# Patient Record
Sex: Female | Born: 1960 | Race: White | Hispanic: No | Smoking: Current every day smoker
Health system: Southern US, Community
[De-identification: ages and names within clinical notes are randomized; demographics above are authoritative.]

## PROBLEM LIST (undated history)

## (undated) DIAGNOSIS — D497 Neoplasm of unspecified behavior of endocrine glands and other parts of nervous system: Secondary | ICD-10-CM

## (undated) DIAGNOSIS — J449 Chronic obstructive pulmonary disease, unspecified: Secondary | ICD-10-CM

## (undated) DIAGNOSIS — E782 Mixed hyperlipidemia: Secondary | ICD-10-CM

## (undated) DIAGNOSIS — G4730 Sleep apnea, unspecified: Secondary | ICD-10-CM

## (undated) DIAGNOSIS — I10 Essential (primary) hypertension: Secondary | ICD-10-CM

## (undated) DIAGNOSIS — F319 Bipolar disorder, unspecified: Secondary | ICD-10-CM

## (undated) DIAGNOSIS — K589 Irritable bowel syndrome without diarrhea: Secondary | ICD-10-CM

## (undated) DIAGNOSIS — F4310 Post-traumatic stress disorder, unspecified: Secondary | ICD-10-CM

## (undated) DIAGNOSIS — C649 Malignant neoplasm of unspecified kidney, except renal pelvis: Secondary | ICD-10-CM

## (undated) DIAGNOSIS — C539 Malignant neoplasm of cervix uteri, unspecified: Secondary | ICD-10-CM

## (undated) DIAGNOSIS — E119 Type 2 diabetes mellitus without complications: Secondary | ICD-10-CM

## (undated) DIAGNOSIS — K219 Gastro-esophageal reflux disease without esophagitis: Secondary | ICD-10-CM

## (undated) HISTORY — PX: CHOLECYSTECTOMY: SHX55

## (undated) HISTORY — DX: Essential (primary) hypertension: I10

## (undated) HISTORY — DX: Sleep apnea, unspecified: G47.30

## (undated) HISTORY — PX: BACK SURGERY: SHX140

## (undated) HISTORY — PX: ADRENAL GLAND SURGERY: SHX544

## (undated) HISTORY — DX: Chronic obstructive pulmonary disease, unspecified: J44.9

## (undated) HISTORY — PX: SHOULDER SURGERY: SHX246

## (undated) HISTORY — DX: Malignant neoplasm of unspecified kidney, except renal pelvis: C64.9

## (undated) HISTORY — DX: Irritable bowel syndrome, unspecified: K58.9

## (undated) HISTORY — DX: Mixed hyperlipidemia: E78.2

## (undated) HISTORY — DX: Type 2 diabetes mellitus without complications: E11.9

## (undated) HISTORY — PX: NECK SURGERY: SHX720

## (undated) HISTORY — DX: Neoplasm of unspecified behavior of endocrine glands and other parts of nervous system: D49.7

## (undated) HISTORY — PX: TUBAL LIGATION: SHX77

## (undated) HISTORY — PX: OTHER SURGICAL HISTORY: SHX169

## (undated) HISTORY — DX: Post-traumatic stress disorder, unspecified: F43.10

## (undated) HISTORY — DX: Bipolar disorder, unspecified: F31.9

## (undated) HISTORY — DX: Gastro-esophageal reflux disease without esophagitis: K21.9

## (undated) HISTORY — PX: CERVICAL CONE BIOPSY: SUR198

---
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
## 1985-10-10 DIAGNOSIS — C539 Malignant neoplasm of cervix uteri, unspecified: Secondary | ICD-10-CM

## 1985-10-10 HISTORY — DX: Malignant neoplasm of cervix uteri, unspecified: C53.9

## 1998-04-05 ENCOUNTER — Ambulatory Visit (HOSPITAL_COMMUNITY): Admission: RE | Admit: 1998-04-05 | Discharge: 1998-04-05 | Payer: Self-pay | Admitting: Neurosurgery

## 1998-04-21 ENCOUNTER — Ambulatory Visit (HOSPITAL_COMMUNITY): Admission: RE | Admit: 1998-04-21 | Discharge: 1998-04-21 | Payer: Self-pay | Admitting: Neurosurgery

## 1998-07-21 ENCOUNTER — Inpatient Hospital Stay (HOSPITAL_COMMUNITY): Admission: RE | Admit: 1998-07-21 | Discharge: 1998-07-24 | Payer: Self-pay | Admitting: Neurosurgery

## 1998-07-21 ENCOUNTER — Encounter: Payer: Self-pay | Admitting: Neurosurgery

## 2003-05-23 ENCOUNTER — Ambulatory Visit (HOSPITAL_COMMUNITY): Admission: RE | Admit: 2003-05-23 | Discharge: 2003-05-23 | Payer: Self-pay | Admitting: Family Medicine

## 2007-04-05 ENCOUNTER — Ambulatory Visit (HOSPITAL_COMMUNITY): Admission: RE | Admit: 2007-04-05 | Discharge: 2007-04-05 | Payer: Self-pay | Admitting: Family Medicine

## 2009-01-13 ENCOUNTER — Ambulatory Visit (HOSPITAL_COMMUNITY): Admission: RE | Admit: 2009-01-13 | Discharge: 2009-01-13 | Payer: Self-pay | Admitting: Family Medicine

## 2009-05-26 ENCOUNTER — Emergency Department (HOSPITAL_COMMUNITY): Admission: EM | Admit: 2009-05-26 | Discharge: 2009-05-26 | Payer: Self-pay | Admitting: Emergency Medicine

## 2010-01-07 ENCOUNTER — Ambulatory Visit: Payer: Self-pay | Admitting: Cardiology

## 2010-05-31 ENCOUNTER — Ambulatory Visit: Admission: RE | Admit: 2010-05-31 | Discharge: 2010-05-31 | Payer: Self-pay | Admitting: Neurology

## 2011-01-15 LAB — RAPID URINE DRUG SCREEN, HOSP PERFORMED
Amphetamines: NOT DETECTED
Barbiturates: NOT DETECTED
Opiates: NOT DETECTED
Tetrahydrocannabinol: NOT DETECTED

## 2011-01-15 LAB — BASIC METABOLIC PANEL
BUN: 5 mg/dL — ABNORMAL LOW (ref 6–23)
CO2: 29 mEq/L (ref 19–32)
Calcium: 9.6 mg/dL (ref 8.4–10.5)
Chloride: 102 mEq/L (ref 96–112)
Creatinine, Ser: 0.82 mg/dL (ref 0.4–1.2)
GFR calc Af Amer: >60 mL/min (ref >60)
GFR calc non Af Amer: >60 mL/min (ref >60)
Glucose, Bld: 95 mg/dL (ref 70–99)
Potassium: 3.3 mEq/L — ABNORMAL LOW (ref 3.5–5.1)
Sodium: 137 mEq/L (ref 135–145)

## 2011-01-15 LAB — CBC
HCT: 40.9 % (ref 36.0–46.0)
MCHC: 34.5 g/dL (ref 30.0–36.0)
MCV: 87.5 fL (ref 78.0–100.0)
Platelets: 295 10*3/uL (ref 150–400)

## 2011-01-15 LAB — DIFFERENTIAL
Basophils Relative: 0 % (ref 0–1)
Eosinophils Absolute: 0.2 10*3/uL (ref 0.0–0.7)
Eosinophils Relative: 2 % (ref 0–5)
Monocytes Relative: 5 % (ref 3–12)
Neutrophils Relative %: 67 % (ref 43–77)

## 2011-01-15 LAB — ETHANOL: Alcohol, Ethyl (B): <5 mg/dL (ref 0–10)

## 2011-06-27 ENCOUNTER — Ambulatory Visit: Payer: Medicaid Other | Attending: Neurology | Admitting: Sleep Medicine

## 2011-06-27 DIAGNOSIS — G4710 Hypersomnia, unspecified: Secondary | ICD-10-CM | POA: Insufficient documentation

## 2011-06-27 DIAGNOSIS — G4730 Sleep apnea, unspecified: Secondary | ICD-10-CM | POA: Insufficient documentation

## 2011-06-27 DIAGNOSIS — Z6841 Body Mass Index (BMI) 40.0 and over, adult: Secondary | ICD-10-CM | POA: Insufficient documentation

## 2011-07-03 NOTE — Procedures (Signed)
NAME:  Kimberly Green, Kimberly Green            ACCOUNT NO.:  [id number]  MEDICAL RECORD NO.:  [id number]          PATIENT TYPE:  OUT  LOCATION:  SLEEP LAB                     FACILITY:  [facility]  PHYSICIAN:  Mellony Danziger A. Gerilyn Pilgrim, M.D. DATE OF BIRTH:  1961-09-27  DATE OF STUDY:                           NOCTURNAL POLYSOMNOGRAM  REFERRING PHYSICIAN:  Kmya Placide A. Gerilyn Pilgrim, M.D.  INDICATIONS:  A 50 year old lady who presents with hypersomnia, fatigue, snoring and difficulty sleeping.   EPWORTH SLEEPINESS SCORE: 1. BMI 43.  ARCHITECTURAL SUMMARY:  The total recording time is 419.5 minutes. Sleep efficiency 79.6%, sleep latency 41 minutes, REM latency 351.5 minutes.  Stage N1 19.9%, N2 73.4%, N3 0% and REM sleep 6.7%.  RESPIRATORY SUMMARY:  The patient had significantly elevated average heart rate 6 bpm throughout the study with a average heart rate of 34. Baseline oxygen saturation 95, lowest saturation 85 during REM sleep. Diagnostic AHI 2.3 and RDI 2.7.  LIMB MOVEMENT SUMMARY:  PLM index 0.  ELECTROCARDIOGRAM SUMMARY:  Average heart rate is 90 with no significant dysrhythmias observed.  MEDICATIONS:  Clonazepam, Advil, Prozac, albuterol, potassium, hydrochlorothiazide, pravastatin, lisinopril, Protonix, Flovent, Seroquel, spiriva and Invega.  IMPRESSIONS-RECOMMENDATIONS:  Tachypnea seen throughout the study along with increased heart rate.  Both are seen throughout the recording and are of unclear etiology.  Potential causes could be medication effect or underlying dysautonomia.  Otherwise, no sleep disorders uncovered during this recording.     Hadiyah Maricle A. Gerilyn Pilgrim, M.D.    KAD/MEDQ  D:  07/02/2011 22:35:54  T:  07/03/2011 07:01:39  Job:  [id number]

## 2011-07-05 ENCOUNTER — Encounter: Payer: Self-pay | Admitting: *Deleted

## 2011-07-06 ENCOUNTER — Encounter: Payer: Self-pay | Admitting: *Deleted

## 2011-07-06 ENCOUNTER — Ambulatory Visit (INDEPENDENT_AMBULATORY_CARE_PROVIDER_SITE_OTHER): Payer: Medicaid Other | Admitting: Physician Assistant

## 2011-07-06 ENCOUNTER — Other Ambulatory Visit: Payer: Self-pay | Admitting: Cardiology

## 2011-07-06 ENCOUNTER — Encounter: Payer: Self-pay | Admitting: Cardiology

## 2011-07-06 ENCOUNTER — Telehealth: Payer: Self-pay | Admitting: *Deleted

## 2011-07-06 VITALS — BP 108/67 | HR 91 | Ht 64.0 in | Wt 246.0 lb

## 2011-07-06 DIAGNOSIS — R002 Palpitations: Secondary | ICD-10-CM

## 2011-07-06 DIAGNOSIS — R079 Chest pain, unspecified: Secondary | ICD-10-CM | POA: Insufficient documentation

## 2011-07-06 DIAGNOSIS — R0609 Other forms of dyspnea: Secondary | ICD-10-CM | POA: Insufficient documentation

## 2011-07-06 NOTE — Progress Notes (Signed)
Patient seen and examined with [name]. She reports a history of dyspnea on exertion as well as occasional exertional chest pain, also palpitations, as described below. Cardiac risk factors include obesity, long-standing tobacco abuse, hypertension, hyperlipidemia, and family history of CAD. She is being considered for possible elective cholecystectomy with Dr. Cristy Folks. Plan at this time is to proceed with a 2-D echocardiogram as well as Cardiolite to assess cardiac structural function as well as for underlying ischemia. Cardiac monitor will also be provided to further investigate palpitations. Based on these tests, can determine whether additional cardiovascular evaluation is necessary, versus more aggressive efforts at risk factor modification to reduce her risk of adverse cardiac events the long-term. We discussed the importance of complete smoking cessation.  We will comment further on her perioperative risk when results of testing are available.

## 2011-07-06 NOTE — Telephone Encounter (Signed)
stress echocardiogram Dobutamine  Scheduled for 07-12-2011 @ [facility]

## 2011-07-06 NOTE — Progress Notes (Signed)
HPI: Patient is a 49 year old female, morbidly obese, with no known h/o CAD, now referred to Dr Diona Browner for cardiac evaluation.  She presents with CRFs notable for HTN, HLD, longstanding tobacco smoking, and FH. She has never undergone an ischemic evaluation, but had a 2D echocardiogram in March 2011, here at [facility], indicating an EF of 65%, with no focal WMAs, and mild MR.  Patient also reports having undergone bilateral kidney surgery last year, at [facility], without any formal cardiac evaluation. She underwent surgery with general anesthesia in each case, was kept for overnight observation, and did not experience any complications.  With respect to symptoms, she suggests a long-standing history of occasional palpitations, with a negative 24 hour Holter monitor, some 10 years ago. These are brief in duration, lasting only a minute, or so. There is some associated dizziness, but no frank syncope. They occur perhaps one to 2 times a week. She reports a sensation of feeling like her "head is full of hornets". She aslo suggests a long-standing history of DOE, with associated chest pressure when walking uphill. However, there does not appear to be any significant change from her baseline.  Patient also points out that she recently underwent evaluation for possible gallbladder surgery, but that this was placed on hold, pending further evaluation of her multiple medical issues.  Allergies  Allergen Reactions  . Codeine Rash    Current Outpatient Prescriptions on File Prior to Visit  Medication Sig Dispense Refill  . albuterol (PROVENTIL) (2.5 MG/3ML) 0.083% nebulizer solution Take 2.5 mg by nebulization every 6 (six) hours as needed.        . clonazePAM (KLONOPIN) 2 MG tablet Take 2 mg by mouth 3 (three) times daily as needed.        Marland Kitchen FLUoxetine HCl 60 MG TABS Take 60 mg by mouth daily.        . fluticasone (FLOVENT HFA) 110 MCG/ACT inhaler Inhale 2 puffs into the lungs 2 (two)  times daily.        Marland Kitchen lisinopril-hydrochlorothiazide (PRINZIDE,ZESTORETIC) 20-12.5 MG per tablet Take 1 tablet by mouth daily.        . paliperidone (INVEGA) 3 MG 24 hr tablet Take 3 mg by mouth every morning.        . potassium chloride SA (K-DUR,KLOR-CON) 20 MEQ tablet Take 20 mEq by mouth daily.       . pravastatin (PRAVACHOL) 40 MG tablet Take 40 mg by mouth daily.        . QUEtiapine (SEROQUEL) 300 MG tablet Take 300 mg by mouth at bedtime.          Past Medical History  Diagnosis Date  . Essential hypertension, benign   . GERD (gastroesophageal reflux disease)   . Asthma   . Bipolar disorder   . PTSD (post-traumatic stress disorder)   . COPD (chronic obstructive pulmonary disease)   . Cholelithiasis   . Mixed hyperlipidemia   . Renal carcinoma     S/P partial R nephrectomy, 8/11 [facility])  . Adrenal tumor, benign     S/P L adrenal resection, 10/11 [facility])    Past Surgical History  Procedure Date  . Tubal ligation   . Back surgery   . Right wrist surgery   . Neck surgery   . Kidney mass removal     History   Social History  . Marital Status: Divorced    Spouse Name: N/A    Number of Children: N/A  . Years  of Education: N/A   Occupational History  . Not on file.   Social History Main Topics  . Smoking status: Current Everyday Smoker -- 0.5 packs/day for 38 years    Types: Cigarettes  . Smokeless tobacco: Never Used  . Alcohol Use: No  . Drug Use: No  . Sexually Active: Not on file   Other Topics Concern  . Not on file   Social History Narrative  . No narrative on file    Family History  Problem Relation Age of Onset  . Coronary artery disease Mother 81  . Heart failure Father 63  . Colon cancer Other     Aunt  . Lung cancer Other     Aunt    ROS: Negative for orthopnea, PND, lower extremity edema, presyncope/syncope, claudication, reflux, hematuria, hematochezia, or melena. Denies h/o diabetes. Remaining systems reviewed, and are  negative.   PHYSICAL EXAM:  BP 108/67  Pulse 91  Ht 5\' 4"  (1.626 m)  Wt 246 lb (111.585 kg)  BMI 42.23 kg/m2  GENERAL: 50 yof, morbidly obese; NAD HEENT: NCAT, PERRLA, EOMI; sclera clear; no xanthelasma NECK: palpable bilateral carotid pulses, no bruits; unable to assess JVD, secondary to neck girth LUNGS: CTA bilaterally CARDIAC: RRR (S1, S2); no significant murmurs; no rubs or gallops ABDOMEN: protuberant; intact BS EXTREMETIES: intact left dorsalis pulses, non palpable on the right; 1+ peripheral edema SKIN: warm/dry; no obvious rash/lesions MUSCULOSKELETAL: no joint deformity NEURO: no focal deficit; NL affect   EKG:  Recent study from [facility] hospital, 6/12:  NSR at 76 bpm; normal axis; no ischemic changes.    ASSESSMENT & PLAN:

## 2011-07-06 NOTE — Assessment & Plan Note (Signed)
Will proceed with an ischemic evaluation with a dobutamine stress echocardiogram, for risk ratification. If this is normal, then no further cardiac workup is recommended, and patient can resume routine followup with her primary care team. If, however, there is any suggestion of ischemia, then we will have the patient return to the clinic to discuss possible cardiac catheterization, for definitive exclusion of significant CAD.

## 2011-07-06 NOTE — Assessment & Plan Note (Signed)
We'll order a 21 day monitor to rule out dysrhythmia or significant bradycardia. Patient reports having had a negative 24-hour Holter monitor, over 10 years ago. We'll also check a metabolic profile and TSH level, to rule out metabolic abnormalities.

## 2011-07-06 NOTE — Assessment & Plan Note (Signed)
Will repeat a 2-D echocardiogram for reassessment of LVF, and rule out of any underlying structural abnormality. Patient had normal LVF, with mild MR, by previous study, 3/11.

## 2011-07-06 NOTE — Patient Instructions (Signed)
Your physician has requested that you have an echocardiogram. Echocardiography is a painless test that uses sound waves to create images of your heart. It provides your doctor with information about the size and shape of your heart and how well your heart's chambers and valves are working. This procedure takes approximately one hour. There are no restrictions for this procedure.  Your physician has requested that you have a stress echocardiogram. For further information please visit [email or web address] Please follow instruction sheet as given.  Your physician has recommended that you wear an event monitor. Event monitors are medical devices that record the heart's electrical activity. Doctors most often [place] these monitors to diagnose arrhythmias. Arrhythmias are problems with the speed or rhythm of the heartbeat. The monitor is a small, portable device. You can wear one while you do your normal daily activities. This is usually used to diagnose what is causing palpitations/syncope (passing out).  Your physician recommends that you return for lab work in: TODAY AT THE [facility]

## 2011-07-06 NOTE — Telephone Encounter (Signed)
Pt has Medicaid only, no precert required.

## 2011-07-13 DIAGNOSIS — R002 Palpitations: Secondary | ICD-10-CM

## 2011-07-20 ENCOUNTER — Other Ambulatory Visit: Payer: Medicaid Other | Admitting: *Deleted

## 2011-08-01 ENCOUNTER — Telehealth: Payer: Self-pay | Admitting: *Deleted

## 2011-08-01 NOTE — Telephone Encounter (Signed)
Attempting to reach pt. We received notified notification from Cardionet monitor. However, there is a great deal of artifact on this reading. We need to ensure that pt sends readings through land-line phone and not cell phone.  Pt's home number is temporarily out of service and cell phone was no answer and no voicemail.

## 2011-08-08 ENCOUNTER — Encounter: Payer: Self-pay | Admitting: *Deleted

## 2011-08-09 ENCOUNTER — Telehealth: Payer: Self-pay | Admitting: *Deleted

## 2011-08-09 NOTE — Telephone Encounter (Signed)
Kimberly Green called today requesting that I re-schedule her 2 D Echo and Dobutamine Echo at [facility]. States that she now has a new telephone # [phone]. This is also a cell phone #. She states that Her neighbor has a land line but he is in hospital with cancer and that it is hard to catch the family at home. I have re-scheduled her test for November 12,2012. Kimberly Green has been notified of date for testing.

## 2011-08-09 NOTE — Telephone Encounter (Signed)
Checking percert for 2 D Echo and Dobutamine Echo Scheduled for 08-22-2011 @ [facility]

## 2011-08-09 NOTE — Telephone Encounter (Signed)
No precert required 

## 2011-08-10 NOTE — Telephone Encounter (Signed)
Kimberly Green spoke with pt. She is aware but states her neighbor is the only person she knows with a land line phone. She will try to use his but he has cancer so this may be difficult.

## 2011-08-22 DIAGNOSIS — R079 Chest pain, unspecified: Secondary | ICD-10-CM

## 2011-08-23 ENCOUNTER — Telehealth: Payer: Self-pay | Admitting: *Deleted

## 2011-08-23 NOTE — Telephone Encounter (Signed)
Message copied by Arlyss Gandy on Tue Aug 23, 2011  4:49 PM ------      Message from: Jonelle Sidle      Created: Tue Aug 23, 2011  4:27 PM       Reviewed report. Dobutamine echocardiogram is negative for ischemia by ECG and echocardiographic criteria. This would suggest that she could proceed with possible elective cholecystectomy at an acceptable perioperative cardiac risk. Would generally recommend risk factor modification, and followup with her primary care physician.

## 2011-08-23 NOTE — Telephone Encounter (Signed)
No answer, no voicemail.

## 2011-08-24 NOTE — Telephone Encounter (Signed)
No answer, no voicemail. Pt's phone goes straight to message stating wireless customer is unavailable but no voicemail. Letter mailed to pt regarding results.

## 2011-08-24 NOTE — Telephone Encounter (Signed)
Results and notes faxed to Drs. Beacham and Howard.

## 2011-10-06 ENCOUNTER — Telehealth: Payer: Self-pay | Admitting: Cardiology

## 2011-10-06 NOTE — Telephone Encounter (Signed)
Patient is scheduled to have gallbladder surgery and seeing Dr. Teena Dunk for a colonoscopy.  Due to anesthesia, they need results from Cardiac testing that was done.  Patient also requested copies for herself, advised we could not send to her, she would have to provide written request and there would be a charge for them.  Dr. Teena Dunk [phone].  [facility] [address] [place], [place] fax#[phone]  If you need to speak to patient she is at a friends house [phone].  Her mobile number is not working.

## 2011-10-10 ENCOUNTER — Encounter: Payer: Self-pay | Admitting: *Deleted

## 2013-02-14 ENCOUNTER — Other Ambulatory Visit (HOSPITAL_COMMUNITY): Payer: Self-pay | Admitting: *Deleted

## 2013-02-14 DIAGNOSIS — Z139 Encounter for screening, unspecified: Secondary | ICD-10-CM

## 2013-02-26 ENCOUNTER — Ambulatory Visit (HOSPITAL_COMMUNITY): Payer: Medicaid Other

## 2013-03-15 ENCOUNTER — Ambulatory Visit (HOSPITAL_COMMUNITY)
Admission: RE | Admit: 2013-03-15 | Discharge: 2013-03-15 | Disposition: A | Discharge status: Home / Self Care | Payer: Medicaid Other | Source: Ambulatory Visit | Attending: *Deleted | Admitting: *Deleted

## 2013-03-15 DIAGNOSIS — Z139 Encounter for screening, unspecified: Secondary | ICD-10-CM

## 2013-03-15 DIAGNOSIS — Z1231 Encounter for screening mammogram for malignant neoplasm of breast: Secondary | ICD-10-CM | POA: Insufficient documentation

## 2015-12-22 ENCOUNTER — Ambulatory Visit: Payer: Self-pay | Admitting: Family

## 2015-12-24 ENCOUNTER — Encounter: Payer: Self-pay | Admitting: Pediatrics

## 2015-12-24 ENCOUNTER — Ambulatory Visit (INDEPENDENT_AMBULATORY_CARE_PROVIDER_SITE_OTHER): Payer: Medicaid Other | Admitting: Pediatrics

## 2015-12-24 VITALS — BP 111/69 | HR 87 | Temp 97.9°F | Ht 64.0 in | Wt 267.4 lb

## 2015-12-24 DIAGNOSIS — R635 Abnormal weight gain: Secondary | ICD-10-CM

## 2015-12-24 DIAGNOSIS — R5383 Other fatigue: Secondary | ICD-10-CM | POA: Diagnosis not present

## 2015-12-24 DIAGNOSIS — F3160 Bipolar disorder, current episode mixed, unspecified: Secondary | ICD-10-CM

## 2015-12-24 DIAGNOSIS — J449 Chronic obstructive pulmonary disease, unspecified: Secondary | ICD-10-CM | POA: Diagnosis not present

## 2015-12-24 DIAGNOSIS — Z1211 Encounter for screening for malignant neoplasm of colon: Secondary | ICD-10-CM

## 2015-12-24 DIAGNOSIS — E896 Postprocedural adrenocortical (-medullary) hypofunction: Secondary | ICD-10-CM | POA: Diagnosis not present

## 2015-12-24 DIAGNOSIS — N2889 Other specified disorders of kidney and ureter: Secondary | ICD-10-CM | POA: Diagnosis not present

## 2015-12-24 LAB — BAYER DCA HB A1C WAIVED: HB A1C: 6.7 % (ref <7.0)

## 2015-12-24 MED ORDER — TIOTROPIUM BROMIDE MONOHYDRATE 18 MCG IN CAPS: 18.0000 ug | ORAL_CAPSULE | Freq: Every day | RESPIRATORY_TRACT | Status: DC

## 2015-12-24 NOTE — Progress Notes (Signed)
Subjective:    Patient ID: Kimberly Green, female    DOB: 09-20-1961, 54 y.o.   MRN: [id number]  CC: New Patient (Initial Visit) follow up multiple med problems  HPI: Kimberly Green is a 55 y.o. female presenting for New Patient (Initial Visit)  Gall stones and cholecystectomy at 55yo  Kidney cancer: found at 55yo during gall bladder eval. 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 Not able to access records through care everywhere, scan was done at [facility] in [place].  COPD: has had pneumonia before. Was on advair and albuterol, then switched to spiriva.  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 Two daughters with drug problems  Went to rehab in [place] [facility], says she lied to get into the rehab place because she was homeless  Weight gain over 2 yrs, per pt 150 lbs She thinks related to adrenal gland problems  Now depression is the most bothersome Was on seroquel, adderall and klonopin and she says it helped in the past, wants referral back to [facility] where she was previously getting treatment Feels safe at home Has had 4 years of depression, improved somewhat currently from its worst, no thoughts of hurting herself or anyone else Takes care of 53yo and 74 yo granddaughters, kinship care, through social services, mother with drug problem  Had two sleep studies done 4 years ago. Pt says  she was told they were borderline   Depression screen [facility] 2/9 12/24/2015  Decreased Interest 3  Down, Depressed, Hopeless 3  PHQ - 2 Score 6  Altered sleeping 3  Tired, decreased energy 3  Change in appetite 3  Feeling bad or failure about yourself  3  Trouble concentrating 3  Moving slowly or fidgety/restless 3  Suicidal thoughts 3  PHQ-9 Score 27  Difficult doing work/chores Very difficult     ROS: All systems negative other than what is in HPI    Past Medical History  Diagnosis Date  . Essential hypertension, benign   . GERD (gastroesophageal reflux disease)   . Asthma   . Bipolar disorder ([place])   . PTSD (post-traumatic stress disorder)   . COPD (chronic obstructive pulmonary disease) ([place])   . Cholelithiasis   . Mixed hyperlipidemia   . Renal carcinoma ([place])     S/P partial R nephrectomy, 8/11 ([place])  . Adrenal tumor, benign     S/P L adrenal resection, 10/11 [facility])     Social History   Social History  . Marital Status: Divorced    Spouse Name: N/A  . Number of Children: N/A  . Years of Education: N/A   Occupational History  . Not on file.   Social History Main Topics  . Smoking status: Current Every Day Smoker -- 0.50 packs/day for 38 years    Types: Cigarettes  . Smokeless tobacco: Never Used  . Alcohol Use: No  . Drug  Use: No  . Sexual Activity: Not on file   Other Topics Concern  . Not on file   Social History Narrative   Family History  Problem Relation Age of Onset  . Coronary artery disease Mother 81  . Heart failure Father 42  . Colon cancer Other     Aunt  . Lung cancer Other     Aunt     Current Outpatient Prescriptions  Medication Sig Dispense Refill  . albuterol (PROVENTIL) (2.5 MG/3ML) 0.083% nebulizer solution Take 2.5 mg by nebulization every 6 (six) hours as needed.      Marland Kitchen QUEtiapine (SEROQUEL) 300 MG tablet Take 300 mg by mouth at bedtime.      Marland Kitchen tiotropium (SPIRIVA HANDIHALER) 18 MCG inhalation capsule Place 1 capsule  (18 mcg total) into inhaler and inhale daily. 30 capsule 12   No current facility-administered medications for this visit.       Objective:    BP 111/69 mmHg  Pulse 87  Temp(Src) 97.9 F (36.6 C) (Oral)  Ht '5\' 4"'$  (1.626 m)  Wt 267 lb 6.4 oz (121.292 kg)  BMI 45.88 kg/m2  LMP 10/15/2015  Wt Readings from Last 3 Encounters:  12/24/15 267 lb 6.4 oz (121.292 kg)  07/06/11 246 lb (111.585 kg)    Gen: NAD, alert, cooperative with exam, NCAT EYES: EOMI, no scleral injection or icterus ENT:  TMs pearly gray b/l, OP without erythema LYMPH: no cervical LAD CV: NRRR, normal S1/S2, no murmur, distal pulses 2+ b/l Resp: moving air well, few scattered wheezes not with every breath, normal WOB Abd: +BS, soft, mildly tender throughout with palpation, ND. no guarding or organomegaly Ext: 1+ pitting edema present over shins b/l, warm Neuro: Alert and oriented, strength equal b/l UE and LE, coordination grossly normal MSK: normal muscle bulk     Assessment & Plan:    [name] was seen today for new patient (initial visit), follow up multiple med problems.  Diagnoses and all orders for this visit:  Bipolar affective disorder, current episode mixed, current episode severity unspecified ([place]) Per pt symptoms under ok control now, she is able to do what she needs to to care for herself and granddaughters, feels safe at home, but symptoms have been better in the past. Wants to get back on previous medications. -     Ambulatory referral to Psychiatry  Renal mass, right New mass in scar tissue of previous surgery for renal cancer per pt. Pt says she was told was not a cancer based on CT scan imaging but needs follow up.  -     Ambulatory referral to Urology  Chronic obstructive pulmonary disease, unspecified COPD type ([place]) Continue albuterol, restart spiriva. PFTs in future. -     tiotropium (SPIRIVA HANDIHALER) 18 MCG inhalation capsule; Place 1 capsule (18 mcg total) into inhaler and inhale  daily.  Other fatigue -     TSH  H/O partial adrenalectomy -     CMP14+EGFR -     CBC with Differential -     Cortisol  Weight gain -     Bayer DCA Hb A1c Waived  Screen for colon cancer -     Fecal occult blood, imunochemical; Future  Snoring Will readdress sleep apnea symptoms in future. Pt does not want a repeat sleep study now.  Follow up plan: Return in about 3 months (around 03/25/2016).  Assunta Found, MD [facility] 12/24/2015, 11:48 AM

## 2015-12-25 LAB — CBC WITH DIFFERENTIAL/PLATELET
BASOS: 0 %
Basophils Absolute: 0 10*3/uL (ref 0.0–0.2)
EOS (ABSOLUTE): 0.2 10*3/uL (ref 0.0–0.4)
EOS: 2 %
HEMATOCRIT: 42.6 % (ref 34.0–46.6)
HEMOGLOBIN: 14.3 g/dL (ref 11.1–15.9)
IMMATURE GRANS (ABS): 0.1 10*3/uL (ref 0.0–0.1)
IMMATURE GRANULOCYTES: 1 %
LYMPHS: 30 %
Lymphocytes Absolute: 2.8 10*3/uL (ref 0.7–3.1)
MCH: 28.9 pg (ref 26.6–33.0)
MCHC: 33.6 g/dL (ref 31.5–35.7)
MCV: 86 fL (ref 79–97)
Monocytes Absolute: 0.5 10*3/uL (ref 0.1–0.9)
Monocytes: 6 %
NEUTROS ABS: 5.8 10*3/uL (ref 1.4–7.0)
NEUTROS PCT: 61 %
PLATELETS: 257 10*3/uL (ref 150–379)
RBC: 4.95 x10E6/uL (ref 3.77–5.28)
RDW: 14.2 % (ref 12.3–15.4)
WBC: 9.3 10*3/uL (ref 3.4–10.8)

## 2015-12-25 LAB — CMP14+EGFR
A/G RATIO: 1.4 (ref 1.2–2.2)
ALT: 22 IU/L (ref 0–32)
AST: 15 IU/L (ref 0–40)
Albumin: 4 g/dL (ref 3.5–5.5)
Alkaline Phosphatase: 90 IU/L (ref 39–117)
BUN/Creatinine Ratio: 11 (ref 9–23)
BUN: 9 mg/dL (ref 6–24)
Bilirubin Total: 0.3 mg/dL (ref 0.0–1.2)
CALCIUM: 9.5 mg/dL (ref 8.7–10.2)
CO2: 25 mmol/L (ref 18–29)
CREATININE: 0.83 mg/dL (ref 0.57–1.00)
Chloride: 101 mmol/L (ref 96–106)
GFR, EST AFRICAN AMERICAN: 93 mL/min/{1.73_m2} (ref >59)
GFR, EST NON AFRICAN AMERICAN: 81 mL/min/{1.73_m2} (ref >59)
GLOBULIN, TOTAL: 2.8 g/dL (ref 1.5–4.5)
Glucose: 109 mg/dL — ABNORMAL HIGH (ref 65–99)
POTASSIUM: 4.4 mmol/L (ref 3.5–5.2)
Sodium: 140 mmol/L (ref 134–144)
TOTAL PROTEIN: 6.8 g/dL (ref 6.0–8.5)

## 2015-12-25 LAB — TSH: TSH: 3.03 u[IU]/mL (ref 0.450–4.500)

## 2015-12-25 LAB — CORTISOL: Cortisol: 4.7 ug/dL

## 2015-12-30 ENCOUNTER — Telehealth: Payer: Self-pay | Admitting: Pediatrics

## 2015-12-30 DIAGNOSIS — E896 Postprocedural adrenocortical (-medullary) hypofunction: Secondary | ICD-10-CM

## 2015-12-30 DIAGNOSIS — E119 Type 2 diabetes mellitus without complications: Secondary | ICD-10-CM

## 2015-12-30 MED ORDER — METFORMIN HCL 500 MG PO TABS: 250.0000 mg | ORAL_TABLET | Freq: Two times a day (BID) | ORAL | Status: DC

## 2015-12-30 NOTE — Telephone Encounter (Signed)
Pt with new diagnosis of diabetes, hgA1c 6.7, called pt to discuss. Starting metformin, start with half a pill BID. Pt had multiple lifestyle changes she could think of immediately to change including cutting out the daily 2L of regular Pepsi. F/u in 3 months. Will repeat cortisol in AM, also have lipid panel drawn when convenient for pt.

## 2016-03-28 ENCOUNTER — Ambulatory Visit: Payer: Medicaid Other | Admitting: Family

## 2016-03-29 ENCOUNTER — Encounter: Payer: Self-pay | Admitting: Pediatrics

## 2016-05-05 ENCOUNTER — Telehealth: Payer: Self-pay | Admitting: Pediatrics

## 2016-09-28 ENCOUNTER — Encounter (INDEPENDENT_AMBULATORY_CARE_PROVIDER_SITE_OTHER): Payer: Self-pay

## 2016-09-28 ENCOUNTER — Encounter: Payer: Self-pay | Admitting: Pediatrics

## 2016-09-28 ENCOUNTER — Ambulatory Visit (INDEPENDENT_AMBULATORY_CARE_PROVIDER_SITE_OTHER): Payer: Medicaid Other | Admitting: Pediatrics

## 2016-09-28 VITALS — BP 107/68 | HR 102 | Temp 97.1°F | Resp 22 | Ht 64.0 in | Wt 261.2 lb

## 2016-09-28 DIAGNOSIS — M25532 Pain in left wrist: Secondary | ICD-10-CM

## 2016-09-28 DIAGNOSIS — J42 Unspecified chronic bronchitis: Secondary | ICD-10-CM | POA: Diagnosis not present

## 2016-09-28 DIAGNOSIS — R0602 Shortness of breath: Secondary | ICD-10-CM

## 2016-09-28 DIAGNOSIS — J449 Chronic obstructive pulmonary disease, unspecified: Secondary | ICD-10-CM

## 2016-09-28 DIAGNOSIS — N2889 Other specified disorders of kidney and ureter: Secondary | ICD-10-CM | POA: Diagnosis not present

## 2016-09-28 DIAGNOSIS — R739 Hyperglycemia, unspecified: Secondary | ICD-10-CM

## 2016-09-28 MED ORDER — CETIRIZINE HCL 10 MG PO TABS: 10.0000 mg | ORAL_TABLET | Freq: Every day | ORAL | 11 refills | Status: DC

## 2016-09-28 MED ORDER — TIOTROPIUM BROMIDE MONOHYDRATE 18 MCG IN CAPS: 18.0000 ug | ORAL_CAPSULE | Freq: Every day | RESPIRATORY_TRACT | 12 refills | Status: DC

## 2016-09-28 NOTE — Progress Notes (Signed)
Subjective:   Patient ID: Kimberly Green, female    DOB: 03-04-1961, 55 y.o.   MRN: [id number] CC: Referral (Nephrology-Dr. Clerance Lav, Pulmonologist, [facility])  HPI: Kimberly Green is a 55 y.o. female presenting for Referral (Nephrology-Dr. Hamal, Pulmonologist, [facility])  Returns to clinic, last seen 9 months ago for new pt visit with multiple med problems needing follow up  Says since then has been seen multiple times at [facility] ED for COPD exacerbations last few months, has been put on prednisone and abx multiple times. 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  Referral placed back to Dr. Luane School pt's urologist last visit, never followed up. Says she will lose medicaid if she doesn't get referral today  Says appetite has been fine, multiple stressors at home from daughters, son-in-laws  Elevated BMI: Cutting back on sugary foods since diabetes diagnosis last visit Never started metformin  Tobacco: two cig a day White sputum regulalry Interested in quitting, continues to cut back  L wrist has been swelling off and on for a couple of months Notices it most prominently in the morning By the afternoon swelling often improves L hand feels very tight in the morning, cant make fist with hand in the morning, Fingers feel stiff Thinks sometimes L hand look sbigger and swollen compared with R Denies redness No other joint swelling Has lower leg swelling sometimes, keeps feet propped up  Relevant past medical, surgical, family and social history reviewed. Allergies and medications reviewed and updated. History  Smoking Status  . Current Every Day  Smoker  . Packs/day: 0.50  . Years: 38.00  . Types: Cigarettes  Smokeless Tobacco  . Never Used   ROS: Per HPI   Objective:    BP 107/68   Pulse (!) 102   Temp 97.1 F (36.2 C) (Oral)   Resp (!) 22   Ht 5\' 4"  (1.626 m)   Wt 261 lb 3.2 oz (118.5 kg)   SpO2 96%   BMI 44.83 kg/m   Wt Readings from Last 3 Encounters:  09/28/16 261 lb 3.2 oz (118.5 kg)  12/24/15 267 lb 6.4 oz (121.3 kg)  07/06/11 246 lb (111.6 kg)    Gen: NAD, alert, cooperative with exam, NCAT EYES: EOMI, no conjunctival injection, or no icterus ENT:  TMs pearly gray b/l, OP without erythema LYMPH: no cervical LAD CV: NRRR, normal S1/S2, no murmur Resp: CTABL, no wheezes, moving air well, normal  WOB Abd: +BS, soft, NTND. no guarding or organomegaly Ext: 1+pitting edema b/l lower shin, warm Neuro: Alert and oriented MSK: L wrist normal to inspection, no swelling, no redness, decreased ROM compared with R  Assessment & Plan:  07/08/11 was seen today for referral, mulitple med problem f/u.  Diagnoses and all orders for this visit:  Renal mass Referral placed last visit, pt says she didn't follow up with Dr. Margaretha Glassing. Placing referral again. -     Ambulatory referral to Urology  Hyperglycemia -     Bayer DCA Hb A1c Waived -     CMP14+EGFR  SOB (shortness of breath) Mulitple recent COPD exacerbations Will get PFTs Restart spiriva Cont albuterol  Chronic bronchitis,  unspecified chronic bronchitis type ([place]) -     PR BREATHING CAPACITY TEST  Chronic obstructive pulmonary disease, unspecified COPD type ([place]) -     tiotropium (SPIRIVA HANDIHALER) 18 MCG inhalation capsule; Place 1 capsule (18 mcg total) into inhaler and inhale daily.  Left wrist pain Multiple family members with RA Swelling lasts for much of the day Present for past 2 months Will get xray, send below -     Rheumatoid factor -     CYCLIC CITRUL PEPTIDE ANTIBODY, IGG/IGA -     ANA  Other orders -     cetirizine (ZYRTEC) 10 MG  tablet; Take 1 tablet (10 mg total) by mouth daily.   Follow up plan: Return in about 3 weeks (around 10/19/2016) for med prob follow up. Assunta Found, MD [place]

## 2016-10-19 ENCOUNTER — Ambulatory Visit (INDEPENDENT_AMBULATORY_CARE_PROVIDER_SITE_OTHER): Payer: Medicaid Other | Admitting: Pediatrics

## 2016-10-19 ENCOUNTER — Encounter: Payer: Self-pay | Admitting: Pediatrics

## 2016-10-19 VITALS — BP 126/70 | HR 92 | Temp 98.0°F | Resp 22 | Ht 64.0 in | Wt 261.4 lb

## 2016-10-19 DIAGNOSIS — M25432 Effusion, left wrist: Secondary | ICD-10-CM

## 2016-10-19 DIAGNOSIS — J441 Chronic obstructive pulmonary disease with (acute) exacerbation: Secondary | ICD-10-CM

## 2016-10-19 DIAGNOSIS — J329 Chronic sinusitis, unspecified: Secondary | ICD-10-CM | POA: Diagnosis not present

## 2016-10-19 DIAGNOSIS — E119 Type 2 diabetes mellitus without complications: Secondary | ICD-10-CM

## 2016-10-19 DIAGNOSIS — Z23 Encounter for immunization: Secondary | ICD-10-CM

## 2016-10-19 LAB — BAYER DCA HB A1C WAIVED: HB A1C (BAYER DCA - WAIVED): 12.3 % — ABNORMAL HIGH (ref <7.0)

## 2016-10-19 MED ORDER — FLUTICASONE PROPIONATE 50 MCG/ACT NA SUSP: 2.0000 | Freq: Every day | NASAL | 6 refills | Status: DC

## 2016-10-19 MED ORDER — CETIRIZINE HCL 10 MG PO TABS: 10.0000 mg | ORAL_TABLET | Freq: Every day | ORAL | 11 refills | Status: DC

## 2016-10-19 MED ORDER — PREDNISONE 20 MG PO TABS: 40.0000 mg | ORAL_TABLET | Freq: Every day | ORAL | 0 refills | Status: AC

## 2016-10-19 MED ORDER — ALBUTEROL SULFATE (2.5 MG/3ML) 0.083% IN NEBU: 2.5000 mg | INHALATION_SOLUTION | Freq: Four times a day (QID) | RESPIRATORY_TRACT | 3 refills | Status: DC | PRN

## 2016-10-19 MED ORDER — METFORMIN HCL 500 MG PO TABS: 500.0000 mg | ORAL_TABLET | Freq: Two times a day (BID) | ORAL | 3 refills | Status: DC

## 2016-10-19 NOTE — Progress Notes (Signed)
Subjective:   Patient ID: Kimberly Green, female    DOB: 1961/04/03, 56 y.o.   MRN: [id number] CC: 3 week follow up multiple med problems HPI: Kimberly Green is a 56 y.o. female presenting for 3 week follow up  Seen in ED one week ago got prednisone, did not fill it  3-4 weeks last dose of prednisone Continues to have breathing problems Being treated for COPD, has had PFTs   COPD: doesn't have inhalers at home SOB has been present for months Cough and phlegm get worse when she gets sick  Renal mass: has appt next month  Tobacco use: 5-10 a day  Continues to have L wrist pain and swelling off and on isnt sure if prednisone bursts she has bene on in the past have helped the wrist pain or not  Relevant past medical, surgical, family and social history reviewed. Allergies and medications reviewed and updated. History  Smoking Status  . Current Every Day Smoker  . Packs/day: 0.50  . Years: 38.00  . Types: Cigarettes  Smokeless Tobacco  . Never Used   ROS: Per HPI   Objective:    BP 126/70   Pulse 92   Temp 98 F (36.7 C) (Oral)   Resp (!) 22   Ht '5\' 4"'$  (1.626 m)   Wt 261 lb 6.4 oz (118.6 kg)   SpO2 97%   BMI 44.87 kg/m   Wt Readings from Last 3 Encounters:  10/19/16 261 lb 6.4 oz (118.6 kg)  09/28/16 261 lb 3.2 oz (118.5 kg)  12/24/15 267 lb 6.4 oz (121.3 kg)    Gen: NAD, alert, cooperative with exam, NCAT EYES: EOMI, no conjunctival injection, or no icterus ENT:  TMs pearly gray b/l, OP without erythema LYMPH: no cervical LAD CV: NRRR, normal S1/S2, no murmur, distal pulses 2+ b/l Resp: slight exp wheeze b/l, moving air well, no crackles. normal WOB Ext: No edema, warm Neuro: Alert and oriented MSK: normal muscle bulk  Assessment & Plan:  Kimberly Green was seen today for 3 week follow up multiple med problems.  Diagnoses and all orders for this visit:  COPD exacerbation ([place]) Wheezing today on exam Has been treated with multiple courses of abx and  prednisone Coughing a lot now Symptoms ongoing past 3-4 weeks Start controller med Albuterol TID while coughing 3 day prednisone burst -     albuterol (PROVENTIL) (2.5 MG/3ML) 0.083% nebulizer solution; Take 3 mLs (2.5 mg total) by nebulization every 6 (six) hours as needed. -     predniSONE (DELTASONE) 20 MG tablet; Take 2 tablets (40 mg total) by mouth daily with breakfast. -     mometasone-formoterol (DULERA) 100-5 MCG/ACT AERO; Inhale 2 puffs into the lungs 2 (two) times daily.  Type 2 diabetes mellitus without complication, without long-term current use of insulin (HCC) A1c much elevated today Has been homeless in the past year On prednisone for breathing problems recently Not taking any meds A1c 6.7 10 months ago Minimize sugar intake, stop sodas, start farxiga, metformin, take AM BGLs follow up 4 weeks -     metFORMIN (GLUCOPHAGE) 500 MG tablet; Take 1 tablet (500 mg total) by mouth 2 (two) times daily with a meal. -     Bayer DCA Hb A1c Waived -     CMP14+EGFR -     Lipid panel -     dapagliflozin propanediol (FARXIGA) 5 MG TABS tablet; Take 5 mg by mouth daily.  Sinusitis, unspecified chronicity, unspecified location -  cetirizine (ZYRTEC) 10 MG tablet; Take 1 tablet (10 mg total) by mouth daily. -     fluticasone (FLONASE) 50 MCG/ACT nasal spray; Place 2 sprays into both nostrils daily.  Swelling of joint of left wrist -     Cyclic Citrul Peptide Antibody, IGG -     Rheumatoid factor -     ANA  Need for prophylactic vaccination with Streptococcus pneumoniae (Pneumococcus) and Influenza vaccines -     Pneumococcal conjugate vaccine 13-valent  Follow up plan: Return in about 4 weeks (around 11/16/2016). Assunta Found, MD [place]

## 2016-10-20 LAB — CMP14+EGFR
A/G RATIO: 1.5 (ref 1.2–2.2)
ALK PHOS: 103 IU/L (ref 39–117)
ALT: 41 IU/L — ABNORMAL HIGH (ref 0–32)
AST: 29 IU/L (ref 0–40)
Albumin: 4 g/dL (ref 3.5–5.5)
BILIRUBIN TOTAL: 0.3 mg/dL (ref 0.0–1.2)
BUN / CREAT RATIO: 11 (ref 9–23)
BUN: 8 mg/dL (ref 6–24)
CHLORIDE: 97 mmol/L (ref 96–106)
CO2: 27 mmol/L (ref 18–29)
CREATININE: 0.72 mg/dL (ref 0.57–1.00)
Calcium: 9.3 mg/dL (ref 8.7–10.2)
GFR calc Af Amer: 111 mL/min/{1.73_m2} (ref >59)
GFR calc non Af Amer: 96 mL/min/{1.73_m2} (ref >59)
GLOBULIN, TOTAL: 2.6 g/dL (ref 1.5–4.5)
Glucose: 422 mg/dL (ref 65–99)
POTASSIUM: 4.1 mmol/L (ref 3.5–5.2)
SODIUM: 137 mmol/L (ref 134–144)
Total Protein: 6.6 g/dL (ref 6.0–8.5)

## 2016-10-20 LAB — LIPID PANEL
CHOLESTEROL TOTAL: 230 mg/dL — AB (ref 100–199)
Chol/HDL Ratio: 8.5 ratio units — ABNORMAL HIGH (ref 0.0–4.4)
HDL: 27 mg/dL — ABNORMAL LOW (ref >39)
Triglycerides: 500 mg/dL — ABNORMAL HIGH (ref 0–149)

## 2016-10-20 LAB — RHEUMATOID FACTOR: Rhuematoid fact SerPl-aCnc: <10 IU/mL (ref 0.0–13.9)

## 2016-10-20 LAB — ANA: ANA: NEGATIVE

## 2016-10-20 MED ORDER — DAPAGLIFLOZIN PROPANEDIOL 5 MG PO TABS: 5.0000 mg | ORAL_TABLET | Freq: Every day | ORAL | 2 refills | Status: DC

## 2016-10-20 MED ORDER — MOMETASONE FURO-FORMOTEROL FUM 100-5 MCG/ACT IN AERO: 2.0000 | INHALATION_SPRAY | Freq: Two times a day (BID) | RESPIRATORY_TRACT | 2 refills | Status: DC

## 2016-11-09 ENCOUNTER — Ambulatory Visit: Payer: Medicaid Other | Admitting: Pediatrics

## 2016-11-17 DIAGNOSIS — K219 Gastro-esophageal reflux disease without esophagitis: Secondary | ICD-10-CM | POA: Insufficient documentation

## 2016-11-17 DIAGNOSIS — F339 Major depressive disorder, recurrent, unspecified: Secondary | ICD-10-CM | POA: Insufficient documentation

## 2016-11-17 DIAGNOSIS — Z85528 Personal history of other malignant neoplasm of kidney: Secondary | ICD-10-CM | POA: Insufficient documentation

## 2016-11-17 DIAGNOSIS — E785 Hyperlipidemia, unspecified: Secondary | ICD-10-CM

## 2016-11-17 DIAGNOSIS — F32A Depression, unspecified: Secondary | ICD-10-CM | POA: Insufficient documentation

## 2016-11-17 DIAGNOSIS — E1159 Type 2 diabetes mellitus with other circulatory complications: Secondary | ICD-10-CM | POA: Insufficient documentation

## 2016-11-17 DIAGNOSIS — I10 Essential (primary) hypertension: Secondary | ICD-10-CM | POA: Insufficient documentation

## 2016-11-17 DIAGNOSIS — E1169 Type 2 diabetes mellitus with other specified complication: Secondary | ICD-10-CM | POA: Insufficient documentation

## 2016-11-17 DIAGNOSIS — J449 Chronic obstructive pulmonary disease, unspecified: Secondary | ICD-10-CM | POA: Insufficient documentation

## 2016-11-18 ENCOUNTER — Ambulatory Visit: Payer: Medicaid Other | Admitting: Pediatrics

## 2016-11-22 ENCOUNTER — Ambulatory Visit (INDEPENDENT_AMBULATORY_CARE_PROVIDER_SITE_OTHER): Payer: Medicaid Other | Admitting: Pediatrics

## 2016-11-22 ENCOUNTER — Encounter: Payer: Self-pay | Admitting: Pediatrics

## 2016-11-22 VITALS — BP 120/74 | HR 92 | Temp 97.6°F | Ht 64.0 in | Wt 257.0 lb

## 2016-11-22 DIAGNOSIS — F32A Depression, unspecified: Secondary | ICD-10-CM

## 2016-11-22 DIAGNOSIS — E119 Type 2 diabetes mellitus without complications: Secondary | ICD-10-CM

## 2016-11-22 DIAGNOSIS — R0683 Snoring: Secondary | ICD-10-CM | POA: Diagnosis not present

## 2016-11-22 DIAGNOSIS — F329 Major depressive disorder, single episode, unspecified: Secondary | ICD-10-CM

## 2016-11-22 DIAGNOSIS — R0609 Other forms of dyspnea: Secondary | ICD-10-CM | POA: Diagnosis not present

## 2016-11-22 DIAGNOSIS — E785 Hyperlipidemia, unspecified: Secondary | ICD-10-CM

## 2016-11-22 MED ORDER — METFORMIN HCL 500 MG PO TABS: 500.0000 mg | ORAL_TABLET | Freq: Two times a day (BID) | ORAL | 3 refills | Status: DC

## 2016-11-22 MED ORDER — QUETIAPINE FUMARATE 100 MG PO TABS: 100.0000 mg | ORAL_TABLET | Freq: Every day | ORAL | 2 refills | Status: DC

## 2016-11-22 NOTE — Progress Notes (Signed)
  Subjective:   Patient ID: Kimberly Green, female    DOB: [birth year], 56 y.o.   MRN: [id number] CC: Follow-up (Diabetes)  HPI: Kimberly Green is a 56 y.o. female presenting for Follow-up (Diabetes)  DM2: past week BGLs 120s-150s, high in 180s Taking metformin  COPD: Needs albuterol sometimes, feels SOB Decreasing smoking, a few days alst week none Not over 10 a day in a month  Elevated BMI: Cut out pepsis  H/o bipolar and anxiety: previously followed by [facility] Previously has been on multiple medications Mood fairly stable Has a hard time sleeping  Gets SOB with any exertion Has been getting worse Ongoing for several months No chest pain  Snores regularly, wakes herself up snoring, has headaches in the mornings sometimes  Relevant past medical, surgical, family and social history reviewed. Allergies and medications reviewed and updated. History  Smoking Status  . Current Every Day Smoker  . Packs/day: 0.50  . Years: 38.00  . Types: Cigarettes  Smokeless Tobacco  . Never Used   ROS: Per HPI   Objective:    BP 120/74   Pulse 92   Temp 97.6 F (36.4 C) (Oral)   Ht 5\' 4"  (1.626 m)   Wt 257 lb (116.6 kg)   BMI 44.11 kg/m   Wt Readings from Last 3 Encounters:  11/22/16 257 lb (116.6 kg)  10/19/16 261 lb 6.4 oz (118.6 kg)  09/28/16 261 lb 3.2 oz (118.5 kg)    Gen: NAD, alert, cooperative with exam, NCAT EYES: EOMI, no conjunctival injection, or no icterus CV: NRRR, normal S1/S2, no murmur, distal pulses 2+ b/l Resp: CTABL, no wheezes, normal WOB Abd: +BS, soft, NTND. Ext: No edema, warm Neuro: Alert and oriented  Assessment & Plan:  Kimberly Green was seen today for follow-up.  Diagnoses and all orders for this visit:  Snoring Sleep apnea eval -     Ambulatory referral to Pulmonology  Depression, unspecified depression type Ongoing symptoms, feels safe at home Hard time sleeping Mood still down often seroquel has worked for her in the  past -     Ambulatory referral to Psychiatry -     QUEtiapine (SEROQUEL) 100 MG tablet; Take 1 tablet (100 mg total) by mouth at bedtime.  DOE (dyspnea on exertion) Referral for stress test/CAD eval Risk factors include DM2 -     Ambulatory referral to Cardiology  Diabetes mellitus without complication ([place]) AM BGLs improving Will recheck A1c next visit -     metFORMIN (GLUCOPHAGE) 500 MG tablet; Take 1 tablet (500 mg total) by mouth 2 (two) times daily with a meal.  Hyperlipidemia, unspecified hyperlipidemia type -     Lipid panel   Follow up plan: Return in about 4 weeks (around 12/20/2016). Assunta Found, MD [place]

## 2016-11-23 LAB — LIPID PANEL
CHOL/HDL RATIO: 7.4 ratio — AB (ref 0.0–4.4)
CHOLESTEROL TOTAL: 236 mg/dL — AB (ref 100–199)
HDL: 32 mg/dL — AB (ref >39)
LDL Calculated: 139 mg/dL — ABNORMAL HIGH (ref 0–99)
TRIGLYCERIDES: 325 mg/dL — AB (ref 0–149)
VLDL Cholesterol Cal: 65 mg/dL — ABNORMAL HIGH (ref 5–40)

## 2016-11-25 ENCOUNTER — Telehealth: Payer: Self-pay | Admitting: Pharmacist

## 2016-11-25 DIAGNOSIS — J449 Chronic obstructive pulmonary disease, unspecified: Secondary | ICD-10-CM

## 2016-11-25 DIAGNOSIS — J329 Chronic sinusitis, unspecified: Secondary | ICD-10-CM

## 2016-11-25 DIAGNOSIS — J302 Other seasonal allergic rhinitis: Secondary | ICD-10-CM

## 2016-11-25 DIAGNOSIS — E119 Type 2 diabetes mellitus without complications: Secondary | ICD-10-CM

## 2016-11-25 DIAGNOSIS — F32A Depression, unspecified: Secondary | ICD-10-CM

## 2016-11-25 DIAGNOSIS — F329 Major depressive disorder, single episode, unspecified: Secondary | ICD-10-CM

## 2016-11-25 DIAGNOSIS — J441 Chronic obstructive pulmonary disease with (acute) exacerbation: Secondary | ICD-10-CM

## 2016-11-25 MED ORDER — ALBUTEROL SULFATE (2.5 MG/3ML) 0.083% IN NEBU: 2.5000 mg | INHALATION_SOLUTION | Freq: Four times a day (QID) | RESPIRATORY_TRACT | 2 refills | Status: DC | PRN

## 2016-11-25 MED ORDER — ACCU-CHEK SOFT TOUCH LANCETS MISC: 2 refills | Status: AC

## 2016-11-25 MED ORDER — QUETIAPINE FUMARATE 100 MG PO TABS: 100.0000 mg | ORAL_TABLET | Freq: Every day | ORAL | 2 refills | Status: DC

## 2016-11-25 MED ORDER — CETIRIZINE HCL 10 MG PO TABS: 10.0000 mg | ORAL_TABLET | Freq: Every day | ORAL | 2 refills | Status: DC

## 2016-11-25 MED ORDER — ALBUTEROL SULFATE HFA 108 (90 BASE) MCG/ACT IN AERS: 1.0000 | INHALATION_SPRAY | Freq: Four times a day (QID) | RESPIRATORY_TRACT | 0 refills | Status: DC | PRN

## 2016-11-25 MED ORDER — GLUCOSE BLOOD VI STRP: ORAL_STRIP | 2 refills | Status: AC

## 2016-11-25 MED ORDER — ACCU-CHEK AVIVA PLUS W/DEVICE KIT: PACK | 0 refills | Status: DC

## 2016-11-25 MED ORDER — METFORMIN HCL 1000 MG PO TABS: 500.0000 mg | ORAL_TABLET | Freq: Two times a day (BID) | ORAL | 2 refills | Status: DC

## 2016-11-25 NOTE — Telephone Encounter (Signed)
Called patient today to check on BG.  She states that HBG has been less than 160 since she started taking metformin 1000mg  bid.  She states that she has only 2 days left and will also run out of testing supplies soon.  She does not have money to pay $3 copay for each prescription.  I called [facility] and they will waive Medicaid copay.  Ok's by PCP to send in Rx for glucometer, strips, lancets, metformin 1000mg  bid, proventil nebs / inhaler, cetirizine, flonase, seroquel.

## 2016-12-04 DIAGNOSIS — F419 Anxiety disorder, unspecified: Secondary | ICD-10-CM | POA: Diagnosis not present

## 2016-12-04 DIAGNOSIS — R0602 Shortness of breath: Secondary | ICD-10-CM | POA: Diagnosis not present

## 2016-12-04 DIAGNOSIS — R0789 Other chest pain: Secondary | ICD-10-CM | POA: Diagnosis not present

## 2016-12-05 ENCOUNTER — Telehealth: Payer: Self-pay | Admitting: Pediatrics

## 2016-12-05 NOTE — Telephone Encounter (Signed)
Pt aware of recommendations. Appt has been changed from 3/8 to 3/1

## 2016-12-05 NOTE — Telephone Encounter (Signed)
Neither one of those medicines should cause panic attacks either. She should set up appt to be seen, especially if happening frequently.

## 2016-12-05 NOTE — Telephone Encounter (Signed)
Looked over notes from last TC. Instructed patient metformin is to be 1000 mg twice daily not TID.  She went to [facility] ED last night with what she is thinking was a panic attack, she had these back in 2010. This has been going on for the last 4 days. Does not think it is her Seroquel since she has been on this for years. Wonders if it could be the [place] or the [employer]

## 2016-12-08 ENCOUNTER — Ambulatory Visit: Payer: Medicaid Other | Admitting: Pediatrics

## 2016-12-14 ENCOUNTER — Ambulatory Visit (INDEPENDENT_AMBULATORY_CARE_PROVIDER_SITE_OTHER): Payer: Medicaid Other | Admitting: Neurology

## 2016-12-14 ENCOUNTER — Encounter: Payer: Self-pay | Admitting: Neurology

## 2016-12-14 VITALS — BP 116/62 | HR 94 | Resp 18 | Ht 64.0 in | Wt 253.0 lb

## 2016-12-14 DIAGNOSIS — M5442 Lumbago with sciatica, left side: Secondary | ICD-10-CM | POA: Diagnosis not present

## 2016-12-14 DIAGNOSIS — R519 Headache, unspecified: Secondary | ICD-10-CM

## 2016-12-14 DIAGNOSIS — R51 Headache: Secondary | ICD-10-CM

## 2016-12-14 DIAGNOSIS — Z6841 Body Mass Index (BMI) 40.0 and over, adult: Secondary | ICD-10-CM | POA: Diagnosis not present

## 2016-12-14 DIAGNOSIS — R400 Somnolence: Secondary | ICD-10-CM | POA: Diagnosis not present

## 2016-12-14 DIAGNOSIS — G8929 Other chronic pain: Secondary | ICD-10-CM | POA: Diagnosis not present

## 2016-12-14 DIAGNOSIS — F39 Unspecified mood [affective] disorder: Secondary | ICD-10-CM | POA: Diagnosis not present

## 2016-12-14 DIAGNOSIS — R0683 Snoring: Secondary | ICD-10-CM

## 2016-12-14 NOTE — Patient Instructions (Signed)
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
## 2016-12-16 ENCOUNTER — Ambulatory Visit: Payer: Medicaid Other | Admitting: Cardiovascular Disease

## 2016-12-20 ENCOUNTER — Ambulatory Visit: Payer: Medicaid Other | Admitting: Pediatrics

## 2017-01-05 ENCOUNTER — Ambulatory Visit: Payer: Medicaid Other | Admitting: Cardiovascular Disease

## 2017-01-25 ENCOUNTER — Ambulatory Visit: Payer: Medicaid Other | Admitting: Cardiovascular Disease

## 2017-01-25 ENCOUNTER — Encounter: Payer: Self-pay | Admitting: Cardiovascular Disease

## 2017-02-06 ENCOUNTER — Ambulatory Visit (INDEPENDENT_AMBULATORY_CARE_PROVIDER_SITE_OTHER): Payer: Medicaid Other | Admitting: Neurology

## 2017-02-06 DIAGNOSIS — G4720 Circadian rhythm sleep disorder, unspecified type: Secondary | ICD-10-CM

## 2017-02-06 DIAGNOSIS — G4733 Obstructive sleep apnea (adult) (pediatric): Secondary | ICD-10-CM

## 2017-02-06 DIAGNOSIS — G479 Sleep disorder, unspecified: Secondary | ICD-10-CM

## 2017-02-10 ENCOUNTER — Telehealth: Payer: Self-pay | Admitting: Pediatrics

## 2017-02-10 NOTE — Progress Notes (Signed)
Patient referred by Dr. Evette Doffing, seen by me on 12/14/16, diagnostic PSG on 02/06/17.    Please call and notify the patient that the recent sleep study did confirm the diagnosis of obstructive sleep apnea. OSA is overall mild but moderate in REM sleep and supine sleep, with a total AHI of 12.9/hour, REM AHI of 26.8/hour, supine AHI of 25.3/hour, and O2 nadir of 72%. Given the patient's medical history and sleep related complaints as well as severe desaturations particularly in REM sleep, I recommend treatment for this in the form of CPAP. This will require a repeat sleep study for proper titration and mask fitting. Please explain to patient and arrange for a CPAP titration study. I have placed an order in the chart. Thanks, and please route to [facility] for scheduling next sleep study.  Star Age, MD, PhD [facility])

## 2017-02-10 NOTE — Addendum Note (Signed)
Addended by: Star Age on: 02/10/2017 12:24 PM   Modules accepted: Orders

## 2017-02-10 NOTE — Procedures (Signed)
PATIENT'S NAME:  Kimberly, Green DOB:      [birth year]      MR#:    [id number]     DATE OF RECORDING: 02/06/2017 REFERRING M.D.:  Arbie Cookey L. Evette Doffing, MD Study Performed:   Baseline Polysomnogram HISTORY: 56 year old woman with a history of type 2 diabetes, hyperlipidemia, history of renal carcinoma, reflux disease, hypertension, COPD, bipolar disorder, adrenal tumor, and morbid obesity, who reports snoring, morning HAs and excessive daytime somnolence. Her Epworth sleepiness score is 10 out of 24, her fatigue score is 63 out of 63. The patient's weight 253 pounds with a height of 64 (inches), resulting in a BMI of 43.3 kg/m2. The patient's neck circumference measured 16 inches.  CURRENT MEDICATIONS: Albuterol, Aspirin, Cetirizine, Dulera, Farxiga, Metformin and Quetiapine   PROCEDURE:  This is a multichannel digital polysomnogram utilizing the Somnostar 11.2 system.  Electrodes and sensors were applied and monitored per AASM Specifications.   EEG, EOG, Chin and Limb EMG, were sampled at 200 Hz.  ECG, Snore and Nasal Pressure, Thermal Airflow, Respiratory Effort, CPAP Flow and Pressure, Oximetry was sampled at 50 Hz. Digital video and audio were recorded.      BASELINE STUDY  Lights Out was at 21:31 and Lights On at 05:02.  Total recording time (TRT) was 452 minutes, with a total sleep time (TST) of  361.5 minutes.   The patient's sleep latency was 71 minutes, which is delayed.  REM latency was 298.5 minutes, which is markedly delayed.  The sleep efficiency was 80. %.     SLEEP ARCHITECTURE: WASO (Wake after sleep onset) was 43.5 minutes with moderate sleep fragmentation noted.  There were 31 minutes in Stage N1, 274.5 minutes Stage N2, 0 minutes Stage N3 and 56 minutes in Stage REM.  The percentage of Stage N1 was 8.6%, which is mildly increase, Stage N2 was 75.9%, which is markedly increased, Stage N3 was absent, and Stage R (REM sleep) was 15.5%, which is reduced. The arousals were noted as: 32  were spontaneous, 4 were associated with PLMs, 78 were associated with respiratory events.    Audio and video analysis did not show any abnormal or unusual movements, behaviors, phonations or vocalizations.  The patient took no bathroom breaks. Mild to moderate, and at times loud snoring was noted.  EKG was in keeping with normal sinus rhythm (NSR).  RESPIRATORY ANALYSIS: There were a total of 78 respiratory events:  0 obstructive apneas, 0 central apneas and 0 mixed apneas with a total of 0 apneas and an apnea index (AI) of 0 /hour. There were 78 hypopneas with a hypopnea index of 12.9 /hour. The patient also had 0 respiratory event related arousals (RERAs).      The total APNEA/HYPOPNEA INDEX (AHI) was 12.9/hour and the total RESPIRATORY DISTURBANCE INDEX was 12.9 /hour.  25 events occurred in REM sleep and 106 events in NREM. The REM AHI was 26.8 /hour, versus a non-REM AHI of 10.4. The patient spent 109 minutes of total sleep time in the supine position and 253 minutes in non-supine.. The supine AHI was 25.3 versus a non-supine AHI of 7.6.  OXYGEN SATURATION & C02:  The Wake baseline 02 saturation was 94%, with the lowest being 72%. Time spent below 89% saturation equaled 153 minutes.  PERIODIC LIMB MOVEMENTS: The patient had a total of 11 Periodic Limb Movements.  The Periodic Limb Movement (PLM) index was 1.8 and the PLM Arousal index was .7/hour.  Post-study, the patient indicated that sleep was worse than  usual (usually sleeps in a recliner).   IMPRESSION:  1. Obstructive Sleep Apnea(OSA) 2. Dysfunctions associated with sleep stages or arousal from sleep 3. Repetitive Intrusions of Sleep  RECOMMENDATIONS:  1. This study demonstrates overall mild obstructive sleep apnea, moderate in REM sleep and supine sleep, with a total AHI of 12.9/hour, REM AHI of 26.8/hour, supine AHI of 25.3/hour, and O2 nadir of 72%. Given the patient's medical history and sleep related complaints as well as  severe desaturations particularly in REM sleep, a full-night CPAP titration study is recommended to treat OSA and optimize therapy. Other treatment options may include (generally speaking): avoidance of supine sleep position along with weight loss, upper airway or jaw surgery in selected patients or the use of an oral appliance in certain patients. ENT evaluation and/or consultation with a maxillofacial surgeon or dentist may be feasible in some instances.    2. Please note that untreated obstructive sleep apnea carries additional perioperative morbidity. Patients with significant obstructive sleep apnea should receive perioperative PAP therapy and the surgeons and particularly the anesthesiologist should be informed of the diagnosis and the severity of the sleep disordered breathing. 3. This study shows sleep fragmentation and abnormal sleep stage percentages; these are nonspecific findings and per se do not signify an intrinsic sleep disorder or a cause for the patient's sleep-related symptoms. Causes include (but are not limited to) the first night effect of the sleep study, circadian rhythm disturbances, medication effect or an underlying mood disorder or medical problem.  4. The patient should be cautioned not to drive, work at heights, or operate dangerous or heavy equipment when tired or sleepy. Review and reiteration of good sleep hygiene measures should be pursued with any patient. 5. The patient will be seen in follow-up by Dr. Rexene Alberts at [facility] for discussion of the test results and further management strategies. The referring provider will be notified of the test results.  I certify that I have reviewed the entire raw data recording prior to the issuance of this report in accordance with the Standards of Accreditation of the American Academy of Sleep Medicine (AASM)   Star Age, MD, PhD Diplomat, American Board of Psychiatry and Neurology (Neurology and Sleep Medicine)

## 2017-02-10 NOTE — Telephone Encounter (Signed)
Notified pt that she will need to contact [facility] Pt verbalizes understanding

## 2017-02-13 ENCOUNTER — Telehealth: Payer: Self-pay | Admitting: Pediatrics

## 2017-02-13 DIAGNOSIS — E119 Type 2 diabetes mellitus without complications: Secondary | ICD-10-CM

## 2017-02-13 MED ORDER — FARXIGA 5 MG PO TABS: 10.0000 mg | ORAL_TABLET | Freq: Every day | ORAL | 0 refills | Status: DC

## 2017-02-13 MED ORDER — METFORMIN HCL 1000 MG PO TABS: 1000.0000 mg | ORAL_TABLET | Freq: Two times a day (BID) | ORAL | 0 refills | Status: DC

## 2017-02-13 NOTE — Telephone Encounter (Signed)
Returned patient's phone call.  Patient states that she has been taking metformin 1000mg  3 times daily and farxiga 5mg  nightly BS have been in the 150s-200s.  Patient is to take metformin 1000mg  twice daily and farxiga 10mg  once daily.  Per Dr. Evette Doffing.  Patient verbalized understanding.

## 2017-02-15 ENCOUNTER — Encounter: Payer: Self-pay | Admitting: *Deleted

## 2017-02-15 ENCOUNTER — Ambulatory Visit (INDEPENDENT_AMBULATORY_CARE_PROVIDER_SITE_OTHER): Payer: Medicaid Other | Admitting: Cardiovascular Disease

## 2017-02-15 ENCOUNTER — Encounter: Payer: Self-pay | Admitting: Cardiovascular Disease

## 2017-02-15 ENCOUNTER — Telehealth: Payer: Self-pay

## 2017-02-15 VITALS — BP 144/90 | HR 68 | Ht 64.0 in | Wt 261.0 lb

## 2017-02-15 DIAGNOSIS — R030 Elevated blood-pressure reading, without diagnosis of hypertension: Secondary | ICD-10-CM

## 2017-02-15 DIAGNOSIS — R0609 Other forms of dyspnea: Secondary | ICD-10-CM | POA: Diagnosis not present

## 2017-02-15 DIAGNOSIS — I209 Angina pectoris, unspecified: Secondary | ICD-10-CM

## 2017-02-15 DIAGNOSIS — E785 Hyperlipidemia, unspecified: Secondary | ICD-10-CM

## 2017-02-15 MED ORDER — ATORVASTATIN CALCIUM 20 MG PO TABS: 20.0000 mg | ORAL_TABLET | Freq: Every day | ORAL | 6 refills | Status: DC

## 2017-02-15 MED ORDER — ASPIRIN EC 81 MG PO TBEC: 81.0000 mg | DELAYED_RELEASE_TABLET | Freq: Every day | ORAL | Status: DC

## 2017-02-15 NOTE — Patient Instructions (Addendum)
Medication Instructions:   Decrease Aspirin to 81mg  daily.  Begin Lipitor 20mg  daily.   Continue all other medications.    Labwork: none  Testing/Procedures:  Your physician has requested that you have an echocardiogram with contrast . Echocardiography is a painless test that uses sound waves to create images of your heart. It provides your doctor with information about the size and shape of your heart and how well your heart's chambers and valves are working. This procedure takes approximately one hour. There are no restrictions for this procedure.  Your physician has requested that you have a lexiscan myoview. For further information please visit [email or web address]. Please follow instruction sheet, as given. (2 day protocol)  Office will contact with results via phone or letter.    Follow-Up: 3 weeks   Any Other Special Instructions Will Be Listed Below (If Applicable).  If you need a refill on your cardiac medications before your next appointment, please call your pharmacy.

## 2017-02-15 NOTE — Telephone Encounter (Signed)
Spoke with patient and gave results of sleep study. I explained the results and scheduled her Cpap titration. She understood the results.

## 2017-02-15 NOTE — Progress Notes (Signed)
CARDIOLOGY CONSULT NOTE  Patient ID: Kimberly Green MRN: [id number] DOB/AGE: 54-Dec-1962 54 y.o.  Admit date: (Not on file) Primary Physician: Eustaquio Maize, MD Referring Physician: Evette Doffing  Reason for Consultation: Exertional dyspnea  HPI: Kimberly Green is a 56 y.o. female who is being seen today for the evaluation of exertional dyspnea at the request of Eustaquio Maize, MD.   She has a history of diabetes, COPD, and tobacco abuse. She denies exertional chest pain but has had progressive exertional dyspnea. She was recently diagnosed with mild to moderate obstructive sleep apnea.  She underwent a normal dobutamine stress echocardiogram in November 2012.  For the past 2 years she has had problems with exertional dyspnea which is getting progressively worse. She also has COPD but she feels that these symptoms are different. She climbs 13 steps to her bathroom and is markedly short of breath by the time she gets to the top. She also has left precordial chest tightness which radiates into the left side of her neck, left arm, and left shoulder blade. She also complains of facial tingling and numbness. She feels like her "heart is pounding very hard" by the time she gets to the top of the steps. She has also felt generalized weakness. She walks uphill when taking her granddaughter to the bus stop and feels short of breath and weak afterwards.  She also complains of episodes of her "heart stopping". She said she "melts to the floor ". She denies loss of consciousness.  Blood pressure elevated at 144/90 today. It was 120/74 at PCPs office on 11/22/16.  I reviewed lipids performed on 11/22/16: Total cholesterol 236, triglycerides 325, HDL 32, LDL 139.  She said she underwent a stress test in 2015 at [facility].  ECG performed in the office today which I ordered and personally interpreted demonstrates normal sinus rhythm with no ischemic ST segment or  T-wave abnormalities, nor any arrhythmias.      No Known Allergies  Current Outpatient Prescriptions  Medication Sig Dispense Refill  . albuterol (PROVENTIL HFA;VENTOLIN HFA) 108 (90 Base) MCG/ACT inhaler Inhale 1-2 puffs into the lungs every 6 (six) hours as needed for wheezing or shortness of breath. 1 Inhaler 0  . albuterol (PROVENTIL) (2.5 MG/3ML) 0.083% nebulizer solution Take 3 mLs (2.5 mg total) by nebulization every 6 (six) hours as needed. 75 mL 2  . aspirin 500 MG EC tablet Take 500 mg by mouth daily.    . Blood Glucose Monitoring Suppl (ACCU-CHEK AVIVA PLUS) w/Device KIT Use to check BG bid 1 kit 0  . cetirizine (ZYRTEC) 10 MG tablet Take 1 tablet (10 mg total) by mouth daily. 34 tablet 2  . DULERA 100-5 MCG/ACT AERO Inhale 2 puffs into the lungs 2 (two) times daily.  2  . FARXIGA 5 MG TABS tablet Take 10 mg by mouth daily. 60 tablet 0  . fluticasone (FLONASE) 50 MCG/ACT nasal spray Place 2 sprays into both nostrils daily.  6  . glucose blood (ACCU-CHEK AVIVA PLUS) test strip Use to check BG twice daily 100 each 2  . Lancets (ACCU-CHEK SOFT TOUCH) lancets Use to check BG twice a day 100 each 2  . metFORMIN (GLUCOPHAGE) 1000 MG tablet Take 1 tablet (1,000 mg total) by mouth 2 (two) times daily with a meal. 60 tablet 0  . QUEtiapine (SEROQUEL) 100 MG tablet Take 1 tablet (100 mg total) by mouth at bedtime. 34 tablet 2   No  current facility-administered medications for this visit.     Past Medical History:  Diagnosis Date  . Adrenal tumor, benign    S/P L adrenal resection, 10/11 ([place])  . Asthma   . Bipolar disorder ([place])   . Cholelithiasis   . COPD (chronic obstructive pulmonary disease) ([place])   . Diabetes mellitus without complication ([place])   . Essential hypertension, benign   . GERD (gastroesophageal reflux disease)   . IBS (irritable bowel syndrome)   . Mixed hyperlipidemia   . PTSD (post-traumatic stress disorder)   . Renal carcinoma ([place])    S/P partial R  nephrectomy, 8/11 [facility])    Past Surgical History:  Procedure Laterality Date  . BACK SURGERY    . CHOLECYSTECTOMY    . KIDNEY MASS REMOVAL    . NECK SURGERY    . RIGHT WRIST SURGERY    . SHOULDER SURGERY Left   . TUBAL LIGATION      Social History   Social History  . Marital status: Divorced    Spouse name: N/A  . Number of children: 2  . Years of education: 12   Occupational History  . Not on file.   Social History Main Topics  . Smoking status: Current Every Day Smoker    Packs/day: 0.50    Years: 38.00    Types: Cigarettes  . Smokeless tobacco: Never Used     Comment: Smokes 5 a day   . Alcohol use No  . Drug use: No  . Sexual activity: Not on file   Other Topics Concern  . Not on file   Social History Narrative   Drinks 1 cup of coffee a day      No family history of premature CAD in 1st degree relatives.  Current Meds  Medication Sig  . albuterol (PROVENTIL HFA;VENTOLIN HFA) 108 (90 Base) MCG/ACT inhaler Inhale 1-2 puffs into the lungs every 6 (six) hours as needed for wheezing or shortness of breath.  Marland Kitchen albuterol (PROVENTIL) (2.5 MG/3ML) 0.083% nebulizer solution Take 3 mLs (2.5 mg total) by nebulization every 6 (six) hours as needed.  Marland Kitchen aspirin 500 MG EC tablet Take 500 mg by mouth daily.  . Blood Glucose Monitoring Suppl (ACCU-CHEK AVIVA PLUS) w/Device KIT Use to check BG bid  . cetirizine (ZYRTEC) 10 MG tablet Take 1 tablet (10 mg total) by mouth daily.  . DULERA 100-5 MCG/ACT AERO Inhale 2 puffs into the lungs 2 (two) times daily.  Marland Kitchen FARXIGA 5 MG TABS tablet Take 10 mg by mouth daily.  . fluticasone (FLONASE) 50 MCG/ACT nasal spray Place 2 sprays into both nostrils daily.  Marland Kitchen glucose blood (ACCU-CHEK AVIVA PLUS) test strip Use to check BG twice daily  . Lancets (ACCU-CHEK SOFT TOUCH) lancets Use to check BG twice a day  . metFORMIN (GLUCOPHAGE) 1000 MG tablet Take 1 tablet (1,000 mg total) by mouth 2 (two) times daily with a meal.  . QUEtiapine  (SEROQUEL) 100 MG tablet Take 1 tablet (100 mg total) by mouth at bedtime.      Review of systems complete and found to be negative unless listed above in HPI    Physical exam Blood pressure (!) 144/90, pulse 68, height '5\' 4"'$  (1.626 m), weight 261 lb (118.4 kg), SpO2 98 %. General: NAD Neck: No JVD, no thyromegaly or thyroid nodule.  Lungs: Clear to auscultation bilaterally with normal respiratory effort. CV: Nondisplaced PMI. Regular rate and rhythm, normal S1/S2, no S3/S4, no murmur.  No peripheral edema.  No carotid bruit.    Abdomen: Soft, nontender, obese.  Skin: Intact without lesions or rashes.  Neurologic: Alert and oriented x 3.  Psych: Normal affect. Extremities: No clubbing or cyanosis.  HEENT: Normal.   ECG: Most recent ECG reviewed.   Labs: Lab Results  Component Value Date/Time   K 4.1 10/19/2016 02:56 PM   BUN 8 10/19/2016 02:56 PM   CREATININE 0.72 10/19/2016 02:56 PM   ALT 41 (H) 10/19/2016 02:56 PM   TSH 3.030 12/24/2015 11:44 AM   HGB 14.1 05/26/2009 03:43 PM     Lipids: Lab Results  Component Value Date/Time   LDLCALC 139 (H) 11/22/2016 12:08 PM   CHOL 236 (H) 11/22/2016 12:08 PM   TRIG 325 (H) 11/22/2016 12:08 PM   HDL 32 (L) 11/22/2016 12:08 PM        ASSESSMENT AND PLAN:  1. Dyspnea on exertion and chest tightness: She has several cardiovascular risk factors including dyslipidemia, elevated blood pressure, tobacco abuse, and type 2 diabetes. I will proceed with a nuclear myocardial perfusion imaging study to evaluate for ischemic heart disease (2-day [place]). I will order a 2-D echocardiogram with Doppler (with contrast) to evaluate cardiac structure, function, and regional wall motion. I will try and obtain stress test and other hospital records from [facility].  2. Elevated blood pressure: It was normal, 120/74 on 11/22/16. I will monitor this to see if antihypertensive therapy needs to be initiated.  3. Mixed  dyslipidemia: Lipids reviewed above. Given her history of diabetes, statin therapy is indicated to reduce cardiovascular risk. I will start Lipitor 20 mg daily. Lipids will need to be repeated in 3 months.   Disposition: Follow up in 3 weeks  Signed: Kate Sable, M.D., F.A.C.C.  02/15/2017, 1:37 PM

## 2017-02-22 ENCOUNTER — Ambulatory Visit (INDEPENDENT_AMBULATORY_CARE_PROVIDER_SITE_OTHER): Payer: Medicaid Other

## 2017-02-22 ENCOUNTER — Ambulatory Visit (INDEPENDENT_AMBULATORY_CARE_PROVIDER_SITE_OTHER): Payer: Medicaid Other | Admitting: Pediatrics

## 2017-02-22 ENCOUNTER — Encounter: Payer: Self-pay | Admitting: Pediatrics

## 2017-02-22 VITALS — BP 106/71 | HR 91 | Temp 97.7°F | Ht 64.0 in | Wt 258.6 lb

## 2017-02-22 DIAGNOSIS — F329 Major depressive disorder, single episode, unspecified: Secondary | ICD-10-CM | POA: Diagnosis not present

## 2017-02-22 DIAGNOSIS — M79671 Pain in right foot: Secondary | ICD-10-CM | POA: Diagnosis not present

## 2017-02-22 DIAGNOSIS — E119 Type 2 diabetes mellitus without complications: Secondary | ICD-10-CM | POA: Diagnosis not present

## 2017-02-22 DIAGNOSIS — J449 Chronic obstructive pulmonary disease, unspecified: Secondary | ICD-10-CM

## 2017-02-22 DIAGNOSIS — E1169 Type 2 diabetes mellitus with other specified complication: Secondary | ICD-10-CM

## 2017-02-22 DIAGNOSIS — E785 Hyperlipidemia, unspecified: Secondary | ICD-10-CM

## 2017-02-22 DIAGNOSIS — Z720 Tobacco use: Secondary | ICD-10-CM | POA: Diagnosis not present

## 2017-02-22 DIAGNOSIS — F32A Depression, unspecified: Secondary | ICD-10-CM

## 2017-02-22 DIAGNOSIS — R0683 Snoring: Secondary | ICD-10-CM | POA: Diagnosis not present

## 2017-02-22 LAB — BAYER DCA HB A1C WAIVED: HB A1C (BAYER DCA - WAIVED): 6.4 % (ref <7.0)

## 2017-02-22 NOTE — Progress Notes (Signed)
  Subjective:   Patient ID: Kimberly Green, female    DOB: 1961-06-09, 56 y.o.   MRN: [id number] CC: Follow-up; Knee Pain (right); and Foot Pain (Right)  HPI: Kimberly Green is a 56 y.o. female presenting for Follow-up; Knee Pain (right); and Foot Pain (Right)  HLD: hasnt been able to now on lipitor  OSA: has appt to get CPAP machine this week  Chest pressure: seen by cardiology, has ECHo and stress test this week Has a sharp pain in L hand  DM2: BGLs 150s-170s On farxiga and metformin, since increasing farxiga to 10mg  now with BGLs in 100s  Off of regular sodas Drinking diet sodas with caffeine regularly  Depression: very stressful at home, outside irritations, not family trouble Living with 3 grandkids and son in law Having hard time getting to appts, paying for rent [facility] at [facility] Seeing psychiatrist next week Has been tried on multiple medications in the past Passive thoughts about not wanting to be here Has had SA in distant past, 1979, 1984  Tobacco use: about 5 cig a day Son in law smokes upstairs, she says she is not around him  R foot pain ongoing for several weeks No known injury Hurts in her toes, lateral side of R foot  Relevant past medical, surgical, family and social history reviewed. Allergies and medications reviewed and updated. History  Smoking Status  . Current Every Day Smoker  . Packs/day: 0.50  . Years: 38.00  . Types: Cigarettes  Smokeless Tobacco  . Never Used    Comment: Smokes 5 a day    ROS: Per HPI   Objective:    BP 106/71   Pulse 91   Temp 97.7 F (36.5 C) (Oral)   Ht 5\' 4"  (1.626 m)   Wt 258 lb 9.6 oz (117.3 kg)   BMI 44.39 kg/m   Wt Readings from Last 3 Encounters:  02/22/17 258 lb 9.6 oz (117.3 kg)  02/15/17 261 lb (118.4 kg)  12/14/16 253 lb (114.8 kg)    Gen: NAD, alert, cooperative with exam, NCAT EYES: EOMI, no conjunctival injection, or no icterus ENT:  TMs pearly gray b/l, OP without  erythema LYMPH: no cervical LAD CV: NRRR, normal S1/S2, no murmur Resp: CTABL, no wheezes, normal WOB Abd: +BS, soft, NTND.  Ext: No edema, warm Neuro: Alert and oriented Assessment & Plan:  Kimberly Green was seen today for follow-up, knee pain and foot pain.  Diagnoses and all orders for this visit:  Diabetes mellitus without complication (HCC) V6H much improved, down to 6.4 Cont current meds -     Bayer DCA Hb A1c Waived  Chronic obstructive pulmonary disease, unspecified COPD type (HCC) Stable, cont current meds  Snoring Upcoming sleep study  Depression, unspecified depression type Ongoing symptoms, feels safe at home. Passive thoughts about not wanting to be here, no plan. Has good support system at home. Seeing counselor regularly. Has appt with psychiatry next week. Wants to wait until then to start on medication. Comfortable seeking help, rtc, crisis number if any worsening of symptoms  Hyperlipidemia associated with type 2 diabetes mellitus ([place]) On pravastatin   Tobacco use Decreasing, cont to encourage cessation  Right foot pain -     DG Foot Complete Right; Future   Follow up plan: Return in about 4 weeks (around 03/22/2017). Assunta Found, MD [place]

## 2017-02-23 ENCOUNTER — Ambulatory Visit (INDEPENDENT_AMBULATORY_CARE_PROVIDER_SITE_OTHER): Payer: Medicaid Other | Admitting: Neurology

## 2017-02-23 DIAGNOSIS — G4733 Obstructive sleep apnea (adult) (pediatric): Secondary | ICD-10-CM | POA: Diagnosis not present

## 2017-02-23 DIAGNOSIS — G4720 Circadian rhythm sleep disorder, unspecified type: Secondary | ICD-10-CM

## 2017-02-27 ENCOUNTER — Encounter (HOSPITAL_BASED_OUTPATIENT_CLINIC_OR_DEPARTMENT_OTHER)
Admission: RE | Admit: 2017-02-27 | Discharge: 2017-02-27 | Disposition: A | Discharge status: Home / Self Care | Payer: Medicaid Other | Source: Ambulatory Visit | Attending: Cardiovascular Disease | Admitting: Cardiovascular Disease

## 2017-02-27 ENCOUNTER — Encounter (HOSPITAL_COMMUNITY): Payer: Self-pay

## 2017-02-27 ENCOUNTER — Other Ambulatory Visit: Payer: Self-pay | Admitting: Cardiology

## 2017-02-27 ENCOUNTER — Encounter (HOSPITAL_COMMUNITY): Payer: Medicaid Other

## 2017-02-27 ENCOUNTER — Encounter (HOSPITAL_COMMUNITY)
Admission: RE | Admit: 2017-02-27 | Discharge: 2017-02-27 | Disposition: A | Discharge status: Home / Self Care | Payer: Medicaid Other | Source: Ambulatory Visit | Attending: Cardiovascular Disease | Admitting: Cardiovascular Disease

## 2017-02-27 ENCOUNTER — Ambulatory Visit (HOSPITAL_COMMUNITY)
Admission: RE | Admit: 2017-02-27 | Discharge: 2017-02-27 | Disposition: A | Discharge status: Home / Self Care | Payer: Medicaid Other | Source: Ambulatory Visit | Attending: Cardiovascular Disease | Admitting: Cardiovascular Disease

## 2017-02-27 DIAGNOSIS — I700 Atherosclerosis of aorta: Secondary | ICD-10-CM | POA: Diagnosis not present

## 2017-02-27 DIAGNOSIS — I209 Angina pectoris, unspecified: Secondary | ICD-10-CM

## 2017-02-27 DIAGNOSIS — R0609 Other forms of dyspnea: Secondary | ICD-10-CM

## 2017-02-27 MED ORDER — TECHNETIUM TC 99M TETROFOSMIN IV KIT
30.0000 | PACK | Freq: Once | INTRAVENOUS | Status: AC | PRN
  Administered 2017-02-27: 33 via INTRAVENOUS

## 2017-02-27 MED ORDER — AMINOPHYLLINE 25 MG/ML IV SOLN
INTRAVENOUS | Status: AC
  Administered 2017-02-27: 75 mg via INTRAVENOUS
  Filled 2017-02-27: qty 10

## 2017-02-27 MED ORDER — AMINOPHYLLINE 25 MG/ML IV (NUC MED)
75.0000 mg | Freq: Once | INTRAVENOUS | Status: AC
  Administered 2017-02-27: 75 mg via INTRAVENOUS
  Filled 2017-02-27: qty 3

## 2017-02-27 MED ORDER — SODIUM CHLORIDE 0.9% FLUSH
INTRAVENOUS | Status: AC
  Administered 2017-02-27: 10 mL via INTRAVENOUS
  Filled 2017-02-27: qty 10

## 2017-02-27 MED ORDER — DIPHENHYDRAMINE HCL 50 MG/ML IJ SOLN
INTRAMUSCULAR | Status: AC
  Administered 2017-02-27: 25 mg via INTRAVENOUS
  Filled 2017-02-27: qty 1

## 2017-02-27 MED ORDER — REGADENOSON 0.4 MG/5ML IV SOLN
INTRAVENOUS | Status: AC
  Administered 2017-02-27: 0.4 mg via INTRAVENOUS
  Filled 2017-02-27: qty 5

## 2017-02-27 MED ORDER — DIPHENHYDRAMINE HCL 50 MG/ML IJ SOLN
25.0000 mg | Freq: Once | INTRAMUSCULAR | Status: DC
  Filled 2017-02-27: qty 1

## 2017-02-27 NOTE — Progress Notes (Signed)
*  PRELIMINARY RESULTS* Echocardiogram 2D Echocardiogram has been performed.  Kimberly Green 02/27/2017, 10:11 AM

## 2017-02-28 ENCOUNTER — Encounter (HOSPITAL_COMMUNITY)
Admission: RE | Admit: 2017-02-28 | Discharge: 2017-02-28 | Disposition: A | Discharge status: Home / Self Care | Payer: Medicaid Other | Source: Ambulatory Visit | Attending: Cardiovascular Disease | Admitting: Cardiovascular Disease

## 2017-02-28 ENCOUNTER — Encounter (HOSPITAL_COMMUNITY): Payer: Self-pay

## 2017-02-28 DIAGNOSIS — I209 Angina pectoris, unspecified: Secondary | ICD-10-CM | POA: Diagnosis not present

## 2017-02-28 LAB — NM MYOCAR MULTI W/SPECT W/WALL MOTION / EF
CHL CUP NUCLEAR SSS: 4
LHR: 0.28
LV sys vol: 20 mL
LVDIAVOL: 73 mL (ref 46–106)
Peak HR: 134 {beats}/min
Rest HR: 87 {beats}/min
SDS: 2
SRS: 2
TID: 0.92

## 2017-02-28 MED ORDER — TECHNETIUM TC 99M TETROFOSMIN IV KIT
25.0000 | PACK | Freq: Once | INTRAVENOUS | Status: AC | PRN
  Administered 2017-02-28: 26 via INTRAVENOUS

## 2017-02-28 NOTE — Addendum Note (Signed)
Addended by: Star Age on: 02/28/2017 08:48 AM   Modules accepted: Orders

## 2017-02-28 NOTE — Progress Notes (Signed)
Patient referred by Dr. Evette Doffing, seen by me on 12/14/16, diagnostic PSG on 02/06/17, CPAP/BIPAP titration on 02/23/17:  Please call and inform patient that I have entered an order for treatment with positive airway pressure (PAP) treatment of obstructive sleep apnea (OSA). She did well during the latest sleep study with BiPAP. We will, therefore, arrange for a machine for home use through a DME (durable medical equipment) company of Her choice; and I will see the patient back in follow-up in about 10 weeks. Please also explain to the patient that I will be looking out for compliance data, which can be downloaded from the machine (stored on an SD card, that is inserted in the machine) or via remote access through a modem, that is built into the machine. At the time of the followup appointment we will discuss sleep study results and how it is going with PAP treatment at home. Please advise patient to bring Her machine at the time of the first FU visit, even though this is cumbersome. Bringing the machine for every visit after that will likely not be needed, but often helps for the first visit to troubleshoot if needed. Please re-enforce the importance of compliance with treatment and the need for [place] to monitor compliance data - often an insurance requirement and actually good feedback for the patient as far as how they are doing.  Also remind patient, that any interim PAP machine or mask issues should be first addressed with the DME company, as they can often help better with technical and mask fit issues. Please ask if patient has a preference regarding DME company.  Please also make sure, the patient has a follow-up appointment with me in about 10 weeks from the setup date, thanks.  Once you have spoken to the patient - and faxed/routed report to PCP and referring MD (if other than PCP), you can close this encounter, thanks,   Star Age, MD, PhD [facility] ([place])

## 2017-02-28 NOTE — Procedures (Signed)
PATIENT'S NAME:  Kimberly Green, Kimberly Green DOB:      September 15, 1961      MR#:    [id number]     DATE OF RECORDING: 02/23/2017 REFERRING M.D.:  Arbie Cookey L. Evette Doffing, MD Study Performed:   CPAP  Titration HISTORY:  56 year old woman with a history of type 2 diabetes, hyperlipidemia, history of renal carcinoma, reflux disease, hypertension, COPD, bipolar disorder, adrenal tumor, and morbid obesity, who returns for a full night CPAP titration study following her PSG performed on 02/06/2017 with AHI of 12.9 and low spo2 of 72%. The patient endorsed the Epworth Sleepiness Scale at 10 points and the Fatigue Score at 63 points. The patient's weight 254 pounds with a height of 64 (inches), resulting in a BMI of 43.3 kg/m2. The patient's neck circumference measured 16 inches.  CURRENT MEDICATIONS: Albuterol, Aspirin, Cetirizine, Dulera, Farxiga, Metformin and Quetiapine  PROCEDURE:  This is a multichannel digital polysomnogram utilizing the SomnoStar 11.2 system.  Electrodes and sensors were applied and monitored per AASM Specifications.   EEG, EOG, Chin and Limb EMG, were sampled at 200 Hz.  ECG, Snore and Nasal Pressure, Thermal Airflow, Respiratory Effort, CPAP Flow and Pressure, Oximetry was sampled at 50 Hz. Digital video and audio were recorded.      The patient was fitted with an XS P10 nasal pillows interface. CPAP was initiated at 5 cmH20 with heated humidity per AASM standards and pressure was advanced to 10 cmH20 because of hypopneas, apneas and desaturations. She was noted to have oral venting during REM sleep and was tried on BiPAP of 10/6 cm. Her AHI on 10/6 cm was 0/hour, O2 nadir of 87%, non supine REM sleep achieved.  She slept on a wedge and propped up with pillows, as she sleeps in a recliner at home.  Lights Out was at 00:03 and Lights On at 06:41. Total recording time (TRT) was 398.5 minutes, with a total sleep time (TST) of 370.5 minutes. The patient's sleep latency was 7 minutes. REM latency was 76  minutes.  The sleep efficiency was 93. %.    SLEEP ARCHITECTURE: WASO (Wake after sleep onset)  was 21 minutes with minimal to mild sleep fragmentation noted.  There were 17.5 minutes in Stage N1, 135 minutes Stage N2, 119.5 minutes Stage N3 and 98.5 minutes in Stage REM.  The percentage of Stage N1 was 4.7%, Stage N2 was 36.4%, Stage N3 was 32.3%, which is increased, and Stage R (REM sleep) was 26.6%, which is mildly increased.  The arousals were noted as: 23 were spontaneous, 0 were associated with PLMs, 1 were associated with respiratory events. Audio and video analysis did not show any abnormal or unusual movements, behaviors, phonations or vocalizations.  The patient took no bathroom breaks. The EKG was in keeping with normal sinus rhythm (NSR).  RESPIRATORY ANALYSIS:  There was a total of 4 respiratory events: 0 obstructive apneas, 0 central apneas and 0 mixed apneas with a total of 0 apneas and an apnea index (AI) of 0 /hour. There were 4 hypopneas with a hypopnea index of .6/hour. The patient also had 1 respiratory event related arousals (RERAs).      The total APNEA/HYPOPNEA INDEX  (AHI) was .6 /hour and the total RESPIRATORY DISTURBANCE INDEX was .8 .hour  4 events occurred in REM sleep and 0 events in NREM. The REM AHI was 2.4 /hour versus a non-REM AHI of 0 /hour.  The patient spent 14 minutes of total sleep time in the supine position and 357  minutes in non-supine. The supine AHI was 0.0, versus a non-supine AHI of 0.7.  OXYGEN SATURATION & C02:  The baseline 02 saturation was 95%, with the lowest being 85%. Time spent below 89% saturation equaled 13 minutes.  PERIODIC LIMB MOVEMENTS: The patient had a total of 0 Periodic Limb Movements. The Periodic Limb Movement (PLM) index was 0 and the PLM Arousal index was 0 /hour.  Post-study, the patient indicated that sleep was better than usual.   IMPRESSION:  1. Obstructive Sleep Apnea(OSA) 2. Dysfunctions associated with sleep stages or  arousal from sleep  RECOMMENDATIONS: 1. This study demonstrates great improvement of the patient's obstructive sleep apnea with BiPAP therapy. I will, therefore, start the patient on home BiPAP treatment at a pressure of 10/6 cm via XS nasal pillows with heated humidity. The patient should be reminded to be fully compliant with PAP therapy to improve sleep related symptoms and decrease long term cardiovascular risks. The patient should be reminded, that it may take up to 3 months to get fully used to using PAP with all planned sleep. The earlier full compliance is achieved, the better long term compliance tends to be. Please note that untreated obstructive sleep apnea carries additional perioperative morbidity. Patients with significant obstructive sleep apnea should receive perioperative PAP therapy and the surgeons and particularly the anesthesiologist should be informed of the diagnosis and the severity of the sleep disordered breathing. 2. Other treatment options for OSA may include (generally speaking): avoidance of supine sleep position along with weight loss, upper airway or jaw surgery in selected patients or the use of an oral appliance in certain patients. ENT evaluation and/or consultation with a maxillofacial surgeon or dentist may be feasible in some instances.    3. This study shows some sleep fragmentation and mildly abnormal sleep stage percentages; these are nonspecific findings and per se do not signify an intrinsic sleep disorder or a cause for the patient's sleep-related symptoms. Causes include (but are not limited to) the first night effect of the sleep study, circadian rhythm disturbances, medication effect or an underlying mood disorder or medical problem.  4. The patient should be cautioned not to drive, work at heights, or operate dangerous or heavy equipment when tired or sleepy. Review and reiteration of good sleep hygiene measures should be pursued with any patient. 5. The patient  will be seen in follow-up by Dr. Rexene Alberts at [facility] for discussion of the test results and further management strategies. The referring provider will be notified of the test results.   I certify that I have reviewed the entire raw data recording prior to the issuance of this report in accordance with the Standards of Accreditation of the American Academy of Sleep Medicine (AASM)   Star Age, MD, PhD Diplomat, American Board of Psychiatry and Neurology (Neurology and Sleep Medicine)

## 2017-03-01 ENCOUNTER — Telehealth: Payer: Self-pay

## 2017-03-01 ENCOUNTER — Encounter: Payer: Self-pay | Admitting: Pediatrics

## 2017-03-01 ENCOUNTER — Other Ambulatory Visit: Payer: Self-pay | Admitting: Pediatrics

## 2017-03-01 ENCOUNTER — Ambulatory Visit (INDEPENDENT_AMBULATORY_CARE_PROVIDER_SITE_OTHER): Payer: Medicaid Other

## 2017-03-01 ENCOUNTER — Ambulatory Visit (INDEPENDENT_AMBULATORY_CARE_PROVIDER_SITE_OTHER): Payer: Medicaid Other | Admitting: Pediatrics

## 2017-03-01 VITALS — BP 103/67 | HR 81 | Temp 97.7°F | Ht 64.0 in | Wt 261.6 lb

## 2017-03-01 DIAGNOSIS — G8929 Other chronic pain: Secondary | ICD-10-CM

## 2017-03-01 DIAGNOSIS — M65332 Trigger finger, left middle finger: Secondary | ICD-10-CM

## 2017-03-01 DIAGNOSIS — J449 Chronic obstructive pulmonary disease, unspecified: Secondary | ICD-10-CM

## 2017-03-01 DIAGNOSIS — R52 Pain, unspecified: Secondary | ICD-10-CM

## 2017-03-01 DIAGNOSIS — M25561 Pain in right knee: Secondary | ICD-10-CM

## 2017-03-01 DIAGNOSIS — Z720 Tobacco use: Secondary | ICD-10-CM | POA: Diagnosis not present

## 2017-03-01 DIAGNOSIS — J385 Laryngeal spasm: Secondary | ICD-10-CM

## 2017-03-01 DIAGNOSIS — M545 Low back pain: Secondary | ICD-10-CM | POA: Diagnosis not present

## 2017-03-01 NOTE — Progress Notes (Signed)
Subjective:   Patient ID: Kimberly Green, female    DOB: Jan 27, 1961, 56 y.o.   MRN: [id number] CC: Knee Pain (right) and Foot Pain (right)  HPI: Kimberly Green is a 56 y.o. 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 Is worried that tumor may have come back Inhalers tend to make symptoms worse albuterol helps with wheezing when she has it  Sleep study positive for OSA, getting fit for machine  Knee pain R knee worse Weight bearing makes it hurt more  L hand middle finger continues to "get stuck" Using hands more to help with moving because of ongoing knee pain  Relevant past medical, surgical, family and social history reviewed. Allergies and medications reviewed and updated. History  Smoking Status  . Current Every Day Smoker  . Packs/day: 0.50  . Years: 38.00  . Types: Cigarettes  Smokeless Tobacco  . Never Used    Comment: Smokes 5 a day    ROS: Per HPI   Objective:    BP 103/67   Pulse 81   Temp 97.7 F (36.5 C) (Oral)   Ht 5\' 4"  (1.626 m)   Wt 261 lb 9.6 oz (118.7 kg)   LMP 10/15/2015   BMI 44.90 kg/m   Wt Readings from Last 3 Encounters:  03/01/17 261 lb 9.6 oz (118.7 kg)  02/22/17 258 lb 9.6 oz (117.3 kg)  02/15/17 261 lb (118.4 kg)    Gen: NAD, alert, cooperative with exam, NCAT EYES: EOMI, no conjunctival injection, or no icterus ENT:  TMs pearly gray b/l, OP without erythema LYMPH: no cervical LAD CV: NRRR, normal S1/S2, no murmur, distal pulses 2+ b/l Resp: CTABL, moving air well, normal WOB Ext: No edema, warm Neuro: Alert and oriented MSK: knee tender ant medial prox tibia, no joint  line tenderness, slight swelling compared with L knee pes anserine bursa, no crepitus L middle finger with popping after bending   Assessment & Plan:  Britten was seen today for knee pain and foot pain.  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 dulera caused GI complaints and worsening throat symptoms Cont albuterol prn  Tobacco use Cont to encourage cessation  Chronic bilateral low back pain without sciatica -     Ambulatory referral to Physical Therapy   PROCEDURE: Trigger finger injection: Verbal consent was obtained from the patient. Risks including infection, bleeding explained and contrasted with benefits and alternatives. Patient prepped with betadine. L thumb flexor tendon injected. The patient tolerated the procedure well. No complications. Injection: 0.80mL bupivacaine and  Kenalog 40 mg.  Needle: 27 gauge   Follow up plan: Return in about 4 weeks (around 03/29/2017). Assunta Found, MD [place]

## 2017-03-01 NOTE — Telephone Encounter (Signed)
I spoke to patient and she is aware of results and recommendations. She is willing to start treatment. Patient asked that we send orders to [facility]. Patient was able to make f/u appt.  Will send copy to PCP.

## 2017-03-01 NOTE — Telephone Encounter (Signed)
-----   Message from Star Age, MD sent at 02/28/2017  8:48 AM EDT ----- Patient referred by Dr. Evette Doffing, seen by me on 12/14/16, diagnostic PSG on 02/06/17, CPAP/BIPAP titration on 02/23/17:  Please call and inform patient that I have entered an order for treatment with positive airway pressure (PAP) treatment of obstructive sleep apnea (OSA). She did well during the latest sleep study with BiPAP. We will, therefore, arrange for a machine for home use through a DME (durable medical equipment) company of Her choice; and I will see the patient back in follow-up in about 10 weeks. Please also explain to the patient that I will be looking out for compliance data, which can be downloaded from the machine (stored on an SD card, that is inserted in the machine) or via remote access through a modem, that is built into the machine. At the time of the followup appointment we will discuss sleep study results and how it is going with PAP treatment at home. Please advise patient to bring Her machine at the time of the first FU visit, even though this is cumbersome. Bringing the machine for every visit after that will likely not be needed, but often helps for the first visit to troubleshoot if needed. Please re-enforce the importance of compliance with treatment and the need for [place] to monitor compliance data - often an insurance requirement and actually good feedback for the patient as far as how they are doing.  Also remind patient, that any interim PAP machine or mask issues should be first addressed with the DME company, as they can often help better with technical and mask fit issues. Please ask if patient has a preference regarding DME company.  Please also make sure, the patient has a follow-up appointment with me in about 10 weeks from the setup date, thanks.  Once you have spoken to the patient - and faxed/routed report to PCP and referring MD (if other than PCP), you can close this encounter, thanks,   Star Age,  MD, PhD [facility] ([place])

## 2017-03-09 ENCOUNTER — Encounter (INDEPENDENT_AMBULATORY_CARE_PROVIDER_SITE_OTHER): Payer: Self-pay | Admitting: Orthopaedic Surgery

## 2017-03-09 ENCOUNTER — Ambulatory Visit (INDEPENDENT_AMBULATORY_CARE_PROVIDER_SITE_OTHER): Payer: Medicaid Other | Admitting: Orthopaedic Surgery

## 2017-03-09 VITALS — BP 117/70 | HR 86 | Ht 64.0 in | Wt 261.0 lb

## 2017-03-09 DIAGNOSIS — M1711 Unilateral primary osteoarthritis, right knee: Secondary | ICD-10-CM

## 2017-03-09 DIAGNOSIS — M65332 Trigger finger, left middle finger: Secondary | ICD-10-CM

## 2017-03-09 MED ORDER — METHYLPREDNISOLONE ACETATE 40 MG/ML IJ SUSP
40.0000 mg | INTRAMUSCULAR | Status: AC | PRN
  Administered 2017-03-09: 40 mg via INTRA_ARTICULAR

## 2017-03-09 MED ORDER — LIDOCAINE HCL 1 % IJ SOLN
1.0000 mL | INTRAMUSCULAR | Status: AC | PRN
  Administered 2017-03-09: 1 mL

## 2017-03-09 MED ORDER — BUPIVACAINE HCL 0.25 % IJ SOLN
0.6600 mL | INTRAMUSCULAR | Status: AC | PRN
  Administered 2017-03-09: .66 mL via INTRA_ARTICULAR

## 2017-03-09 NOTE — Progress Notes (Signed)
Office Visit Note   Patient: Kimberly Green           Date of Birth: 05-20-1961           MRN: [id number] Visit Date: 03/09/2017              Requested by: Eustaquio Maize, MD [place], [place] [id number] PCP: Eustaquio Maize, MD   Assessment & Plan: Visit Diagnoses:  1. Unilateral primary osteoarthritis, right knee   2. Trigger finger, left middle finger     Plan: Injection placed in her right knee with some cortisone for her synovitis and primary knee osteoarthritis. She's never had an injection in her knee. Tolerated injection well. We discussed the surgical treatment for trigger finger she has continued triggering. This OB outpatient under local anesthesia and she can call should like to proceed. We'll plan a recheck her in 4 weeks. Patient has moderate severe osteoarthritis most significant in the medial compartment with significant joint narrowing, effusion. Hopefully she'll gets relief with the injection.  Follow-Up Instructions: Return in about 4 weeks (around 04/06/2017).   Orders:  Orders Placed This Encounter  Procedures  . Large Joint Injection/Arthrocentesis   No orders of the defined types were placed in this encounter.     Procedures: Large Joint Inj Date/Time: 03/09/2017 11:32 AM Performed by: Marybelle Killings Authorized by: Rodell Perna C   Consent Given by:  Patient Indications:  Pain and joint swelling Location:  Knee Site:  R knee Needle Size:  22 G Needle Length:  1.5 inches Approach:  Anterolateral Ultrasound Guidance: No   Fluoroscopic Guidance: No   Arthrogram: No   Medications:  1 mL lidocaine 1 %; 40 mg methylPREDNISolone acetate 40 MG/ML; 0.66 mL bupivacaine 0.25 % Patient tolerance:  Patient tolerated the procedure well with no immediate complications     Clinical Data: No additional findings.   Subjective: Chief Complaint  Patient presents with  . Right Knee - Pain    HPI patient is here with significant problems  with right knee medial pain. She's had difficulty walking aching has repetitive giving way she has to go down steps on her hands and knees or hands and feet since if she stands up and walks without putting her hands on the step she's concerned she will fall. She had another problem which is left long finger triggering: A1 pulley injection by Dr. and states it's continuing to lock on her. For knee issues used TI's Advil and aspirin.  Review of Systems 14 point review of systems positive for history of palpitations, COPD, depression, GERD, history of renal cell cancer, hypertension hyperlipidemia, left long finger locking with previous injection with persistent symptoms. Progressive right knee pain with giving way. Obstructive sleep apnea using BiPAP. Positive for diabetes , tobacco abuse, and obesity.   Objective: Vital Signs: BP 117/70   Pulse 86   Ht 5\' 4"  (1.626 m)   Wt 261 lb (118.4 kg)   LMP 10/15/2015   BMI 44.80 kg/m   Physical Exam  Constitutional: She is oriented to person, place, and time. She appears well-developed.  HENT:  Head: Normocephalic.  Right Ear: External ear normal.  Left Ear: External ear normal.  Eyes: Pupils are equal, round, and reactive to light.  Neck: No tracheal deviation present. No thyromegaly present.  Cardiovascular: Normal rate.   Pulmonary/Chest: Effort normal.  Abdominal: Soft.  Musculoskeletal:  Patient has good shoulder range of motion was reach full extension. Left  long finger is palpable nodule at the A1 pulley and she is able to demonstrate continued locking. Right knee shows more medial than lateral joint line pain with crepitus. She has good quad strength open with hip range of motion. Negative popliteal compression test tib-fib joint is normal as bursa was normal no palpable Baker's cyst distal pulses are 2+. EHL anterior tib is strong. No greater trochanter tenderness right or left.  Neurological: She is alert and oriented to person, place, and  time.  Skin: Skin is warm and dry.  Psychiatric: She has a normal mood and affect. Her behavior is normal.    Ortho Exam  Specialty Comments:  No specialty comments available.  Imaging: No results found.   PMFS History: Patient Active Problem List   Diagnosis Date Noted  . COPD (chronic obstructive pulmonary disease) ([place]) 11/17/2016  . Depression 11/17/2016  . GERD (gastroesophageal reflux disease) 11/17/2016  . History of renal cell cancer 11/17/2016  . Hyperlipidemia 11/17/2016  . Hypertension 11/17/2016  . Chest pain 07/06/2011  . DOE (dyspnea on exertion) 07/06/2011  . Palpitations 07/06/2011   Past Medical History:  Diagnosis Date  . Adrenal tumor, benign    S/P L adrenal resection, 10/11 ([place])  . Asthma   . Bipolar disorder ([place])   . Cholelithiasis   . COPD (chronic obstructive pulmonary disease) ([place])   . Diabetes mellitus without complication ([place])   . Essential hypertension, benign   . GERD (gastroesophageal reflux disease)   . IBS (irritable bowel syndrome)   . Mixed hyperlipidemia   . PTSD (post-traumatic stress disorder)   . Renal carcinoma ([place])    S/P partial R nephrectomy, 8/11 ([place])  . Sleep apnea     Family History  Problem Relation Age of Onset  . Coronary artery disease Mother 75  . Heart failure Father 74  . Colon cancer Other        Aunt  . Lung cancer Other        Aunt    Past Surgical History:  Procedure Laterality Date  . BACK SURGERY    . CHOLECYSTECTOMY    . KIDNEY MASS REMOVAL    . NECK SURGERY    . RIGHT WRIST SURGERY    . SHOULDER SURGERY Left   . TUBAL LIGATION     Social History   Occupational History  . Not on file.   Social History Main Topics  . Smoking status: Current Every Day Smoker    Packs/day: 0.50    Years: 38.00    Types: Cigarettes  . Smokeless tobacco: Never Used     Comment: Smokes 5 a day   . Alcohol use No  . Drug use: No  . Sexual activity: Not on file

## 2017-03-10 ENCOUNTER — Encounter: Payer: Self-pay | Admitting: Cardiovascular Disease

## 2017-03-10 ENCOUNTER — Ambulatory Visit (INDEPENDENT_AMBULATORY_CARE_PROVIDER_SITE_OTHER): Payer: Medicaid Other | Admitting: Cardiovascular Disease

## 2017-03-10 VITALS — BP 115/74 | HR 87 | Ht 64.0 in | Wt 259.0 lb

## 2017-03-10 DIAGNOSIS — R030 Elevated blood-pressure reading, without diagnosis of hypertension: Secondary | ICD-10-CM

## 2017-03-10 DIAGNOSIS — Z716 Tobacco abuse counseling: Secondary | ICD-10-CM

## 2017-03-10 DIAGNOSIS — R0609 Other forms of dyspnea: Secondary | ICD-10-CM

## 2017-03-10 DIAGNOSIS — IMO0001 Reserved for inherently not codable concepts without codable children: Secondary | ICD-10-CM

## 2017-03-10 DIAGNOSIS — Z136 Encounter for screening for cardiovascular disorders: Secondary | ICD-10-CM

## 2017-03-10 DIAGNOSIS — E785 Hyperlipidemia, unspecified: Secondary | ICD-10-CM

## 2017-03-10 NOTE — Patient Instructions (Signed)
Medication Instructions:  Continue all current medications.  Labwork: none  Testing/Procedures: none  Follow-Up: As needed.    Any Other Special Instructions Will Be Listed Below (If Applicable).  If you need a refill on your cardiac medications before your next appointment, please call your pharmacy.  

## 2017-03-10 NOTE — Progress Notes (Signed)
SUBJECTIVE: The patient returns for follow-up after undergoing cardiovascular testing performed for the evaluation of chest pain and exertional dyspnea.  She underwent a normal nuclear stress test on 02/28/17, LVEF 72%.  Echocardiogram on 02/27/17 demonstrated normal left ventricular systolic function and regional wall motion with mild LVH, LVEF 60-65%.  She has been referred by her PCP to ENT for laryngeal spasm.  She is down to smoking 5 cigarettes daily and says "I have no desire to quit ". She continues to have exertional dyspnea and a chronic cough.   Review of Systems: As per "subjective", otherwise negative.  Allergies  Allergen Reactions  . Lexiscan [Regadenoson] Shortness Of Breath    Severe transient respiratory distress    Current Outpatient Prescriptions  Medication Sig Dispense Refill  . albuterol (PROVENTIL HFA;VENTOLIN HFA) 108 (90 Base) MCG/ACT inhaler Inhale 1-2 puffs into the lungs every 6 (six) hours as needed for wheezing or shortness of breath. 1 Inhaler 0  . albuterol (PROVENTIL) (2.5 MG/3ML) 0.083% nebulizer solution Take 3 mLs (2.5 mg total) by nebulization every 6 (six) hours as needed. 75 mL 2  . aspirin EC 81 MG tablet Take 1 tablet (81 mg total) by mouth daily.    Marland Kitchen atorvastatin (LIPITOR) 20 MG tablet Take 1 tablet (20 mg total) by mouth daily. 30 tablet 6  . Blood Glucose Monitoring Suppl (ACCU-CHEK AVIVA PLUS) w/Device KIT Use to check BG bid 1 kit 0  . cetirizine (ZYRTEC) 10 MG tablet Take 1 tablet (10 mg total) by mouth daily. 34 tablet 2  . clopidogrel (PLAVIX) 75 MG tablet Take 75 mg by mouth daily.    Marland Kitchen FARXIGA 5 MG TABS tablet Take 10 mg by mouth daily. 60 tablet 0  . fluticasone (FLONASE) 50 MCG/ACT nasal spray Place 2 sprays into both nostrils daily.  6  . glucose blood (ACCU-CHEK AVIVA PLUS) test strip Use to check BG twice daily 100 each 2  . Lancets (ACCU-CHEK SOFT TOUCH) lancets Use to check BG twice a day 100 each 2  . metFORMIN  (GLUCOPHAGE) 1000 MG tablet Take 1 tablet (1,000 mg total) by mouth 2 (two) times daily with a meal. 60 tablet 0  . QUEtiapine (SEROQUEL) 100 MG tablet Take 1 tablet (100 mg total) by mouth at bedtime. 34 tablet 2   No current facility-administered medications for this visit.     Past Medical History:  Diagnosis Date  . Adrenal tumor, benign    S/P L adrenal resection, 10/11 ([place])  . Asthma   . Bipolar disorder ([place])   . Cholelithiasis   . COPD (chronic obstructive pulmonary disease) ([place])   . Diabetes mellitus without complication ([place])   . Essential hypertension, benign   . GERD (gastroesophageal reflux disease)   . IBS (irritable bowel syndrome)   . Mixed hyperlipidemia   . PTSD (post-traumatic stress disorder)   . Renal carcinoma ([place])    S/P partial R nephrectomy, 8/11 ([place])  . Sleep apnea     Past Surgical History:  Procedure Laterality Date  . BACK SURGERY    . CHOLECYSTECTOMY    . KIDNEY MASS REMOVAL    . NECK SURGERY    . RIGHT WRIST SURGERY    . SHOULDER SURGERY Left   . TUBAL LIGATION      Social History   Social History  . Marital status: Divorced    Spouse name: N/A  . Number of children: 2  . Years of education: 36  Occupational History  . Not on file.   Social History Main Topics  . Smoking status: Current Every Day Smoker    Packs/day: 0.25    Years: 38.00    Types: Cigarettes  . Smokeless tobacco: Never Used     Comment: Smokes 5 a day   . Alcohol use No  . Drug use: No  . Sexual activity: Not on file   Other Topics Concern  . Not on file   Social History Narrative   Drinks 1 cup of coffee a day      Vitals:   03/10/17 0915  BP: 115/74  Pulse: 87  SpO2: 96%  Weight: 259 lb (117.5 kg)  Height: '5\' 4"'$  (1.626 m)    Wt Readings from Last 3 Encounters:  03/10/17 259 lb (117.5 kg)  03/09/17 261 lb (118.4 kg)  03/01/17 261 lb 9.6 oz (118.7 kg)     PHYSICAL EXAM General: NAD HEENT: Normal. Neck: No JVD, no  thyromegaly. Lungs: Diminished throughout, faint rhonchi b/l CV: Nondisplaced PMI.  Regular rate and rhythm, normal S1/S2, no S3/S4, no murmur. No pretibial or periankle edema.  No carotid bruit.   Abdomen: Soft, nontender, no distention.  Neurologic: Alert and oriented.  Psych: Normal affect. Skin: Normal. Musculoskeletal: No gross deformities.    ECG: Most recent ECG reviewed.   Labs: Lab Results  Component Value Date/Time   K 4.1 10/19/2016 02:56 PM   BUN 8 10/19/2016 02:56 PM   CREATININE 0.72 10/19/2016 02:56 PM   ALT 41 (H) 10/19/2016 02:56 PM   TSH 3.030 12/24/2015 11:44 AM   HGB 14.1 05/26/2009 03:43 PM     Lipids: Lab Results  Component Value Date/Time   LDLCALC 139 (H) 11/22/2016 12:08 PM   CHOL 236 (H) 11/22/2016 12:08 PM   TRIG 325 (H) 11/22/2016 12:08 PM   HDL 32 (L) 11/22/2016 12:08 PM       ASSESSMENT AND PLAN: 1. Dyspnea on exertion and chest tightness: With normal nuclear stress test and normal left ventricular systolic function and regional wall motion, no further cardiovascular testing is indicated at this time. Symptoms possibly secondary to COPD and tobacco abuse. Encouraged to quit.  2. Elevated blood pressure: Blood pressure is normal today and was normal on 11/22/16. No therapy is indicated at this time.  3. Mixed dyslipidemia: I initiated Lipitor 20 mg at her last visit. Lipids should be repeated within the next few months.  4. Tobacco abuse disorder: Cessation counseling given (3 min). I explained the adverse effects of tobacco abuse on cardiovascular health   Disposition: Follow up prn  Kate Sable, M.D., F.A.C.C.

## 2017-03-14 ENCOUNTER — Ambulatory Visit: Payer: Medicaid Other | Attending: Pediatrics | Admitting: Physical Therapy

## 2017-03-21 ENCOUNTER — Other Ambulatory Visit: Payer: Self-pay | Admitting: Pediatrics

## 2017-03-21 DIAGNOSIS — E119 Type 2 diabetes mellitus without complications: Secondary | ICD-10-CM

## 2017-03-22 ENCOUNTER — Ambulatory Visit: Payer: Medicaid Other | Admitting: Pediatrics

## 2017-03-24 ENCOUNTER — Ambulatory Visit: Payer: Medicaid Other | Admitting: Pediatrics

## 2017-04-06 ENCOUNTER — Ambulatory Visit (INDEPENDENT_AMBULATORY_CARE_PROVIDER_SITE_OTHER): Payer: Medicaid Other | Admitting: Pediatrics

## 2017-04-06 ENCOUNTER — Encounter: Payer: Self-pay | Admitting: Pediatrics

## 2017-04-06 ENCOUNTER — Ambulatory Visit (INDEPENDENT_AMBULATORY_CARE_PROVIDER_SITE_OTHER): Payer: Medicaid Other | Admitting: Orthopaedic Surgery

## 2017-04-06 VITALS — BP 108/72 | HR 87 | Temp 97.8°F | Ht 64.0 in | Wt 258.2 lb

## 2017-04-06 DIAGNOSIS — Z720 Tobacco use: Secondary | ICD-10-CM

## 2017-04-06 DIAGNOSIS — G4730 Sleep apnea, unspecified: Secondary | ICD-10-CM | POA: Diagnosis not present

## 2017-04-06 DIAGNOSIS — M79672 Pain in left foot: Secondary | ICD-10-CM | POA: Diagnosis not present

## 2017-04-06 DIAGNOSIS — M79671 Pain in right foot: Secondary | ICD-10-CM

## 2017-04-06 DIAGNOSIS — E119 Type 2 diabetes mellitus without complications: Secondary | ICD-10-CM

## 2017-04-06 DIAGNOSIS — J385 Laryngeal spasm: Secondary | ICD-10-CM

## 2017-04-06 DIAGNOSIS — J449 Chronic obstructive pulmonary disease, unspecified: Secondary | ICD-10-CM

## 2017-04-06 NOTE — Progress Notes (Signed)
  Subjective:   Patient ID: Kimberly Green, female    DOB: [birth year], 56 y.o.   MRN: [id number] CC: Follow-up multiple med problems  HPI: Kimberly Green is a 56 y.o. female presenting for Follow-up and multiple med problems  Sleep apnea: hasnt yet gotten BiPAP machine Last talked with providing pharmacy 10 days ago, got more information, said they would would get back to you  Ongoing stress at home, says she feels like mood is doing ok  R knee pain: had injection with ortho, thinks it helped Continues to have pain in both feet at arches Wearing shoes with poor arch support  DM2: BGLs have been 98-120s  Had laryngeal spasm during recent stress test Rescheduled ENT appt for next month Continues to have spasms in her throat, something gets touched, hurts, she sometimes has lost consciousness No recent LOC with these episodes Does still sometimes feel pre-syncopal  Does have restrictive lung disease Was in nicu after birth with sepsis Has been diagnosed with asthma as a child Smoking 10 cig now On dulera Does think it helps with her breathing Taking zyrtec daily, helps with congestion a lot Albuterol helps when she is feeling SOB but will irritate her throat  Relevant past medical, surgical, family and social history reviewed. Allergies and medications reviewed and updated. History  Smoking Status  . Current Every Day Smoker  . Packs/day: 0.25  . Years: 38.00  . Types: Cigarettes  Smokeless Tobacco  . Never Used    Comment: Smokes 5 a day    ROS: Per HPI   Objective:    BP 108/72   Pulse 87   Temp 97.8 F (36.6 C) (Oral)   Ht 5\' 4"  (1.626 m)   Wt 258 lb 3.2 oz (117.1 kg)   LMP 10/15/2015   BMI 44.32 kg/m   Wt Readings from Last 3 Encounters:  04/06/17 258 lb 3.2 oz (117.1 kg)  03/10/17 259 lb (117.5 kg)  03/09/17 261 lb (118.4 kg)    Gen: NAD, alert, cooperative with exam, NCAT, some coughing EYES: EOMI, no conjunctival injection, or no  icterus ENT:  OP without erythema LYMPH: no cervical LAD CV: NRRR, normal S1/S2, no murmur, distal pulses 2+ b/l Resp: CTABL, normal WOB Abd: +BS, soft, NTND.  Ext: No edema, warm Neuro: Alert and oriented, strength equal b/l UE and LE, coordination grossly normal  Assessment & Plan:  Kimberly Green was seen today for follow-up and multiple med problems  Diagnoses and all orders for this visit:  Pain in both feet -     Ambulatory referral to Podiatry  Tobacco use Cont to encourage cessation, multiple home stressors now  Laryngeal spasm Has appt to see ENT upcoming  Diabetes mellitus without complication ([place]) AM BGLs improved, cont current meds  Chronic obstructive pulmonary disease, unspecified COPD type ([place]) Stable, no wheezing today  Sleep apnea, unspecified type Has not yet received bipap machine, pt to call to f/u  Follow up plan: Return in about 3 months (around 07/07/2017) for physical. Assunta Found, MD [place]

## 2017-04-10 ENCOUNTER — Telehealth: Payer: Self-pay | Admitting: Pediatrics

## 2017-04-10 DIAGNOSIS — F32A Depression, unspecified: Secondary | ICD-10-CM

## 2017-04-10 DIAGNOSIS — F329 Major depressive disorder, single episode, unspecified: Secondary | ICD-10-CM

## 2017-04-10 MED ORDER — QUETIAPINE FUMARATE 100 MG PO TABS: 100.0000 mg | ORAL_TABLET | Freq: Every day | ORAL | 2 refills | Status: DC

## 2017-04-10 NOTE — Telephone Encounter (Signed)
What is the name of the medication? Generic for seroquil  Have you contacted your pharmacy to request a refill? yes  Which pharmacy would you like this sent to?  [facility]   Patient notified that their request is being sent to the clinical staff for review and that they should receive a call once it is complete. If they do not receive a call within 24 hours they can check with their pharmacy or our office.

## 2017-04-10 NOTE — Telephone Encounter (Signed)
Refill has been sent to pharmacy.  Per Dr. Evette Doffing

## 2017-04-11 ENCOUNTER — Telehealth: Payer: Self-pay | Admitting: Pediatrics

## 2017-04-11 DIAGNOSIS — F32A Depression, unspecified: Secondary | ICD-10-CM

## 2017-04-11 DIAGNOSIS — F329 Major depressive disorder, single episode, unspecified: Secondary | ICD-10-CM

## 2017-04-11 MED ORDER — QUETIAPINE FUMARATE 100 MG PO TABS: 200.0000 mg | ORAL_TABLET | Freq: Every day | ORAL | 0 refills | Status: DC

## 2017-04-11 NOTE — Telephone Encounter (Signed)
States that she normally takes Seroquel 200mg . Since her current prescription was for 100mg  she was taking 2 qhs. Advised her that she should have contacted [place] before doubling up. She stated that she din't realize she was running out until she only had 2 tablets left.   Previous history only shows that we have prescribed 200mg  1 qhs. She said that she explained at her visit that she had been taking 200mg  because she had some from someone else.   She can't sleep with just 100mg  and would like for you to change the prescription to 200mg .

## 2017-04-11 NOTE — Telephone Encounter (Signed)
Dr Evette Doffing changed to 100mg  2 tablets at bedtime for this month. She should get this from her psych in the future. Patient notified and agreeable.

## 2017-04-14 ENCOUNTER — Other Ambulatory Visit: Payer: Self-pay | Admitting: Pediatrics

## 2017-04-14 DIAGNOSIS — F32A Depression, unspecified: Secondary | ICD-10-CM

## 2017-04-14 DIAGNOSIS — F329 Major depressive disorder, single episode, unspecified: Secondary | ICD-10-CM

## 2017-04-17 ENCOUNTER — Other Ambulatory Visit: Payer: Self-pay | Admitting: Pediatrics

## 2017-04-21 ENCOUNTER — Other Ambulatory Visit: Payer: Self-pay | Admitting: Pediatrics

## 2017-04-21 DIAGNOSIS — J302 Other seasonal allergic rhinitis: Secondary | ICD-10-CM

## 2017-04-21 DIAGNOSIS — J329 Chronic sinusitis, unspecified: Secondary | ICD-10-CM

## 2017-04-27 ENCOUNTER — Other Ambulatory Visit: Payer: Self-pay | Admitting: Pediatrics

## 2017-05-01 ENCOUNTER — Ambulatory Visit (INDEPENDENT_AMBULATORY_CARE_PROVIDER_SITE_OTHER): Payer: Medicaid Other | Admitting: Podiatry

## 2017-05-01 ENCOUNTER — Encounter: Payer: Self-pay | Admitting: Podiatry

## 2017-05-01 ENCOUNTER — Ambulatory Visit (INDEPENDENT_AMBULATORY_CARE_PROVIDER_SITE_OTHER): Payer: Medicaid Other

## 2017-05-01 ENCOUNTER — Other Ambulatory Visit: Payer: Self-pay

## 2017-05-01 DIAGNOSIS — M7670 Peroneal tendinitis, unspecified leg: Secondary | ICD-10-CM

## 2017-05-01 DIAGNOSIS — M2141 Flat foot [pes planus] (acquired), right foot: Secondary | ICD-10-CM | POA: Diagnosis not present

## 2017-05-01 DIAGNOSIS — M2142 Flat foot [pes planus] (acquired), left foot: Secondary | ICD-10-CM | POA: Diagnosis not present

## 2017-05-01 NOTE — Progress Notes (Signed)
   HPI: Patient presents today for treatment and evaluation of bilateral foot pain. Patient states that she's experienced pain off and on to her bilateral feet for approximately 1 year now. She works on her feet during long shifts and experiences significant amount of pain. Patient denies trauma. Patient has attempted no treatments at home to alleviate symptoms at the moment.   Physical Exam: General: The patient is alert and oriented x3 in no acute distress.  Dermatology: Skin is warm, dry and supple bilateral lower extremities. Negative for open lesions or macerations.  Vascular: Palpable pedal pulses bilaterally. No edema or erythema noted. Capillary refill within normal limits.  Neurological: Epicritic and protective threshold grossly intact bilaterally.   Musculoskeletal Exam: Diffuse pain on palpation throughout the bilateral foot and ankle structures of the bilateral lower extremities. Range of motion within normal limits to all pedal and ankle joints bilateral. Muscle strength 5/5 in all groups bilateral.   Radiographic Exam:  Normal osseous mineralization. Joint spaces preserved. No fracture/dislocation/boney destruction.    Assessment: 1. Bilateral foot pain 2. Peroneal tendinitis bilateral   Plan of Care:  1. Patient was evaluated. X-rays reviewed 2. Cesarean recommend good shoe gear with arch supports. 3. Recommend continuing over-the-counter Motrin when necessary 4. The patient does not want anti-inflammatory injections. She states that they do not work. She has had multiple injections in her knees and back. 5. Return to clinic when necessary   Edrick Kins, DPM [facility]  Dr. Edrick Kins, DPM    [address], [place] [address]                Office [phone]  Fax [phone]

## 2017-05-05 ENCOUNTER — Ambulatory Visit (INDEPENDENT_AMBULATORY_CARE_PROVIDER_SITE_OTHER): Payer: Medicaid Other | Admitting: *Deleted

## 2017-05-05 DIAGNOSIS — Z23 Encounter for immunization: Secondary | ICD-10-CM

## 2017-05-17 DIAGNOSIS — G4733 Obstructive sleep apnea (adult) (pediatric): Secondary | ICD-10-CM | POA: Diagnosis not present

## 2017-05-22 ENCOUNTER — Telehealth: Payer: Self-pay

## 2017-05-22 NOTE — Telephone Encounter (Signed)
I called pt. She just started her cpap on 05/17/17 and should follow up between 30 and 90 days post cpap start. Pt is agreeable to cancelling appt tomorrow and we made an appt for 07/18/17 at 10:30am. Pt verbalized understanding of this appt date and time and to arrive 15 mins early and bring her cpap.

## 2017-05-23 ENCOUNTER — Ambulatory Visit: Payer: Self-pay | Admitting: Neurology

## 2017-06-08 ENCOUNTER — Ambulatory Visit (INDEPENDENT_AMBULATORY_CARE_PROVIDER_SITE_OTHER): Payer: Medicaid Other | Admitting: Orthopaedic Surgery

## 2017-06-10 DIAGNOSIS — G4733 Obstructive sleep apnea (adult) (pediatric): Secondary | ICD-10-CM | POA: Diagnosis not present

## 2017-06-15 ENCOUNTER — Ambulatory Visit (INDEPENDENT_AMBULATORY_CARE_PROVIDER_SITE_OTHER): Payer: Medicaid Other | Admitting: Orthopaedic Surgery

## 2017-06-15 ENCOUNTER — Encounter: Payer: Self-pay | Admitting: Neurology

## 2017-06-16 ENCOUNTER — Telehealth: Payer: Self-pay | Admitting: Pediatrics

## 2017-06-16 MED ORDER — IVERMECTIN 0.5 % EX LOTN: TOPICAL_LOTION | CUTANEOUS | 0 refills | Status: DC

## 2017-06-16 NOTE — Telephone Encounter (Signed)
Answering calls for PCP while they are out,   [facility] sent to [place].   Laroy Apple, MD [facility] 06/16/2017, 2:40 PM

## 2017-06-16 NOTE — Addendum Note (Signed)
Addended by: Zannie Cove on: 06/16/2017 03:12 PM   Modules accepted: Orders

## 2017-06-16 NOTE — Telephone Encounter (Signed)
Changed to [facility] per pt preference laynes notified

## 2017-07-07 ENCOUNTER — Encounter: Payer: Medicaid Other | Admitting: Pediatrics

## 2017-07-10 ENCOUNTER — Other Ambulatory Visit: Payer: Self-pay | Admitting: Pediatrics

## 2017-07-10 ENCOUNTER — Telehealth: Payer: Self-pay | Admitting: Pediatrics

## 2017-07-10 ENCOUNTER — Other Ambulatory Visit: Payer: Self-pay | Admitting: *Deleted

## 2017-07-10 DIAGNOSIS — F329 Major depressive disorder, single episode, unspecified: Secondary | ICD-10-CM

## 2017-07-10 DIAGNOSIS — F32A Depression, unspecified: Secondary | ICD-10-CM

## 2017-07-10 DIAGNOSIS — G4733 Obstructive sleep apnea (adult) (pediatric): Secondary | ICD-10-CM | POA: Diagnosis not present

## 2017-07-10 MED ORDER — QUETIAPINE FUMARATE 100 MG PO TABS: 200.0000 mg | ORAL_TABLET | Freq: Every day | ORAL | 0 refills | Status: DC

## 2017-07-10 NOTE — Telephone Encounter (Signed)
What is the name of the medication? QUEtiapine (SEROQUEL) 100 MG tablet  Have you contacted your pharmacy to request a refill? Yes  Which pharmacy would you like this sent to? [facility]. Pt is completely out   Patient notified that their request is being sent to the clinical staff for review and that they should receive a call once it is complete. If they do not receive a call within 24 hours they can check with their pharmacy or our office.

## 2017-07-10 NOTE — Telephone Encounter (Signed)
Appt made

## 2017-07-11 ENCOUNTER — Telehealth: Payer: Self-pay

## 2017-07-11 ENCOUNTER — Telehealth: Payer: Self-pay | Admitting: Pediatrics

## 2017-07-11 NOTE — Telephone Encounter (Signed)
Per pt she has never taken Plavix Pt wants med removed from med list Medication removed per pt request

## 2017-07-11 NOTE — Telephone Encounter (Signed)
Pharmacy made aware and verbalized understanding.

## 2017-07-11 NOTE — Telephone Encounter (Signed)
Pharmacy called wanting to know if patient is supposed to be on plavix and are we filling it? I see where plavix is on her list, but I do not see where we started it if we did

## 2017-07-11 NOTE — Telephone Encounter (Signed)
I didn't initiate, so we are not filling. She is a prn follow up.

## 2017-07-18 ENCOUNTER — Ambulatory Visit: Payer: Self-pay | Admitting: Neurology

## 2017-07-19 ENCOUNTER — Ambulatory Visit (INDEPENDENT_AMBULATORY_CARE_PROVIDER_SITE_OTHER): Payer: Medicaid Other

## 2017-07-19 ENCOUNTER — Encounter: Payer: Self-pay | Admitting: Pediatrics

## 2017-07-19 ENCOUNTER — Ambulatory Visit (INDEPENDENT_AMBULATORY_CARE_PROVIDER_SITE_OTHER): Payer: Medicaid Other | Admitting: Pediatrics

## 2017-07-19 VITALS — BP 121/75 | HR 79 | Temp 98.2°F | Ht 64.0 in | Wt 267.6 lb

## 2017-07-19 DIAGNOSIS — E785 Hyperlipidemia, unspecified: Secondary | ICD-10-CM | POA: Diagnosis not present

## 2017-07-19 DIAGNOSIS — R5383 Other fatigue: Secondary | ICD-10-CM | POA: Diagnosis not present

## 2017-07-19 DIAGNOSIS — M5442 Lumbago with sciatica, left side: Secondary | ICD-10-CM

## 2017-07-19 DIAGNOSIS — M542 Cervicalgia: Secondary | ICD-10-CM

## 2017-07-19 DIAGNOSIS — F329 Major depressive disorder, single episode, unspecified: Secondary | ICD-10-CM | POA: Diagnosis not present

## 2017-07-19 DIAGNOSIS — E119 Type 2 diabetes mellitus without complications: Secondary | ICD-10-CM

## 2017-07-19 DIAGNOSIS — I10 Essential (primary) hypertension: Secondary | ICD-10-CM | POA: Diagnosis not present

## 2017-07-19 DIAGNOSIS — G8929 Other chronic pain: Secondary | ICD-10-CM

## 2017-07-19 DIAGNOSIS — F32A Depression, unspecified: Secondary | ICD-10-CM

## 2017-07-19 LAB — BAYER DCA HB A1C WAIVED: HB A1C (BAYER DCA - WAIVED): 6.7 % (ref <7.0)

## 2017-07-19 MED ORDER — QUETIAPINE FUMARATE 100 MG PO TABS: 200.0000 mg | ORAL_TABLET | Freq: Every day | ORAL | 5 refills | Status: DC

## 2017-07-19 NOTE — Progress Notes (Signed)
  Subjective:   Patient ID: Kimberly Green, female    DOB: 12-02-1960, 56 y.o.   MRN: [id number] CC: Medication Management (seraquil)  HPI: Kimberly Green is a 56 y.o. female presenting for Medication Management (seraquil)  DM2: when she has a pepsi, about once a week, has elevated blood sugar  ADHD: on adderall, following with [facility]  Insomnia: takes seroquel '200mg'$  Thinks also helps with her mood a lot Notices when she doesn't have it  Has headaches daily Sometimes wakes up with it when her CPAP falls off Sometimes feels like hornets are in her head Ibuprofen helps with HA Ongoing neck pain for months to years  L leg has been giving out with walking Radiates from tailbone to foot Weakness L leg ongoing since 2012 Getting worse, sometimes gives out Now if sits or stands for long periods  H/o back surgery, had a "cage" put in  Smoking daily Not ready to quit  Relevant past medical, surgical, family and social history reviewed. Allergies and medications reviewed and updated. History  Smoking Status  . Current Every Day Smoker  . Packs/day: 0.25  . Years: 38.00  . Types: Cigarettes  Smokeless Tobacco  . Never Used    Comment: Smokes 5 a day    ROS: Per HPI   Objective:    BP 121/75   Pulse 79   Temp 98.2 F (36.8 C) (Oral)   Ht '5\' 4"'$  (1.626 m)   Wt 267 lb 9.6 oz (121.4 kg)   LMP 10/15/2015   BMI 45.93 kg/m   Wt Readings from Last 3 Encounters:  07/19/17 267 lb 9.6 oz (121.4 kg)  04/06/17 258 lb 3.2 oz (117.1 kg)  03/10/17 259 lb (117.5 kg)    Gen: NAD, alert, cooperative with exam, NCAT EYES: EOMI, no conjunctival injection, or no icterus ENT: OP without erythema LYMPH: no cervical LAD CV: NRRR, normal S1/S2, no murmur Resp: CTABL, no wheezes, normal WOB Abd: +BS, soft, NTND. no guarding or organomegaly Ext: No edema, warm Neuro: Alert and oriented MSK: 4/5 strength b/l hand grip  Assessment & Plan:  Shauntay was seen today for  medication management.  Diagnoses and all orders for this visit:  Neck pain Avoid activities that make pain worse -     DG Cervical Spine Complete; Future  Depression, unspecified depression type Stable, cont below -     QUEtiapine (SEROQUEL) 100 MG tablet; Take 2 tablets (200 mg total) by mouth at bedtime.  Chronic bilateral low back pain with left-sided sciatica No red flag symptoms Will get xray -     DG Lumbar Spine 2-3 Views; Future  Hyperlipidemia, unspecified hyperlipidemia type Stable On atorvastatin Cont current med  Essential hypertension Not currently on medications, adequate control -     BMP8+EGFR  Other fatigue -     TSH -     CBC with Differential/Platelet  Diabetes mellitus without complication (HCC) Cont metformin -     Bayer DCA Hb A1c Waived   Follow up plan: Return in about 3 months (around 10/19/2017). Assunta Found, MD [place]

## 2017-07-21 LAB — BMP8+EGFR
BUN / CREAT RATIO: 11 (ref 9–23)
BUN: 8 mg/dL (ref 6–24)
CHLORIDE: 107 mmol/L — AB (ref 96–106)
CO2: 19 mmol/L — ABNORMAL LOW (ref 20–29)
CREATININE: 0.74 mg/dL (ref 0.57–1.00)
Calcium: 9.8 mg/dL (ref 8.7–10.2)
GFR, EST AFRICAN AMERICAN: 106 mL/min/{1.73_m2} (ref >59)
GFR, EST NON AFRICAN AMERICAN: 92 mL/min/{1.73_m2} (ref >59)
Glucose: 92 mg/dL (ref 65–99)
Potassium: 4.2 mmol/L (ref 3.5–5.2)
Sodium: 144 mmol/L (ref 134–144)

## 2017-07-21 LAB — CBC WITH DIFFERENTIAL/PLATELET
BASOS: 1 %
Basophils Absolute: 0 10*3/uL (ref 0.0–0.2)
EOS (ABSOLUTE): 0.3 10*3/uL (ref 0.0–0.4)
EOS: 3 %
Hematocrit: 43.9 % (ref 34.0–46.6)
Hemoglobin: 14.3 g/dL (ref 11.1–15.9)
IMMATURE GRANULOCYTES: 1 %
Immature Grans (Abs): 0 10*3/uL (ref 0.0–0.1)
LYMPHS: 32 %
Lymphocytes Absolute: 2.7 10*3/uL (ref 0.7–3.1)
MCH: 29 pg (ref 26.6–33.0)
MCHC: 32.6 g/dL (ref 31.5–35.7)
MCV: 89 fL (ref 79–97)
MONOCYTES: 4 %
Monocytes Absolute: 0.4 10*3/uL (ref 0.1–0.9)
NEUTROS ABS: 5.1 10*3/uL (ref 1.4–7.0)
NEUTROS PCT: 59 %
PLATELETS: 278 10*3/uL (ref 150–379)
RBC: 4.93 x10E6/uL (ref 3.77–5.28)
RDW: 13.6 % (ref 12.3–15.4)
WBC: 8.6 10*3/uL (ref 3.4–10.8)

## 2017-07-21 LAB — TSH: TSH: 4.43 u[IU]/mL (ref 0.450–4.500)

## 2017-07-28 ENCOUNTER — Other Ambulatory Visit: Payer: Self-pay | Admitting: Pediatrics

## 2017-08-03 DIAGNOSIS — Z1231 Encounter for screening mammogram for malignant neoplasm of breast: Secondary | ICD-10-CM | POA: Diagnosis not present

## 2017-08-10 ENCOUNTER — Telehealth: Payer: Self-pay

## 2017-08-10 ENCOUNTER — Encounter: Payer: Self-pay | Admitting: Neurology

## 2017-08-10 ENCOUNTER — Ambulatory Visit: Payer: Self-pay | Admitting: Neurology

## 2017-08-10 DIAGNOSIS — G4733 Obstructive sleep apnea (adult) (pediatric): Secondary | ICD-10-CM | POA: Diagnosis not present

## 2017-08-10 NOTE — Telephone Encounter (Signed)
Patient was a no show for appt today.

## 2017-08-15 ENCOUNTER — Encounter: Payer: Self-pay | Admitting: Neurology

## 2017-08-17 ENCOUNTER — Telehealth: Payer: Self-pay | Admitting: Pediatrics

## 2017-08-17 MED ORDER — FLUCONAZOLE 150 MG PO TABS: 150.0000 mg | ORAL_TABLET | Freq: Once | ORAL | 0 refills | Status: DC

## 2017-08-17 MED ORDER — FLUCONAZOLE 150 MG PO TABS: 150.0000 mg | ORAL_TABLET | Freq: Once | ORAL | 0 refills | Status: AC

## 2017-08-17 NOTE — Telephone Encounter (Signed)
Patient aware medication has sent to pharmacy

## 2017-08-30 ENCOUNTER — Encounter: Payer: Self-pay | Admitting: Neurology

## 2017-08-30 ENCOUNTER — Ambulatory Visit (INDEPENDENT_AMBULATORY_CARE_PROVIDER_SITE_OTHER): Payer: Medicaid Other | Admitting: Neurology

## 2017-08-30 VITALS — BP 116/78 | HR 79 | Ht 64.0 in | Wt 262.0 lb

## 2017-08-30 DIAGNOSIS — Z9989 Dependence on other enabling machines and devices: Secondary | ICD-10-CM | POA: Diagnosis not present

## 2017-08-30 DIAGNOSIS — G4733 Obstructive sleep apnea (adult) (pediatric): Secondary | ICD-10-CM

## 2017-08-30 NOTE — Patient Instructions (Addendum)
Keep up the good work! We will see you back in 6 months for sleep apnea check up, and hopefully, once you get your dentures and adjust to them, you can get back on track with the CPAP.   Please continue using your CPAP regularly. While your insurance requires that you use CPAP at least 4 hours each night on 70% of the nights, I recommend, that you not skip any nights and use it throughout the night if you can. Getting used to CPAP and staying with the treatment long term does take time and patience and discipline. Untreated obstructive sleep apnea when it is moderate to severe can have an adverse impact on cardiovascular health and raise her risk for heart disease, arrhythmias, hypertension, congestive heart failure, stroke and diabetes. Untreated obstructive sleep apnea causes sleep disruption, nonrestorative sleep, and sleep deprivation. This can have an impact on your day to day functioning and cause daytime sleepiness and impairment of cognitive function, memory loss, mood disturbance, and problems focussing. Using CPAP regularly can improve these symptoms.

## 2017-08-30 NOTE — Progress Notes (Signed)
Subjective:    Patient ID: Kimberly Green is a 56 y.o. female.  HPI     Interim history:   Kimberly Green is a 56 year old right-handed woman with an underlying medical history of type 2 diabetes, hyperlipidemia, history of renal carcinoma, reflux disease, hypertension, COPD, bipolar disorder, adrenal tumor, and morbid obesity, who presents for follow-up consultation of her obstructive sleep apnea, after recent sleep study testing and starting BiPAP therapy. Of note, the patient no showed for an appointment on 08/10/2017. The patient is unaccompanied today. I first met her on 12/14/2016 at the request of her primary care physician, at which time she reported snoring and excessive daytime somnolence as well as morning headaches. She had difficulty initiating and maintaining sleep for years. I suggested we proceed with sleep study testing. She had a baseline sleep study, followed by a CPAP titration study. I went over her test results with her in detail today. Baseline sleep study from 02/06/2017 showed a sleep efficiency of 80%, sleep latency of 71 minutes, REM latency markedly prolonged at 298.5 minutes. She had an increased percentage of stage II sleep, slow-wave sleep was absent, REM sleep was 15.5%. Total AHI was 12.9 per hour, REM AHI 26.8 per hour, supine AHI 25.3 per hour. Average oxygen saturation of 94%, nadir of 72%. Time below 89% saturation was 153 minutes. Based on her test results and her medical history I suggested we proceed with a full night CPAP titration study. She had this on 02/23/2017. Sleep latency was 7 minutes, REM latency 76 minutes, sleep efficiency 93%. She had an increased percentage of slow-wave sleep at an increased percentage of REM sleep. She was fitted with nasal pillows and CPAP was titrated from 5 cm to 10 cm. She had some oral venting. This improved on standard BiPAP of 10/6 cm. On this setting her AHI was 0 per hour, O2 nadir of 87%, nonsupine REM sleep was  achieved. Based on her test results I prescribed BiPAP therapy for home use.  Today, 08/30/2017: She has felt much improved when she first started her CPAP. She had to have her teeth extracted. She is waiting for her dentures to be properly fitted. Unfortunately, she has not been able to use her BiPAP since her tooth extraction as she is still hurting. Her mask does not fit as well any longer. She was compliant in the first month of using BiPAP. She felt improved. She felt better rested, more daytime energy, less sleep interruption, is hoping to get back on track.  I reviewed her BiPAP compliance data from 05/17/2017 through 06/15/2017, which is a total of 30 days, during which time she used her BiPAP every night with percent used days greater than 4 hours of 80%, indicating very good compliance, average usage of 5 hours and 12 minutes, residual AHI 0.4 per hour, leak on the high side with the 95th percentile at 32.4 L/m on a pressure of 10/6.   I reviewed her BiPAP compliance data from 07/08/2017 through 08/06/2017 which is a total of 30 days, during which time she used her BiPAP only 19 days with percent used days greater than 4 hours of 23%, much declined compliance.   Previously (copied from previous notes for reference):   12/14/2016: (She) who reports snoring, morning HAs and excessive daytime somnolence. She had a sleep study in 2012, interpreted by Dr. Merlene Laughter. I reviewed the nocturnal polysomnogram from 07/02/2011: Sleep efficiency was 79.6%, sleep latency was 41 minutes, REM latency was significantly prolonged at  351.5 minutes she had an increased percentage of stage I sleep, an increased percentage of stage II sleep, absence of slow-wave sleep and REM sleep was reduced at 6.7%. Average oxygen saturation was 95%, nadir was 85%. AHI was 2.3 per hour, she had no significant PLMS. I reviewed your office note from 11/22/2016. Her Epworth sleepiness score is 10 out of 24 today, her fatigue score is  63 out of 63. She is status post multiple surgeries including gallbladder surgery, low back surgery, neck surgery, left shoulder surgery, right wrist surgery, kidney cancer surgery in 2012 (right lower pole). She reports difficulty initiating and maintaining sleep for years. She has been tried on prescription sleep aids including Ambien, Lunesta, Xanax, Valium, and more recently she has been placed on Seroquel again. Often and she has been on Seroquel since 2011. The time is between 9 and 11 PM, wakeup time is between 5:55 AM. She has nocturia once per night on average, she has occasional morning headaches which are frontal and bilateral. She lives with a friend, in addition, temporarily, her younger daughter has moved in with her family including her boyfriend, and 3 children ages 47, 46 and 41 weeks. The patient has reduced her cigarette smoking and smokes about 5 cigarettes per day on average, she does not drink alcohol or use illicit drugs her narcotic pain medication. She has reduced her soda intake since she was recently diagnosed with diabetes. She drinks diet and caffeine free [facility] as well as 1 large cup of coffee in the morning, no tea. She denies any frank restless leg symptoms but had transient restless leg symptoms in the past secondary to medication. She currently does not work. She used to work as a Microbiologist at [employer], also in the past she worked as a Quarry manager and she has worked in [employer]. She has had 3 back surgeries and still has low back pain radiating to the left, she has not been able to sleep in a bed for years. She sleeps in her recliner. She has a long-standing history of mood disorder, she will be establishing care with a new psychiatrist in [place] on 12/24/2016. She is also followed by oncology for her history of kidney cancer and there has been a cyst on the right kidney which they are following. Her snoring is loud enough to be heard on the other side of the house. She  has woken up with severe sore throat and swollen uvula. She has woken up with a sudden jolt.  Her Past Medical History Is Significant For: Past Medical History:  Diagnosis Date  . Adrenal tumor, benign    S/P L adrenal resection, 10/11 ([place])  . Asthma   . Bipolar disorder ([place])   . Cholelithiasis   . COPD (chronic obstructive pulmonary disease) ([place])   . Diabetes mellitus without complication ([place])   . Essential hypertension, benign   . GERD (gastroesophageal reflux disease)   . IBS (irritable bowel syndrome)   . Mixed hyperlipidemia   . PTSD (post-traumatic stress disorder)   . Renal carcinoma ([place])    S/P partial R nephrectomy, 8/11 ([place])  . Sleep apnea     Her Past Surgical History Is Significant For: Past Surgical History:  Procedure Laterality Date  . BACK SURGERY    . CHOLECYSTECTOMY    . KIDNEY MASS REMOVAL    . NECK SURGERY    . RIGHT WRIST SURGERY    . SHOULDER SURGERY Left   . TUBAL LIGATION  Her Family History Is Significant For: Family History  Problem Relation Age of Onset  . Coronary artery disease Mother 65  . Heart failure Father 42  . Colon cancer Other        Aunt  . Lung cancer Other        Aunt    Her Social History Is Significant For: Social History   Socioeconomic History  . Marital status: Divorced    Spouse name: None  . Number of children: 2  . Years of education: 51  . Highest education level: None  Social Needs  . Financial resource strain: None  . Food insecurity - worry: None  . Food insecurity - inability: None  . Transportation needs - medical: None  . Transportation needs - non-medical: None  Occupational History  . None  Tobacco Use  . Smoking status: Current Every Day Smoker    Packs/day: 0.25    Years: 38.00    Pack years: 9.50    Types: Cigarettes  . Smokeless tobacco: Never Used  . Tobacco comment: Smokes 5 a day   Substance and Sexual Activity  . Alcohol use: No  . Drug use: No  . Sexual activity:  None  Other Topics Concern  . None  Social History Narrative   Drinks 1 cup of coffee a day     Her Allergies Are:  Allergies  Allergen Reactions  . Lexiscan [Regadenoson] Shortness Of Breath    Severe transient respiratory distress  . Hydrocodone Hives  :   Her Current Medications Are:  Outpatient Encounter Medications as of 08/30/2017  Medication Sig  . albuterol (PROVENTIL HFA;VENTOLIN HFA) 108 (90 Base) MCG/ACT inhaler Inhale 1-2 puffs into the lungs every 6 (six) hours as needed for wheezing or shortness of breath.  Marland Kitchen albuterol (PROVENTIL) (2.5 MG/3ML) 0.083% nebulizer solution Take 3 mLs (2.5 mg total) by nebulization every 6 (six) hours as needed.  Marland Kitchen amphetamine-dextroamphetamine (ADDERALL) 10 MG tablet TAKE 1 TABLET BY MOUTH EVERY MORNING AND TAKE 1 TABLET BY MOUTH EVERY DAY AT NOON - MAY FILL IN JULY  . aspirin EC 81 MG tablet Take 1 tablet (81 mg total) by mouth daily.  . Blood Glucose Monitoring Suppl (ACCU-CHEK AVIVA PLUS) w/Device KIT Use to check BG bid  . cetirizine (ZYRTEC) 10 MG tablet TAKE 1 TABLET BY MOUTH DAILY  . DULERA 100-5 MCG/ACT AERO INHALE 2 PUFFS BY MOUTH TWICE DAILY  . FARXIGA 5 MG TABS tablet TAKE ONE TABLET BY MOUTH TWICE DAILY  . glucose blood (ACCU-CHEK AVIVA PLUS) test strip Use to check BG twice daily  . Ivermectin 0.5 % LOTN Apply to dry scalp,  Leave on for 10 minutes, rinse thoroughly with warm water.  . Lancets (ACCU-CHEK SOFT TOUCH) lancets Use to check BG twice a day  . metFORMIN (GLUCOPHAGE) 1000 MG tablet TAKE ONE TABLET BY MOUTH TWICE DAILY  . QUEtiapine (SEROQUEL) 100 MG tablet Take 2 tablets (200 mg total) by mouth at bedtime.  Marland Kitchen atorvastatin (LIPITOR) 20 MG tablet Take 1 tablet (20 mg total) by mouth daily.   No facility-administered encounter medications on file as of 08/30/2017.   :  Review of Systems:  Out of a complete 14 point review of systems, all are reviewed and negative with the exception of these symptoms as listed  below:   Review of Systems  Neurological:       Pt presents today to discuss her cpap. Pt had her teeth removed recently which has caused the air  from the cpap to cause dry mouth and a poor mask seal. Pt is using [facility] as her DME.    Objective:  Neurological Exam  Physical Exam Physical Examination:   Vitals:   08/30/17 1041  BP: 116/78  Pulse: 79   General Examination: The patient is a very pleasant 56 y.o. female in no acute distress. She appears well-developed and well-nourished and well groomed.   HEENT: Normocephalic, atraumatic, pupils are equal, round and reactive to light and accommodation. Etraocular tracking is good without limitation to gaze excursion or nystagmus noted. Normal smooth pursuit is noted. Hearing is grossly intact. Face is symmetric with normal facial animation and normal facial sensation. Speech is clear with no dysarthria noted. There is no hypophonia. There is no lip, neck/head, jaw or voice tremor. Neck is supple with full range of passive and active motion. Oropharynx exam reveals: mild mouth dryness, nearly edentulous state, moderate airway crowding. She has 2 teeth on the bottom only. Gums are healing. Tongue protrudes centrally and palate elevates symmetrically. Neck size is 16 inches. She has a Moderate overbite. Nasal inspection reveals no significant nasal mucosal bogginess or redness and no septal deviation.   Chest: Clear to auscultation without wheezing, rhonchi or crackles noted.  Heart: S1+S2+0, regular and normal without murmurs, rubs or gallops noted.   Abdomen: Soft, non-tender and non-distended with normal bowel sounds appreciated on auscultation.  Extremities: There is trace pitting edema in the distal lower extremities bilaterally. Pedal pulses are intact.  Skin: Warm and dry without trophic changes noted.  Musculoskeletal: exam reveals no obvious joint deformities, tenderness or joint swelling or erythema.    Neurologically:   Mental status: The patient is awake, alert and oriented in all 4 spheres. Her immediate and remote memory, attention, language skills and fund of knowledge are appropriate. There is no evidence of aphasia, agnosia, apraxia or anomia. Speech is clear with normal prosody and enunciation. Thought process is linear. Mood is normal and affect is normal.  Cranial nerves II - XII are as described above under HEENT exam. Motor exam: Normal bulk, strength and tone is noted. There is no drift, tremor or rebound. Romberg is negative. Reflexes are 1-2+ throughout. Fine motor skills and coordination: intact with normal finger taps, normal hand movements, normal rapid alternating patting, normal foot taps and normal foot agility.  Cerebellar testing: No dysmetria or intention tremor. Heel to shin is unremarkable bilaterally. There is no truncal or gait ataxia.  Sensory exam: intact to LT in the upper and lower extremities.  Gait, station and balance: She stands easily. No veering to one side is noted. No leaning to one side is noted. Posture is age-appropriate and stance is narrow based. Gait shows normal stride length and normal pace. No problems turning are noted.                Assessment and Plan:  In summary, Kimberly Green is a very pleasant 56 year old female with an underlying medical history of type 2 diabetes, hyperlipidemia, history of renal carcinoma, reflux disease, hypertension, COPD, bipolar disorder, adrenal tumor, and morbid obesity, who presents for follow up consultation of her OSA. She has established treatment with standard BiPAP therapy on a pressure of 10/6 cm with initially very good results in very good compliance. She had a setback after she had her teeth removed. She is struggling with the seal right now and also with residual pain. She is hopeful that she can can get back on  treatment as soon as possible. She is hoping to get her dentures soon. It will need  some adjustment of course. She is commended for her treatment adherence. We talked about her sleep study results in detail today and reviewed her compliance data as well. Physical exam is otherwise stable. She is trying to quit smoking. She is encouraged to keep working on it as well. I suggested a routine six-month follow-up with one of our nurse practitioners. I answered all her questions today and she was in agreement.  I spent 25 minutes in total face-to-face time with the patient, more than 50% of which was spent in counseling and coordination of care, reviewing test results, reviewing medication and discussing or reviewing the diagnosis of OSA, its prognosis and treatment options. Pertinent laboratory and imaging test results that were available during this visit with the patient were reviewed by me and considered in my medical decision making (see chart for details).

## 2017-09-09 DIAGNOSIS — G4733 Obstructive sleep apnea (adult) (pediatric): Secondary | ICD-10-CM | POA: Diagnosis not present

## 2017-10-10 ENCOUNTER — Other Ambulatory Visit: Payer: Self-pay | Admitting: Cardiovascular Disease

## 2017-10-10 ENCOUNTER — Other Ambulatory Visit: Payer: Self-pay | Admitting: Pediatrics

## 2017-10-10 DIAGNOSIS — E119 Type 2 diabetes mellitus without complications: Secondary | ICD-10-CM

## 2017-10-10 DIAGNOSIS — G4733 Obstructive sleep apnea (adult) (pediatric): Secondary | ICD-10-CM | POA: Diagnosis not present

## 2017-10-12 ENCOUNTER — Encounter: Payer: Self-pay | Admitting: Pediatrics

## 2017-10-12 ENCOUNTER — Ambulatory Visit (INDEPENDENT_AMBULATORY_CARE_PROVIDER_SITE_OTHER): Payer: Medicaid Other | Admitting: Pediatrics

## 2017-10-12 VITALS — BP 112/73 | HR 93 | Temp 97.9°F | Resp 22 | Ht 64.0 in | Wt 260.2 lb

## 2017-10-12 DIAGNOSIS — M170 Bilateral primary osteoarthritis of knee: Secondary | ICD-10-CM

## 2017-10-12 DIAGNOSIS — J441 Chronic obstructive pulmonary disease with (acute) exacerbation: Secondary | ICD-10-CM | POA: Diagnosis not present

## 2017-10-12 MED ORDER — PREDNISONE 10 MG PO TABS: 10.0000 mg | ORAL_TABLET | Freq: Every day | ORAL | 0 refills | Status: DC

## 2017-10-12 NOTE — Progress Notes (Signed)
  Subjective:   Patient ID: Kimberly Green, female    DOB: 12-01-1960, 57 y.o.   MRN: [id number] CC: ER Follow up (Bronchitis)  HPI: Kimberly Green is a 57 y.o. female presenting for ER Follow up (Bronchitis)  Was in ED about 1.5 weeks ago at [facility] Given zpak, prednisone, tessalon pearls Symptoms have come back over the past few days Coughing up more phelgm, still feeling SOB Ears feel stopped up Cant breathe walking up stairs No fevers Appetite is ok  Has laryngeal spasms with albuterol, inhalers Taking dulera some days, not all H/o surgery in her neck as a child Has tried 77mo + of PPi in the past with no improvement in symptoms Caused constipation so pt stopped Using albuterol as needed now, nebulizer bothers her the least, does part of a neb at a time  Knees continue to bother her Has fallen multiple times walking down stairs due to knee pain Was tried on cortisone injections, helped for three days, she wants a different opinion  Relevant past medical, surgical, family and social history reviewed. Allergies and medications reviewed and updated. Social History   Tobacco Use  Smoking Status Current Every Day Smoker  . Packs/day: 0.25  . Years: 38.00  . Pack years: 9.50  . Types: Cigarettes  Smokeless Tobacco Never Used  Tobacco Comment   Smokes 5 a day    ROS: Per HPI   Objective:    BP 112/73   Pulse 93   Temp 97.9 F (36.6 C) (Oral)   Resp (!) 22   Ht 5\' 4"  (1.626 m)   Wt 260 lb 3.2 oz (118 kg)   LMP 10/15/2015   SpO2 97%   BMI 44.66 kg/m   Wt Readings from Last 3 Encounters:  10/12/17 260 lb 3.2 oz (118 kg)  08/30/17 262 lb (118.8 kg)  07/19/17 267 lb 9.6 oz (121.4 kg)    Gen: NAD, alert, cooperative with exam, NCAT EYES: EOMI, no conjunctival injection, or no icterus ENT:  TMs dull gray b/l, OP with mild erythema LYMPH: no cervical LAD CV: NRRR, normal S1/S2, no murmur, distal pulses 2+ b/l Resp: moving air fair, wheezes b/l  with exhalation, no crackles, normal WOB Abd: +BS, soft, NTND. no guarding or organomegaly Ext: No edema, warm Neuro: Alert and oriented  Assessment & Plan:  Dessa was seen today for er follow up.  Diagnoses and all orders for this visit:  COPD exacerbation ([place]) Finished zpak last week Worsening wheezing off of prednisone, thinks she had 3 days of prednisone, will do longer steroid taper Encouraged BID dosing of dulera, 2 puffs Smoking cessation -     predniSONE (DELTASONE) 10 MG tablet; Take 1 tablet (10 mg total) by mouth daily with breakfast. Take 4 tabs x 3 days, then 3, 2,  1 tab each for 3 days  Osteoarthritis of both knees, unspecified osteoarthritis type -     Ambulatory referral to Orthopedic Surgery   Follow up plan: As needed Assunta Found, MD [place]

## 2017-10-19 ENCOUNTER — Ambulatory Visit: Payer: Medicaid Other | Admitting: Pediatrics

## 2017-10-20 ENCOUNTER — Other Ambulatory Visit: Payer: Medicaid Other | Admitting: Pediatrics

## 2017-10-30 ENCOUNTER — Telehealth: Payer: Self-pay | Admitting: Pediatrics

## 2017-10-30 NOTE — Telephone Encounter (Signed)
Take albuterol every 4 hours as needed for SOB, is she wheezing? Needs to be seen ASAP

## 2017-10-30 NOTE — Telephone Encounter (Signed)
Pt has chest congestion, drainage and dry cough Denies fever Wants RX  For cough Pt is using humidifier as well as nebulizer and inhalers Pt has tried OTCS with no improvement Pt depends on [facility] for transport Has to give 3 day notice for transportation Please advise

## 2017-10-31 NOTE — Telephone Encounter (Signed)
Pt notified of recommendation Verbalizes understanding appt scheduled for 11/06/2017 due to pt stating she can not come in sooner

## 2017-11-06 ENCOUNTER — Encounter: Payer: Self-pay | Admitting: Pediatrics

## 2017-11-06 ENCOUNTER — Ambulatory Visit (INDEPENDENT_AMBULATORY_CARE_PROVIDER_SITE_OTHER): Payer: Medicaid Other | Admitting: Pediatrics

## 2017-11-06 VITALS — BP 123/79 | HR 101 | Temp 96.0°F | Ht 64.0 in | Wt 263.0 lb

## 2017-11-06 DIAGNOSIS — J441 Chronic obstructive pulmonary disease with (acute) exacerbation: Secondary | ICD-10-CM | POA: Diagnosis not present

## 2017-11-06 DIAGNOSIS — J302 Other seasonal allergic rhinitis: Secondary | ICD-10-CM

## 2017-11-06 MED ORDER — CETIRIZINE HCL 10 MG PO TABS: 10.0000 mg | ORAL_TABLET | Freq: Every day | ORAL | 11 refills | Status: DC

## 2017-11-06 MED ORDER — MOMETASONE FURO-FORMOTEROL FUM 100-5 MCG/ACT IN AERO: 2.0000 | INHALATION_SPRAY | Freq: Two times a day (BID) | RESPIRATORY_TRACT | 5 refills | Status: DC

## 2017-11-06 MED ORDER — PREDNISONE 10 MG PO TABS: 10.0000 mg | ORAL_TABLET | Freq: Every day | ORAL | 0 refills | Status: DC

## 2017-11-06 MED ORDER — METHYLPREDNISOLONE ACETATE 80 MG/ML IJ SUSP
80.0000 mg | Freq: Once | INTRAMUSCULAR | Status: AC
  Administered 2017-11-06: 80 mg via INTRAMUSCULAR

## 2017-11-06 MED ORDER — LEVOFLOXACIN 500 MG PO TABS: 500.0000 mg | ORAL_TABLET | Freq: Every day | ORAL | 0 refills | Status: DC

## 2017-11-06 NOTE — Progress Notes (Signed)
  Subjective:   Patient ID: Kimberly Green, female    DOB: 03/23/1961, 57 y.o.   MRN: [id number] CC: COPD (exacerbation)  HPI: Kimberly Green is a 57 y.o. female presenting for COPD (exacerbation)  Started with cough about 2 weeks ago Lives with 55yo and 3yo grand children, thinks she got a cold from them Now cough bothering her the most Was seen in ED yesterday, prescribed atrovent, albuterol for nebulizer machine Continues to smoke, less past week since being sick Is interested in cessation, is not sure if she is ready now though  Taking dulera most days now  Relevant past medical, surgical, family and social history reviewed. Allergies and medications reviewed and updated. Social History   Tobacco Use  Smoking Status Current Every Day Smoker  . Packs/day: 0.25  . Years: 38.00  . Pack years: 9.50  . Types: Cigarettes  Smokeless Tobacco Never Used  Tobacco Comment   Smokes 5 a day    ROS: Per HPI   Objective:    BP 123/79   Pulse (!) 101   Temp (!) 96 F (35.6 C) (Oral)   Ht 5\' 4"  (1.626 m)   Wt 263 lb (119.3 kg)   LMP 10/15/2015   SpO2 95%   BMI 45.14 kg/m   Wt Readings from Last 3 Encounters:  11/06/17 263 lb (119.3 kg)  10/12/17 260 lb 3.2 oz (118 kg)  08/30/17 262 lb (118.8 kg)    Gen: NAD, alert, cooperative with exam, NCAT EYES: EOMI, no conjunctival injection, or no icterus ENT:  TMs pearly gray b/l, OP without erythema LYMPH: no cervical LAD CV: slighlty tachycardic, RR, normal S1/S2, no murmur, distal pulses 2+ b/l Resp: moving air fair, prolonged expiratory phase with exp wheezes throughout, comfortable WOB, coughing frequently Abd: +BS, soft, NTND. no guarding or organomegaly Ext: No edema, warm Neuro: Alert and oriented MSK: normal muscle bulk  Assessment & Plan:  Kimberly Green was seen today for copd.  Diagnoses and all orders for this visit:  COPD exacerbation ([place]) -     predniSONE (DELTASONE) 10 MG tablet; Take 1 tablet (10 mg  total) by mouth daily with breakfast. Take 4 tabs x 3 days, then 3, 2,  1 tab each for 3 days -     levofloxacin (LEVAQUIN) 500 MG tablet; Take 1 tablet (500 mg total) by mouth daily. -     mometasone-formoterol (DULERA) 100-5 MCG/ACT AERO; Inhale 2 puffs into the lungs 2 (two) times daily. -     methylPREDNISolone acetate (DEPO-MEDROL) injection 80 mg  Seasonal allergic rhinitis -     cetirizine (ZYRTEC) 10 MG tablet; Take 1 tablet (10 mg total) by mouth daily.   Follow up plan: Return if symptoms worsen or fail to improve. Assunta Found, MD [place]

## 2017-11-06 NOTE — Patient Instructions (Signed)
Atrovent/albuterol four times a day while awake Getting steroid shot today Start prednisone tomorrow  Start antibiotic today  Take dulera two puffs twice a day

## 2017-11-07 ENCOUNTER — Other Ambulatory Visit: Payer: Self-pay | Admitting: Pediatrics

## 2017-11-07 DIAGNOSIS — Z0289 Encounter for other administrative examinations: Secondary | ICD-10-CM

## 2017-11-07 DIAGNOSIS — E119 Type 2 diabetes mellitus without complications: Secondary | ICD-10-CM

## 2017-11-09 ENCOUNTER — Other Ambulatory Visit: Payer: Medicaid Other | Admitting: Pediatrics

## 2017-11-09 DIAGNOSIS — M170 Bilateral primary osteoarthritis of knee: Secondary | ICD-10-CM | POA: Diagnosis not present

## 2017-11-09 DIAGNOSIS — M25561 Pain in right knee: Secondary | ICD-10-CM | POA: Diagnosis not present

## 2017-11-09 DIAGNOSIS — M25562 Pain in left knee: Secondary | ICD-10-CM | POA: Diagnosis not present

## 2017-11-10 DIAGNOSIS — G4733 Obstructive sleep apnea (adult) (pediatric): Secondary | ICD-10-CM | POA: Diagnosis not present

## 2017-11-30 ENCOUNTER — Other Ambulatory Visit: Payer: Self-pay | Admitting: Pediatrics

## 2017-12-07 ENCOUNTER — Telehealth: Payer: Self-pay | Admitting: Neurology

## 2017-12-07 DIAGNOSIS — G4733 Obstructive sleep apnea (adult) (pediatric): Secondary | ICD-10-CM

## 2017-12-07 DIAGNOSIS — Z9989 Dependence on other enabling machines and devices: Principal | ICD-10-CM

## 2017-12-07 NOTE — Telephone Encounter (Signed)
Pt has received a letter from [facility] re: her CPAP and she is asking for a call to discuss

## 2017-12-07 NOTE — Telephone Encounter (Signed)
I called pt. She has resolved this issue with her DME and does not need anything from [place].

## 2017-12-07 NOTE — Telephone Encounter (Signed)
I called pt to discuss. Someone picks up the phone but does not say anything.  I tried calling again and no answer, no VM set up.  If pt calls back, please ask her specifically what questions she has about this letter. She may need to contact her DME regarding her insurance and her cpap.

## 2017-12-08 ENCOUNTER — Other Ambulatory Visit: Payer: Self-pay | Admitting: Pediatrics

## 2017-12-08 DIAGNOSIS — E119 Type 2 diabetes mellitus without complications: Secondary | ICD-10-CM

## 2017-12-08 DIAGNOSIS — G4733 Obstructive sleep apnea (adult) (pediatric): Secondary | ICD-10-CM | POA: Diagnosis not present

## 2017-12-22 ENCOUNTER — Telehealth: Payer: Self-pay | Admitting: Pediatrics

## 2017-12-22 NOTE — Telephone Encounter (Signed)
Rx placed on provider's desk for signature 

## 2017-12-28 ENCOUNTER — Other Ambulatory Visit: Payer: Self-pay | Admitting: Pediatrics

## 2017-12-28 DIAGNOSIS — F32A Depression, unspecified: Secondary | ICD-10-CM

## 2017-12-28 DIAGNOSIS — F329 Major depressive disorder, single episode, unspecified: Secondary | ICD-10-CM

## 2017-12-28 DIAGNOSIS — E119 Type 2 diabetes mellitus without complications: Secondary | ICD-10-CM

## 2017-12-28 NOTE — Telephone Encounter (Signed)
Last seen 11/06/17 Dr Evette Doffing

## 2018-01-08 DIAGNOSIS — G4733 Obstructive sleep apnea (adult) (pediatric): Secondary | ICD-10-CM | POA: Diagnosis not present

## 2018-01-11 ENCOUNTER — Telehealth: Payer: Self-pay | Admitting: Pediatrics

## 2018-01-11 NOTE — Addendum Note (Signed)
Addended by: Lester Sumpter A on: 01/11/2018 05:07 PM   Modules accepted: Orders

## 2018-01-11 NOTE — Telephone Encounter (Signed)
Pt called stating she has recently got her teeth pulled which has made it uncomfortable to use her cpap machine, requesting she switch to a full mask. Please call to discuss

## 2018-01-11 NOTE — Telephone Encounter (Signed)
Gave pt [facility] number who has handled her sleep study and all since she will need a new machine and everything

## 2018-01-11 NOTE — Telephone Encounter (Signed)
I called pt, she has gotten all of her teeth pulled, and is asking for FFM. Pt reports that [facility] told her that they need an order from [place] to switch her mask. Will send order to [facility].

## 2018-01-18 DIAGNOSIS — R0603 Acute respiratory distress: Secondary | ICD-10-CM | POA: Diagnosis not present

## 2018-01-30 ENCOUNTER — Telehealth: Payer: Self-pay | Admitting: Pediatrics

## 2018-01-30 NOTE — Telephone Encounter (Signed)
Pt would like to know if any of her current medications will interfere with nicotine patches and nicotine gum Instructed pt to contact her pharmacy Pt verbalizes understanding

## 2018-02-07 DIAGNOSIS — G4733 Obstructive sleep apnea (adult) (pediatric): Secondary | ICD-10-CM | POA: Diagnosis not present

## 2018-02-27 ENCOUNTER — Ambulatory Visit: Payer: Medicaid Other | Admitting: Adult Health

## 2018-03-02 ENCOUNTER — Encounter: Payer: Self-pay | Admitting: Pediatrics

## 2018-03-02 ENCOUNTER — Ambulatory Visit (INDEPENDENT_AMBULATORY_CARE_PROVIDER_SITE_OTHER): Payer: Medicaid Other | Admitting: Pediatrics

## 2018-03-02 VITALS — BP 124/83 | HR 92 | Temp 98.5°F | Resp 24 | Ht 64.0 in | Wt 257.0 lb

## 2018-03-02 DIAGNOSIS — R05 Cough: Secondary | ICD-10-CM | POA: Diagnosis not present

## 2018-03-02 DIAGNOSIS — R1013 Epigastric pain: Secondary | ICD-10-CM

## 2018-03-02 DIAGNOSIS — G4733 Obstructive sleep apnea (adult) (pediatric): Secondary | ICD-10-CM

## 2018-03-02 DIAGNOSIS — R1906 Epigastric swelling, mass or lump: Secondary | ICD-10-CM

## 2018-03-02 DIAGNOSIS — E119 Type 2 diabetes mellitus without complications: Secondary | ICD-10-CM | POA: Diagnosis not present

## 2018-03-02 DIAGNOSIS — R053 Chronic cough: Secondary | ICD-10-CM

## 2018-03-02 DIAGNOSIS — J449 Chronic obstructive pulmonary disease, unspecified: Secondary | ICD-10-CM | POA: Diagnosis not present

## 2018-03-02 LAB — BAYER DCA HB A1C WAIVED: HB A1C: 6.7 % (ref <7.0)

## 2018-03-02 MED ORDER — GABAPENTIN 100 MG PO CAPS: 100.0000 mg | ORAL_CAPSULE | Freq: Three times a day (TID) | ORAL | 1 refills | Status: DC

## 2018-03-02 NOTE — Progress Notes (Signed)
Subjective:   Patient ID: Kimberly Green, female    DOB: 02-Dec-1960, 57 y.o.   MRN: [id number] CC: Medical Management of Chronic Issues and Cough  HPI: Kimberly Green is a 57 y.o. female   OSA: has bipap at home.  Problems using it regularly since she had her teeth removed, is getting fit for dentures, currently they do not fit.  Tried using the facemask, could not tolerate.  Cough: Continues to have spasms of coughing.  For the last week is been better.  Using Q-tips, food or drink touching the wrong place in the back of her throat can set off the coughing attack. Asks about Arnold's nerve cough reflex.  She continues to smoke, has called the 1 800 quit now number.  Has patches and lozenges through them, is trying to actively cut back.    She is moving out of her current home situation which she thinks will be a positive change.  Has an appointment in 2 weeks to follow-up with her psychiatrist.  She says her mood is been stable, thoughts of not wanting to be here are no different than what they have been for years.   Abdominal mass: After coughing one night she felt something tear, has had a small lump in her upper stomach area since then, sometimes sore and tender.  Relevant past medical, surgical, family and social history reviewed. Allergies and medications reviewed and updated. Social History   Tobacco Use  Smoking Status Current Every Day Smoker  . Packs/day: 0.25  . Years: 38.00  . Pack years: 9.50  . Types: Cigarettes  Smokeless Tobacco Never Used  Tobacco Comment   Smokes 5 a day    ROS: Per HPI   Objective:    BP 124/83   Pulse 92   Temp 98.5 F (36.9 C) (Oral)   Resp (!) 24   Ht 5\' 4"  (1.626 m)   Wt 257 lb (116.6 kg)   LMP 10/15/2015   SpO2 95%   BMI 44.11 kg/m   Wt Readings from Last 3 Encounters:  03/02/18 257 lb (116.6 kg)  11/06/17 263 lb (119.3 kg)  10/12/17 260 lb 3.2 oz (118 kg)    Gen: NAD, alert, cooperative with exam, NCAT EYES:  EOMI, no conjunctival injection, or no icterus ENT:  TMs pearly gray b/l, OP without erythema LYMPH: no cervical LAD CV: NRRR, normal S1/S2, no murmur, distal pulses 2+ b/l Resp: CTABL, no wheezes, normal WOB Abd: +BS, soft, approximately 5 cm soft mass present in epigastric area . no guarding Ext: No edema, warm Neuro: Alert and oriented MSK: normal muscle bulk  Assessment & Plan:  Yarely was seen today for medical management of chronic issues and cough.  Diagnoses and all orders for this visit:  Diabetes mellitus without complication (HCC) Y7C 6.7.  Unchanged from last check.  Continue current medicines, avoiding sugary beverages. -     Bayer DCA Hb A1c Waived -     Microalbumin / creatinine urine ratio  OSA (obstructive sleep apnea) Ongoing symptoms, patient is hopeful will improve once she is able to use the nasal pillow again with dentures that fit.  Has an appointment next week with the dentist.  Epigastric pain Suspect hernia, will evaluate with below. -     CT ABDOMEN W CONTRAST; Future  Epigastric mass  Tobacco use Smoking cessation strategies discussed.  Continue patches, lozenges as needed.  Has had good support with the 1 800 quit now number, has called them  several times.  Chronic obstructive pulmonary disease, unspecified COPD type ([place]) -     Ambulatory referral to Pulmonology  Chronic cough Patient very frustrated with ongoing symptoms, has been pleased with decreased episodes for last week, though they are still present, has been bothered by symptoms for many months.  Asks about trying gabapentin, reasonable at a low dose. Start once a day. Continue to work on smoking cessation and better COPD control. -     gabapentin (NEURONTIN) 100 MG capsule; Take 1 capsule (100 mg total) by mouth 3 (three) times daily.   Follow up plan: Return in about 3 months (around 06/02/2018). Assunta Found, MD [place]

## 2018-03-03 LAB — MICROALBUMIN / CREATININE URINE RATIO
CREATININE, UR: 84.9 mg/dL
MICROALB/CREAT RATIO: 9.9 mg/g{creat} (ref 0.0–30.0)
MICROALBUM., U, RANDOM: 8.4 ug/mL

## 2018-03-08 ENCOUNTER — Other Ambulatory Visit: Payer: Self-pay | Admitting: Pediatrics

## 2018-03-08 DIAGNOSIS — E119 Type 2 diabetes mellitus without complications: Secondary | ICD-10-CM

## 2018-03-10 DIAGNOSIS — G4733 Obstructive sleep apnea (adult) (pediatric): Secondary | ICD-10-CM | POA: Diagnosis not present

## 2018-03-12 ENCOUNTER — Other Ambulatory Visit: Payer: Self-pay | Admitting: Pediatrics

## 2018-03-12 DIAGNOSIS — F329 Major depressive disorder, single episode, unspecified: Secondary | ICD-10-CM

## 2018-03-12 DIAGNOSIS — F32A Depression, unspecified: Secondary | ICD-10-CM

## 2018-03-13 MED ORDER — QUETIAPINE FUMARATE 100 MG PO TABS: 200.0000 mg | ORAL_TABLET | Freq: Every day | ORAL | 2 refills | Status: DC

## 2018-03-15 ENCOUNTER — Encounter: Payer: Self-pay | Admitting: Internal Medicine

## 2018-03-15 ENCOUNTER — Ambulatory Visit: Payer: Medicaid Other | Admitting: Internal Medicine

## 2018-03-15 VITALS — BP 126/70 | HR 89 | Ht 64.0 in | Wt 259.0 lb

## 2018-03-15 DIAGNOSIS — F17210 Nicotine dependence, cigarettes, uncomplicated: Secondary | ICD-10-CM | POA: Diagnosis not present

## 2018-03-15 DIAGNOSIS — R05 Cough: Secondary | ICD-10-CM | POA: Diagnosis not present

## 2018-03-15 DIAGNOSIS — R0609 Other forms of dyspnea: Secondary | ICD-10-CM | POA: Diagnosis not present

## 2018-03-15 DIAGNOSIS — R058 Other specified cough: Secondary | ICD-10-CM

## 2018-03-15 MED ORDER — GABAPENTIN 300 MG PO CAPS: 300.0000 mg | ORAL_CAPSULE | Freq: Three times a day (TID) | ORAL | 2 refills | Status: DC

## 2018-03-15 NOTE — Patient Instructions (Signed)
Increase gabapentin to 200 mg three times a day for a week then 300 mg three times a day   The key is to stop smoking completely before smoking completely stops you - it's not too late  Please schedule a follow up office visit in 6 weeks, call sooner if needed with all medications /inhalers/ solutions in hand so we can verify exactly what you are taking. This includes all medications from all doctors and over the counters

## 2018-03-15 NOTE — Progress Notes (Signed)
Subjective:     Patient ID: Kimberly Green, female   DOB: 30-Mar-1961,     MRN: [id number]  HPI  30 yowf active smoker as long as she can remember having attacks called laryngospasms resolves with or without treatment abruptly but starting gaining wt from baseline 140-150 then 2011 more steadily downhill with doe x can't ever do grocery shopping anymore  assoc  With intermittent cough since about same period of time so referred to pulmonary clinic 03/15/2018 by Dr   Evette Doffing    03/15/2018 1st [facility] office visit/ Kimberly Green   Chief Complaint  Patient presents with  . Pulmonary Consult    Referred by Dr. Evette Doffing.  Pt c/o cough off and on since 2011. She states she was dxed with COPD. She has been coughing consistantly for the past 6 wks "I have a cold"- prod with green sputum.  She states cough sometimes wakes her up in the night. She rarely uses her albuterol inhaler or neb.    maint off of all inhaled resp rx due to severity of laryngospasm last used albuterol April 2019  Sleeps in recliner R side and 45 degrees otherwise smothers/ chokes Green mucus in am "they are calling me in levaquin" Gabapentin started  Mar 02 2018 and helping Off lisinopril since 12/2015    No obvious day to day or daytime variability or assoc   mucus plugs or hemoptysis or cp or chest tightness, subjective wheeze or overt sinus or hb symptoms. No unusual exposure hx or h/o childhood pna/ asthma or knowledge of premature birth.  Sleeping  Poorly but at 45 degrees does ok   without nocturnal  or early am exacerbation  of respiratory  c/o's or need for noct saba. Also denies any obvious fluctuation of symptoms with weather or environmental changes or other aggravating or alleviating factors except as outlined above   Current Allergies, Complete Past Medical History, Past Surgical History, Family History, and Social History were reviewed in [employer] record.  ROS  The following are not  active complaints unless bolded Hoarseness, sore throat, dysphagia, dental problems, itching, sneezing,  nasal congestion or discharge of excess mucus or purulent secretions, ear ache,   fever, chills, sweats, unintended wt loss or wt gain, classically pleuritic or exertional cp,  orthopnea pnd or arm/hand swelling  or leg swelling, presyncope, palpitations, abdominal pain, anorexia, nausea, vomiting, diarrhea  or change in bowel habits or change in bladder habits, change in stools or change in urine, dysuria, hematuria,  rash, arthralgias, visual complaints, headache, numbness, weakness or ataxia or problems with walking or coordination,  change in mood or  memory.        Current Meds  Medication Sig  . albuterol (PROVENTIL HFA;VENTOLIN HFA) 108 (90 Base) MCG/ACT inhaler Inhale 1-2 puffs into the lungs every 6 (six) hours as needed for wheezing or shortness of breath.  Marland Kitchen albuterol (PROVENTIL) (2.5 MG/3ML) 0.083% nebulizer solution Take 3 mLs (2.5 mg total) by nebulization every 6 (six) hours as needed.  Marland Kitchen amphetamine-dextroamphetamine (ADDERALL) 10 MG tablet TAKE 1 TABLET BY MOUTH EVERY MORNING AND TAKE 1 TABLET BY MOUTH EVERY DAY AT NOON - MAY FILL IN JULY  . atorvastatin (LIPITOR) 20 MG tablet TAKE ONE TABLET BY MOUTH EVERY DAY  . Blood Glucose Monitoring Suppl (ACCU-CHEK AVIVA PLUS) w/Device KIT Use to check BG bid  . FARXIGA 5 MG TABS tablet TAKE ONE TABLET BY MOUTH TWICE DAILY  . gabapentin (NEURONTIN) 100 MG capsule  Take 1 capsule (100 mg total) by mouth 3 (three) times daily.  Marland Kitchen glucose blood (ACCU-CHEK AVIVA PLUS) test strip Use to check BG twice daily  . ibuprofen (ADVIL,MOTRIN) 200 MG tablet Take 200 mg by mouth every 6 (six) hours as needed.  . Lancets (ACCU-CHEK SOFT TOUCH) lancets Use to check BG twice a day  . metFORMIN (GLUCOPHAGE) 1000 MG tablet TAKE ONE TABLET BY MOUTH TWICE DAILY  . QUEtiapine (SEROQUEL) 100 MG tablet Take 2 tablets (200 mg total) by mouth at bedtime.                Review of Systems     Objective:   Physical Exam Hoarse obese wf nad   Wt Readings from Last 3 Encounters:  03/15/18 259 lb (117.5 kg)  03/02/18 257 lb (116.6 kg)  11/06/17 263 lb (119.3 kg)     Vital signs reviewed - Note on arrival 02 sats  96% on RA      HEENT: nl turbinates bilaterally, and oropharynx. Nl external ear canals with cough reflex R > L ear / only two lower teeth remaining    NECK :  without JVD/Nodes/TM/ nl carotid upstrokes bilaterally   LUNGS: no acc muscle use,  Nl contour chest with prominent pseudoweeze/ transmitted upper airway sounds without cough on insp or exp maneuvers   CV:  RRR  no s3 or murmur or increase in P2, and no edema   ABD:  Obese/ soft and nontender with nl inspiratory excursion in the supine position. No bruits or organomegaly appreciated, bowel sounds nl  MS:  Nl gait/ ext warm without deformities, calf tenderness, cyanosis or clubbing No obvious joint restrictions   SKIN: warm and dry without lesions    NEURO:  alert, approp, nl sensorium with  no motor or cerebellar deficits apparent.    I personally reviewed images and agree with radiology impression as follows:  CXR:   01/02/18 wnl         Assessment:

## 2018-03-16 ENCOUNTER — Encounter: Payer: Self-pay | Admitting: Internal Medicine

## 2018-03-16 DIAGNOSIS — R05 Cough: Secondary | ICD-10-CM | POA: Insufficient documentation

## 2018-03-16 DIAGNOSIS — F17210 Nicotine dependence, cigarettes, uncomplicated: Secondary | ICD-10-CM | POA: Insufficient documentation

## 2018-03-16 DIAGNOSIS — R058 Other specified cough: Secondary | ICD-10-CM | POA: Insufficient documentation

## 2018-03-16 NOTE — Assessment & Plan Note (Signed)
03/15/2018  Walked RA x 3 laps @ 185 ft each stopped due to  End of study, fast pace,sob at end but no  desat    -  Spirometry 03/15/2018  FEV1 1.41 (52%)  Ratio 83 but only blew out 4.5 sec / min curvature    Upper airway cough syndrome (previously labeled PNDS),  is so named because it's frequently impossible to sort out how much is  CR/sinusitis with freq throat clearing (which can be related to primary GERD)   vs  causing  secondary (" extra esophageal")  GERD from wide swings in gastric pressure that occur with throat clearing, often  promoting self use of mint and menthol lozenges that reduce the lower esophageal sphincter tone and exacerbate the problem further in a cyclical fashion.   These are the same pts (now being labeled as having "irritable larynx syndrome" by some cough centers) who not infrequently have a history of having failed to tolerate ace inhibitors,  dry powder inhalers or biphosphonates or report having atypical/extraesophageal reflux symptoms that don't respond to standard doses of PPI  and are easily confused as having aecopd or asthma flares by even experienced allergists/ pulmonologists (myself included).   rec max gabapentin trial and then consider adding  1st gen H1 blockers per guidelines which has anticholinergic properties and may help block the vagal signal from ear canal manipulation.     Advised pt:  The standardized cough guidelines published in Chest by Lissa Morales in 2006 are still the best available and consist of a multiple step process (up to 12!) , not a single office visit,  and are intended  to address this problem logically,  with an alogrithm dependent on response to empiric treatment at  each progressive step  to determine a specific diagnosis with  minimal addtional testing needed. Therefore if adherence is an issue or can't be accurately verified,  it's very unlikely the standard evaluation and treatment will be successful here.    Furthermore, response to  therapy (other than acute cough suppression, which should only be used short term with avoidance of narcotic containing cough syrups if possible), can be a gradual process for which the patient is not likely to  perceive immediate benefit.  Unlike going to an eye doctor where the best perscription is almost always the first one and is immediately effective, this is almost never the case in the management of chronic cough syndromes. Therefore the patient needs to commit up front to consistently adhere to recommendations  for up to 6 weeks of therapy directed at the likely underlying problem(s) before the response can be reasonably evaluated.    F/u in 6weeks   Total time devoted to counseling  > 50 % of initial 60 min office visit:  review case with pt/ discussion of options/alternatives/ personally creating written customized instructions  in presence of pt  then going over those specific  Instructions directly with the pt including how to use all of the meds but in particular covering each new medication in detail and the difference between the maintenance= "automatic" meds and the prns using an action plan format for the latter (If this problem/symptom => do that organization reading Left to right).  Please see AVS from this visit for a full list of these instructions which I personally wrote for this pt and  are unique to this visit.

## 2018-03-16 NOTE — Assessment & Plan Note (Signed)
4-5 min discussion re active cigarette smoking in addition to office E&M  Ask about tobacco use:  active Advise quitting    I reviewed the Fletcher curve with the patient that basically indicates  if you quit smoking when your best day FEV1 is still   preserved (as is probably still   the case here)  it is highly unlikely you will progress to severe disease and informed the patient there was  no medication on the market that has proven to alter the curve/ its downward trajectory  or the likelihood of progression of their disease(unlike other chronic medical conditions such as atheroclerosis where we do think we can change the natural hx with risk reducing meds)    Therefore stopping smoking and maintaining abstinence are  the most important aspects of care, not choice of inhalers or for that matter, doctors.  Treatment other than smoking cessation  is entirely directed by severity of symptoms and focused also on reducing exacerbations, not attempting to change the natural history of the disease.   Assess willingness working on it Assist in quit attempt per pcp Arrange follow up. Per pcp

## 2018-03-16 NOTE — Assessment & Plan Note (Addendum)
03/15/2018  Walked RA x 3 laps @ 185 ft each stopped due to  End of study, fast pace,sob at end but no  desat    -  Spirometry 03/15/2018  FEV1 1.41 (52%)  Ratio 83 but only blew out 4.5 sec / min curvature     Symptoms are markedly disproportionate to objective findings and not clear to what extent this is actually a pulmonary  problem but pt does appear to have difficult to sort out respiratory symptoms of unknown origin for which  DDX  = almost all start with A and  include Adherence, Ace Inhibitors, Acid Reflux, Active Sinus Disease, Alpha 1 Antitripsin deficiency, Anxiety masquerading as Airways dz,  ABPA,  Allergy(esp in young), Aspiration (esp in elderly), Adverse effects of meds,  Active smokers, A bunch of PE's/clot burden (a few small clots can't cause this syndrome unless there is already severe underlying pulm or vascular dz with poor reserve),  Anemia or thyroid disorder, plus two Bs  = Bronchiectasis and Beta blocker use..and one C= CHF     Adherence is always the initial "prime suspect" and is a multilayered concern that requires a "trust but verify" approach in every patient - starting with knowing how to use medications, especially inhalers, correctly, keeping up with refills and understanding the fundamental difference between maintenance and prns vs those medications only taken for a very short course and then stopped and not refilled.  - return with all meds in hand using a trust but verify approach to confirm accurate Medication  Reconciliation The principal here is that until we are certain that the  patients are doing what we've asked, it makes no sense to ask them to do more.   Active  Smoking > see sep a/p  ? gerd > says rx didn't help her "throat spasms in past  - see uacs a/p  ? Allergy/asthma > says all inhaled agents make her problems worse and none indicated for now   ? Anxiety/VCD  > usually at the bottom of this list of usual suspects but should be much higher on this  pt's based on H and P and note already on psychotropics and may interfere with adherence and also interpretation of response or lack thereof to symptom management which can be quite subjective.   ? Active sinus dz> may need w/u for this next but medicaid has restrictions on use of sinus ct so ent eval would be needed first

## 2018-03-19 ENCOUNTER — Telehealth: Payer: Self-pay | Admitting: Pediatrics

## 2018-03-19 NOTE — Telephone Encounter (Signed)
Patient aware CT Abd was denied by insurance and she has to wait 90 days from the last one done in March. Requesting to speak to someone in referrals to get a little more information about the denial.

## 2018-04-18 DIAGNOSIS — Z5181 Encounter for therapeutic drug level monitoring: Secondary | ICD-10-CM | POA: Diagnosis not present

## 2018-04-18 DIAGNOSIS — F319 Bipolar disorder, unspecified: Secondary | ICD-10-CM | POA: Diagnosis not present

## 2018-04-18 DIAGNOSIS — Z7689 Persons encountering health services in other specified circumstances: Secondary | ICD-10-CM | POA: Diagnosis not present

## 2018-04-19 ENCOUNTER — Encounter: Payer: Self-pay | Admitting: Internal Medicine

## 2018-04-26 ENCOUNTER — Other Ambulatory Visit: Payer: Self-pay | Admitting: Pediatrics

## 2018-04-26 ENCOUNTER — Ambulatory Visit: Payer: Medicaid Other | Admitting: Pediatrics

## 2018-04-26 DIAGNOSIS — F319 Bipolar disorder, unspecified: Secondary | ICD-10-CM | POA: Diagnosis not present

## 2018-04-27 ENCOUNTER — Ambulatory Visit: Payer: Medicaid Other | Admitting: Internal Medicine

## 2018-04-27 DIAGNOSIS — F319 Bipolar disorder, unspecified: Secondary | ICD-10-CM | POA: Diagnosis not present

## 2018-04-29 ENCOUNTER — Other Ambulatory Visit: Payer: Self-pay | Admitting: Cardiovascular Disease

## 2018-04-30 DIAGNOSIS — Z7689 Persons encountering health services in other specified circumstances: Secondary | ICD-10-CM | POA: Diagnosis not present

## 2018-05-30 ENCOUNTER — Other Ambulatory Visit: Payer: Self-pay | Admitting: Cardiovascular Disease

## 2018-05-30 ENCOUNTER — Other Ambulatory Visit: Payer: Self-pay | Admitting: Internal Medicine

## 2018-05-30 ENCOUNTER — Other Ambulatory Visit: Payer: Self-pay | Admitting: Pediatrics

## 2018-05-30 DIAGNOSIS — E119 Type 2 diabetes mellitus without complications: Secondary | ICD-10-CM

## 2018-05-31 NOTE — Telephone Encounter (Signed)
OV 06/05/18

## 2018-06-05 ENCOUNTER — Ambulatory Visit: Payer: Medicaid Other | Admitting: Pediatrics

## 2018-06-27 ENCOUNTER — Other Ambulatory Visit: Payer: Self-pay | Admitting: Pediatrics

## 2018-06-27 DIAGNOSIS — F32A Depression, unspecified: Secondary | ICD-10-CM

## 2018-06-27 DIAGNOSIS — F329 Major depressive disorder, single episode, unspecified: Secondary | ICD-10-CM

## 2018-06-27 NOTE — Telephone Encounter (Signed)
Last seen 03/02/18

## 2018-06-30 ENCOUNTER — Other Ambulatory Visit: Payer: Self-pay | Admitting: Pediatrics

## 2018-06-30 DIAGNOSIS — E119 Type 2 diabetes mellitus without complications: Secondary | ICD-10-CM

## 2018-07-24 DIAGNOSIS — Z7689 Persons encountering health services in other specified circumstances: Secondary | ICD-10-CM | POA: Diagnosis not present

## 2018-07-24 DIAGNOSIS — Z5181 Encounter for therapeutic drug level monitoring: Secondary | ICD-10-CM | POA: Diagnosis not present

## 2018-07-27 ENCOUNTER — Other Ambulatory Visit: Payer: Self-pay | Admitting: Pediatrics

## 2018-07-27 DIAGNOSIS — F32A Depression, unspecified: Secondary | ICD-10-CM

## 2018-07-27 DIAGNOSIS — F329 Major depressive disorder, single episode, unspecified: Secondary | ICD-10-CM

## 2018-07-27 NOTE — Telephone Encounter (Signed)
Last seen 03/02/18

## 2018-07-31 ENCOUNTER — Other Ambulatory Visit: Payer: Self-pay | Admitting: Pediatrics

## 2018-07-31 DIAGNOSIS — E119 Type 2 diabetes mellitus without complications: Secondary | ICD-10-CM

## 2018-08-01 NOTE — Telephone Encounter (Signed)
OV 08/17/18 

## 2018-08-17 ENCOUNTER — Ambulatory Visit: Payer: Medicaid Other | Admitting: Pediatrics

## 2018-08-17 ENCOUNTER — Encounter: Payer: Self-pay | Admitting: Pediatrics

## 2018-08-17 ENCOUNTER — Ambulatory Visit (INDEPENDENT_AMBULATORY_CARE_PROVIDER_SITE_OTHER): Payer: Medicaid Other

## 2018-08-17 VITALS — BP 123/79 | HR 95 | Temp 97.6°F | Resp 22 | Ht 64.0 in | Wt 258.4 lb

## 2018-08-17 DIAGNOSIS — R1013 Epigastric pain: Secondary | ICD-10-CM | POA: Diagnosis not present

## 2018-08-17 DIAGNOSIS — E785 Hyperlipidemia, unspecified: Secondary | ICD-10-CM | POA: Diagnosis not present

## 2018-08-17 DIAGNOSIS — Z1211 Encounter for screening for malignant neoplasm of colon: Secondary | ICD-10-CM

## 2018-08-17 DIAGNOSIS — M545 Low back pain, unspecified: Secondary | ICD-10-CM

## 2018-08-17 DIAGNOSIS — Z7689 Persons encountering health services in other specified circumstances: Secondary | ICD-10-CM | POA: Diagnosis not present

## 2018-08-17 DIAGNOSIS — E119 Type 2 diabetes mellitus without complications: Secondary | ICD-10-CM | POA: Diagnosis not present

## 2018-08-17 DIAGNOSIS — F329 Major depressive disorder, single episode, unspecified: Secondary | ICD-10-CM | POA: Diagnosis not present

## 2018-08-17 DIAGNOSIS — Z23 Encounter for immunization: Secondary | ICD-10-CM | POA: Diagnosis not present

## 2018-08-17 DIAGNOSIS — S3992XA Unspecified injury of lower back, initial encounter: Secondary | ICD-10-CM | POA: Diagnosis not present

## 2018-08-17 DIAGNOSIS — I10 Essential (primary) hypertension: Secondary | ICD-10-CM | POA: Diagnosis not present

## 2018-08-17 DIAGNOSIS — R05 Cough: Secondary | ICD-10-CM

## 2018-08-17 DIAGNOSIS — M65332 Trigger finger, left middle finger: Secondary | ICD-10-CM

## 2018-08-17 DIAGNOSIS — M79641 Pain in right hand: Secondary | ICD-10-CM | POA: Diagnosis not present

## 2018-08-17 DIAGNOSIS — F319 Bipolar disorder, unspecified: Secondary | ICD-10-CM

## 2018-08-17 DIAGNOSIS — F32A Depression, unspecified: Secondary | ICD-10-CM

## 2018-08-17 DIAGNOSIS — J449 Chronic obstructive pulmonary disease, unspecified: Secondary | ICD-10-CM | POA: Diagnosis not present

## 2018-08-17 DIAGNOSIS — M19041 Primary osteoarthritis, right hand: Secondary | ICD-10-CM | POA: Diagnosis not present

## 2018-08-17 DIAGNOSIS — R059 Cough, unspecified: Secondary | ICD-10-CM

## 2018-08-17 LAB — BAYER DCA HB A1C WAIVED: HB A1C (BAYER DCA - WAIVED): 7.1 % — ABNORMAL HIGH (ref <7.0)

## 2018-08-17 MED ORDER — ATORVASTATIN CALCIUM 20 MG PO TABS: ORAL_TABLET | ORAL | 6 refills | Status: DC

## 2018-08-17 MED ORDER — DAPAGLIFLOZIN PROPANEDIOL 10 MG PO TABS: 10.0000 mg | ORAL_TABLET | Freq: Every day | ORAL | 5 refills | Status: DC

## 2018-08-17 MED ORDER — FLUTICASONE PROPIONATE HFA 110 MCG/ACT IN AERO: 2.0000 | INHALATION_SPRAY | Freq: Two times a day (BID) | RESPIRATORY_TRACT | 12 refills | Status: DC

## 2018-08-17 MED ORDER — METFORMIN HCL 1000 MG PO TABS: 1000.0000 mg | ORAL_TABLET | Freq: Two times a day (BID) | ORAL | 2 refills | Status: DC

## 2018-08-17 MED ORDER — QUETIAPINE FUMARATE 100 MG PO TABS: 200.0000 mg | ORAL_TABLET | Freq: Every day | ORAL | 2 refills | Status: DC

## 2018-08-17 MED ORDER — GABAPENTIN 100 MG PO CAPS: 100.0000 mg | ORAL_CAPSULE | Freq: Two times a day (BID) | ORAL | 5 refills | Status: DC

## 2018-08-17 MED ORDER — ALBUTEROL SULFATE (2.5 MG/3ML) 0.083% IN NEBU: 2.5000 mg | INHALATION_SOLUTION | Freq: Four times a day (QID) | RESPIRATORY_TRACT | 2 refills | Status: DC | PRN

## 2018-08-17 MED ORDER — ALBUTEROL SULFATE HFA 108 (90 BASE) MCG/ACT IN AERS: 1.0000 | INHALATION_SPRAY | Freq: Four times a day (QID) | RESPIRATORY_TRACT | 0 refills | Status: DC | PRN

## 2018-08-17 NOTE — Patient Instructions (Signed)
   [facility] in [place]  [phone] [address] [place], [place] for Autism but does not treat it Sees Children / Accepts Medicaid  [facility]    [phone] [address], [address]   [place], [place] Medication management, substance abuse, bipolar, grief, family, marriage, OCD, anxiety, PTSD Sees children / Accepts Medicaid

## 2018-08-17 NOTE — Progress Notes (Signed)
Subjective:   Patient ID: Kimberly Green, female    DOB: August 02, 1961, 57 y.o.   MRN: [id number] CC: Medical Management of Chronic Issues  HPI: Kimberly Green is a 57 y.o. female   Cough: Ongoing intermittently.  She does think the albuterol helps.  She is been taking her granddaughters Flovent.  This also seems to help.  The gabapentin 100 mg twice a day helps some as well.  She is working on losing weight.  Diabetes: Taking medicines regularly.  Will be moving continue living situation, has not yet moved from last visit.  Bipolar disorder: Taking Seroquel nightly.  Mood has been stable.  Wants to establish care with new psychiatrist, was following with [facility] but wants to switch to alternate.  Hyperlipidemia: Taking atorvastatin nightly.  No side effects.  Hand pain: Broke her right hand about 2 years ago.  Got better initially, now having more pain right side of hand, 1/5 metatarsal, pain radiates down into her wrist.  Left middle finger trigger finger: Was given steroid injections in the past.  Has not helped improve trigger finger.  Affecting her ability to care for her grandchildren.  Would like fixed if possible.  Colon cancer screening: Due  Tobacco use: Still talking with 1 800 quit now counselor.  They call every 2 weeks.  She has been getting patches and lozenges with them.  Upper abdomen hernia: Continues to have protrusion in upper abdomen, worse with coughing, sneezing.  Often has to hold epigastric area with coughing or Valsalva to limit pain.  About 2 weeks ago fell down the stairs right leg gave out from under her, sat down hard on the step, bumped down several steps.  Has been having worsening back pain since then, feels like a pinched nerve.  Has had back surgery in the past.  Relevant past medical, surgical, family and social history reviewed. Allergies and medications reviewed and updated. Social History   Tobacco Use  Smoking Status Current Every Day  Smoker  . Packs/day: 0.50  . Years: 45.00  . Pack years: 22.50  . Types: Cigarettes  Smokeless Tobacco Never Used   ROS: Per HPI   Objective:    BP 123/79   Pulse 95   Temp 97.6 F (36.4 C) (Oral)   Resp (!) 22   Ht '5\' 4"'$  (1.626 m)   Wt 258 lb 6.4 oz (117.2 kg)   LMP 10/15/2015   SpO2 96%   BMI 44.35 kg/m   Wt Readings from Last 3 Encounters:  08/17/18 258 lb 6.4 oz (117.2 kg)  03/15/18 259 lb (117.5 kg)  03/02/18 257 lb (116.6 kg)   Gen: NAD, alert, cooperative with exam, NCAT EYES: EOMI, no conjunctival injection, or no icterus ENT:  OP without erythema LYMPH: no cervical LAD CV: NRRR, normal S1/S2, no murmur, distal pulses 2+ b/l Resp: CTABL, no wheezes, normal WOB Abd: +BS, soft, NTND.  Approximately 10 cm soft mass that increases in size with Valsalva in epigastric area.  Easily reducible.  Ext: No edema, warm Neuro: Alert and oriented MSK: Slight yellow bruising right hand dorsum of fifth metatarsal. Left middle finger not able to bend very much  Assessment & Plan:  Jaelyn was seen today for medical management of chronic issues.  Diagnoses and all orders for this visit:  Diabetes mellitus without complication (HCC) D6U 7.1.  Continue for [employer], metformin.  Offered nutrition referral, patient declined.  She is in alternate living situation, she will have more control over the  food in the kitchen which she is looking forward to. -     BMP8+EGFR -     Lipid panel -     Bayer DCA Hb A1c Waived -     metFORMIN (GLUCOPHAGE) 1000 MG tablet; Take 1 tablet (1,000 mg total) by mouth 2 (two) times daily. -     dapagliflozin propanediol (FARXIGA) 10 MG TABS tablet; Take 10 mg by mouth daily.  Hyperlipidemia, unspecified hyperlipidemia type Stable, continue below -     BMP8+EGFR -     Lipid panel -     Bayer DCA Hb A1c Waived -     atorvastatin (LIPITOR) 20 MG tablet; Take one tablet by mouth at 6pm daily  Essential hypertension Stable, not currently on medicine.  If  she restarts taking Adderall in future, will need close watch on blood pressures. -     BMP8+EGFR -     Lipid panel -     Bayer DCA Hb A1c Waived  Depression, unspecified depression type Bipolar affective disorder, remission status unspecified ([place]) Symptoms stable, to come and go.  Will save at home now.  Would like to establish care with a different psychiatrist. -     Ambulatory referral to Psychiatry -     QUEtiapine (SEROQUEL) 100 MG tablet; Take 2 tablets (200 mg total) by mouth at bedtime.  Chronic obstructive pulmonary disease, unspecified COPD type (HCC) Stable, continue below -     albuterol (PROVENTIL) (2.5 MG/3ML) 0.083% nebulizer solution; Take 3 mLs (2.5 mg total) by nebulization every 6 (six) hours as needed. -     albuterol (PROVENTIL HFA;VENTOLIN HFA) 108 (90 Base) MCG/ACT inhaler; Inhale 1-2 puffs into the lungs every 6 (six) hours as needed for wheezing or shortness of breath. -     fluticasone (FLOVENT HFA) 110 MCG/ACT inhaler; Inhale 2 puffs into the lungs 2 (two) times daily.  Trigger middle finger of left hand Ongoing symptoms, will refer for further evaluation. -     Ambulatory referral to Orthopedic Surgery   Pain of right hand Broke and 2 years ago.  Still with lingering pain. -     Ambulatory referral to Orthopedic Surgery -     DG Hand Complete Right; Future  Cough Stable, continue below -     gabapentin (NEURONTIN) 100 MG capsule; Take 1 capsule (100 mg total) by mouth 2 (two) times daily.  Acute midline low back pain without sciatica -     DG Lumbar Spine 2-3 Views; Future  Screen for colon cancer Wants to be seen in [place]. -     Ambulatory referral to Gastroenterology  Epigastric pain Apparent hernia present on exam.  Will get CT scan for further evaluation. -     CT ABDOMEN LIMITED W CONTRAST; Future   Follow up plan: Return in about 3 months (around 11/17/2018). Kimberly Found, MD [place]

## 2018-08-18 LAB — BMP8+EGFR
BUN/Creatinine Ratio: 10 (ref 9–23)
BUN: 8 mg/dL (ref 6–24)
CALCIUM: 9.9 mg/dL (ref 8.7–10.2)
CO2: 24 mmol/L (ref 20–29)
CREATININE: 0.83 mg/dL (ref 0.57–1.00)
Chloride: 100 mmol/L (ref 96–106)
GFR, EST AFRICAN AMERICAN: 92 mL/min/{1.73_m2} (ref >59)
GFR, EST NON AFRICAN AMERICAN: 80 mL/min/{1.73_m2} (ref >59)
Glucose: 277 mg/dL — ABNORMAL HIGH (ref 65–99)
POTASSIUM: 4.2 mmol/L (ref 3.5–5.2)
Sodium: 140 mmol/L (ref 134–144)

## 2018-08-18 LAB — LIPID PANEL
CHOL/HDL RATIO: 4.8 ratio — AB (ref 0.0–4.4)
Cholesterol, Total: 157 mg/dL (ref 100–199)
HDL: 33 mg/dL — AB (ref >39)
LDL Calculated: 66 mg/dL (ref 0–99)
TRIGLYCERIDES: 288 mg/dL — AB (ref 0–149)
VLDL Cholesterol Cal: 58 mg/dL — ABNORMAL HIGH (ref 5–40)

## 2018-08-21 DIAGNOSIS — Z7689 Persons encountering health services in other specified circumstances: Secondary | ICD-10-CM | POA: Diagnosis not present

## 2018-08-23 ENCOUNTER — Ambulatory Visit (INDEPENDENT_AMBULATORY_CARE_PROVIDER_SITE_OTHER): Payer: Medicaid Other | Admitting: Orthopaedic Surgery

## 2018-08-25 IMAGING — DX DG FOOT COMPLETE 3+V*R*
3 series · 3 of 3 positions shown · non-contrast
Comparison: None.

CLINICAL DATA: Painful great toe.

EXAM:
RIGHT FOOT COMPLETE - 3+ VIEW

[foot ap]
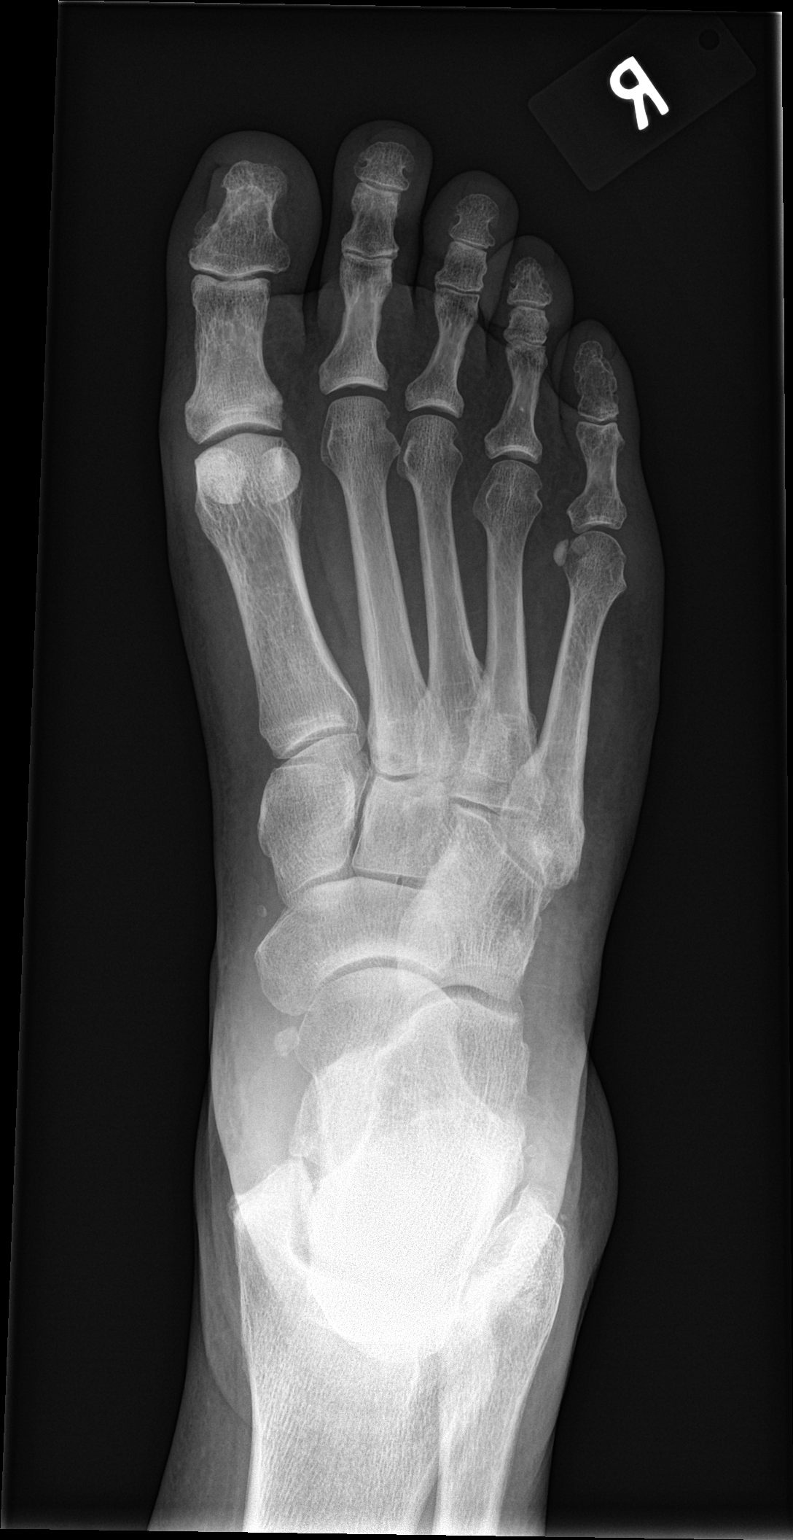

[foot obl]
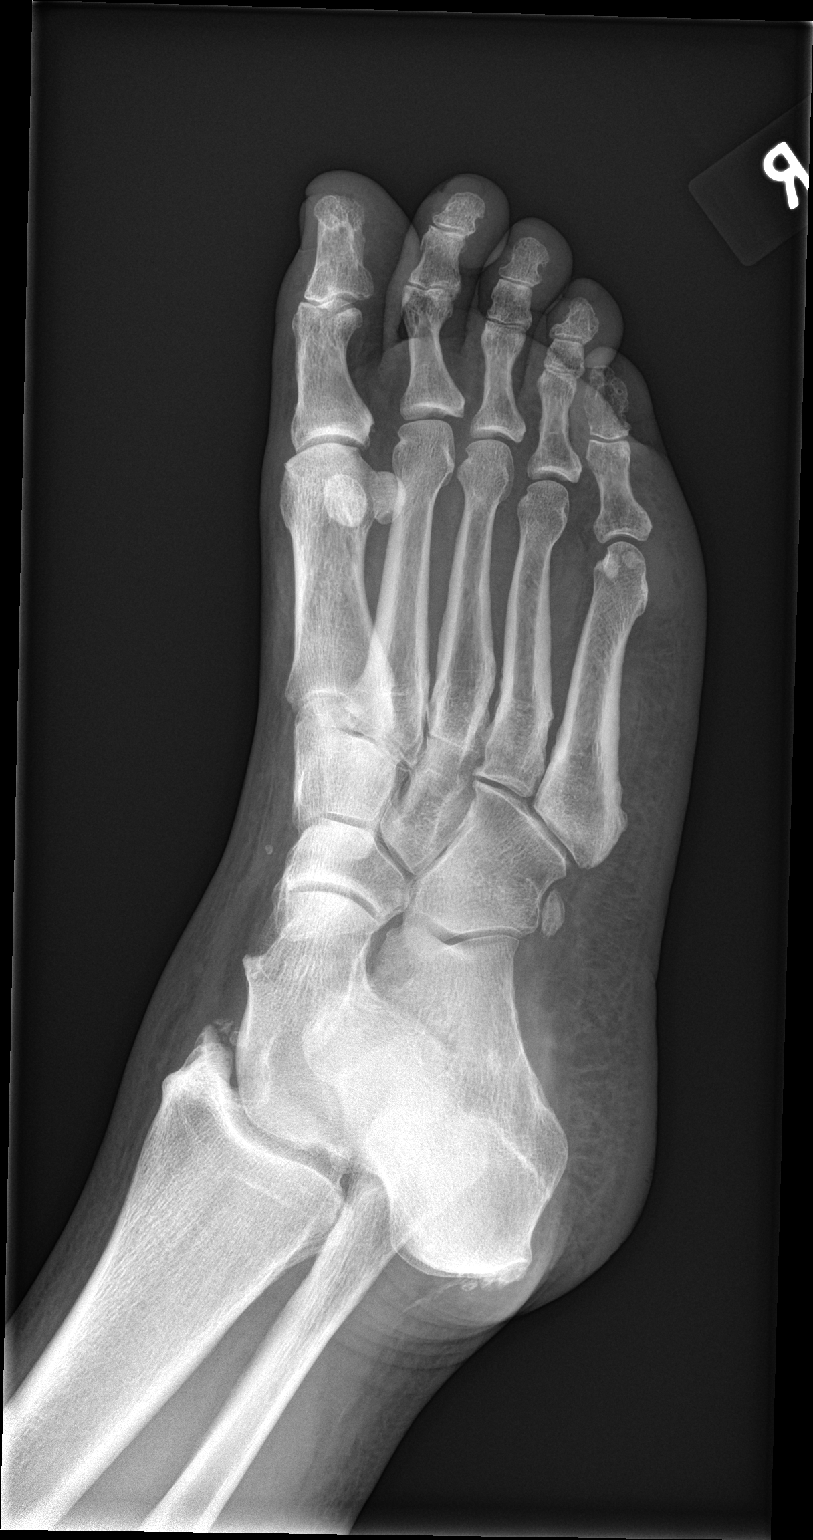

[foot lat]
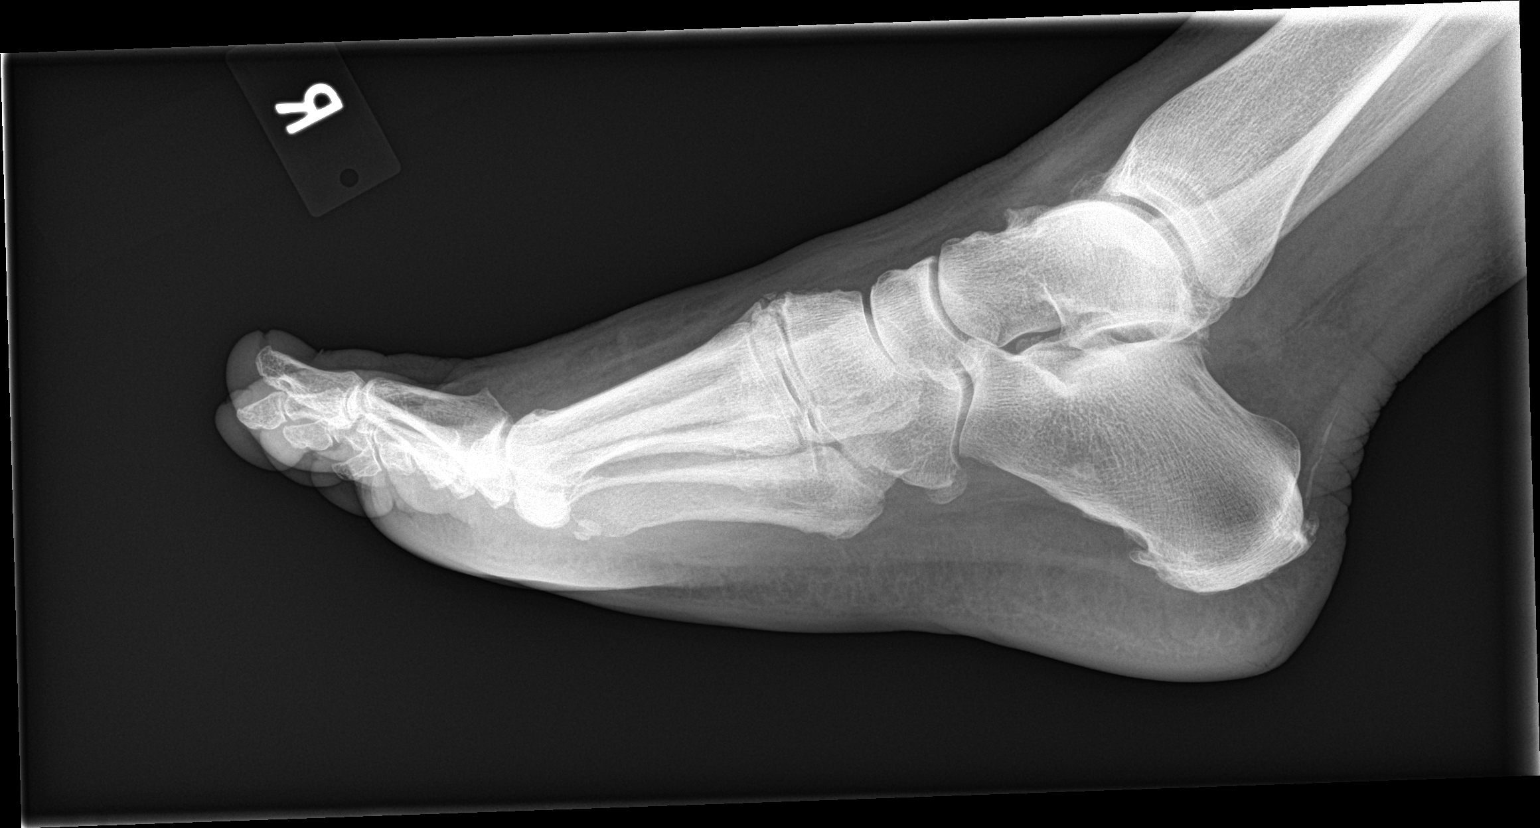

[3 of 3 positions shown; findings below may reference images not displayed]

FINDINGS: No hallux valgus deformity. No joint space narrowing. No osteophyte
formation. No erosions. Somewhat unusual upward pointing
configuration of the proximal dorsal proximal phalanx of the great
toe. This could cause local irritation. The remainder of the foot is
negative except for ordinary mild midfoot degenerative changes and
small calcaneal spurs.
IMPRESSION: Somewhat unusual upward pointing configuration of the proximal
dorsal proximal phalanx of the great toe which could cause local
irritation. This could be a small osteochondroma.

## 2018-08-27 DIAGNOSIS — Z7689 Persons encountering health services in other specified circumstances: Secondary | ICD-10-CM | POA: Diagnosis not present

## 2018-08-28 ENCOUNTER — Ambulatory Visit (INDEPENDENT_AMBULATORY_CARE_PROVIDER_SITE_OTHER): Payer: Medicaid Other | Admitting: Orthopaedic Surgery

## 2018-08-31 ENCOUNTER — Telehealth: Payer: Self-pay | Admitting: Pediatrics

## 2018-08-31 NOTE — Telephone Encounter (Signed)
Pt was seen last week, she is wanting to speak to dr Evette Doffing about seeing someone about the hernia first and she hasn't heard from anyone about a hernia???

## 2018-08-31 NOTE — Telephone Encounter (Signed)
Patient aware that CT scan has been ordered.

## 2018-09-01 IMAGING — DX DG KNEE 1-2V*R*
2 series · 2 of 2 positions shown · non-contrast
Comparison: None.

CLINICAL DATA: Knee pain

EXAM:
RIGHT KNEE - 1-2 VIEW

[knee ap]
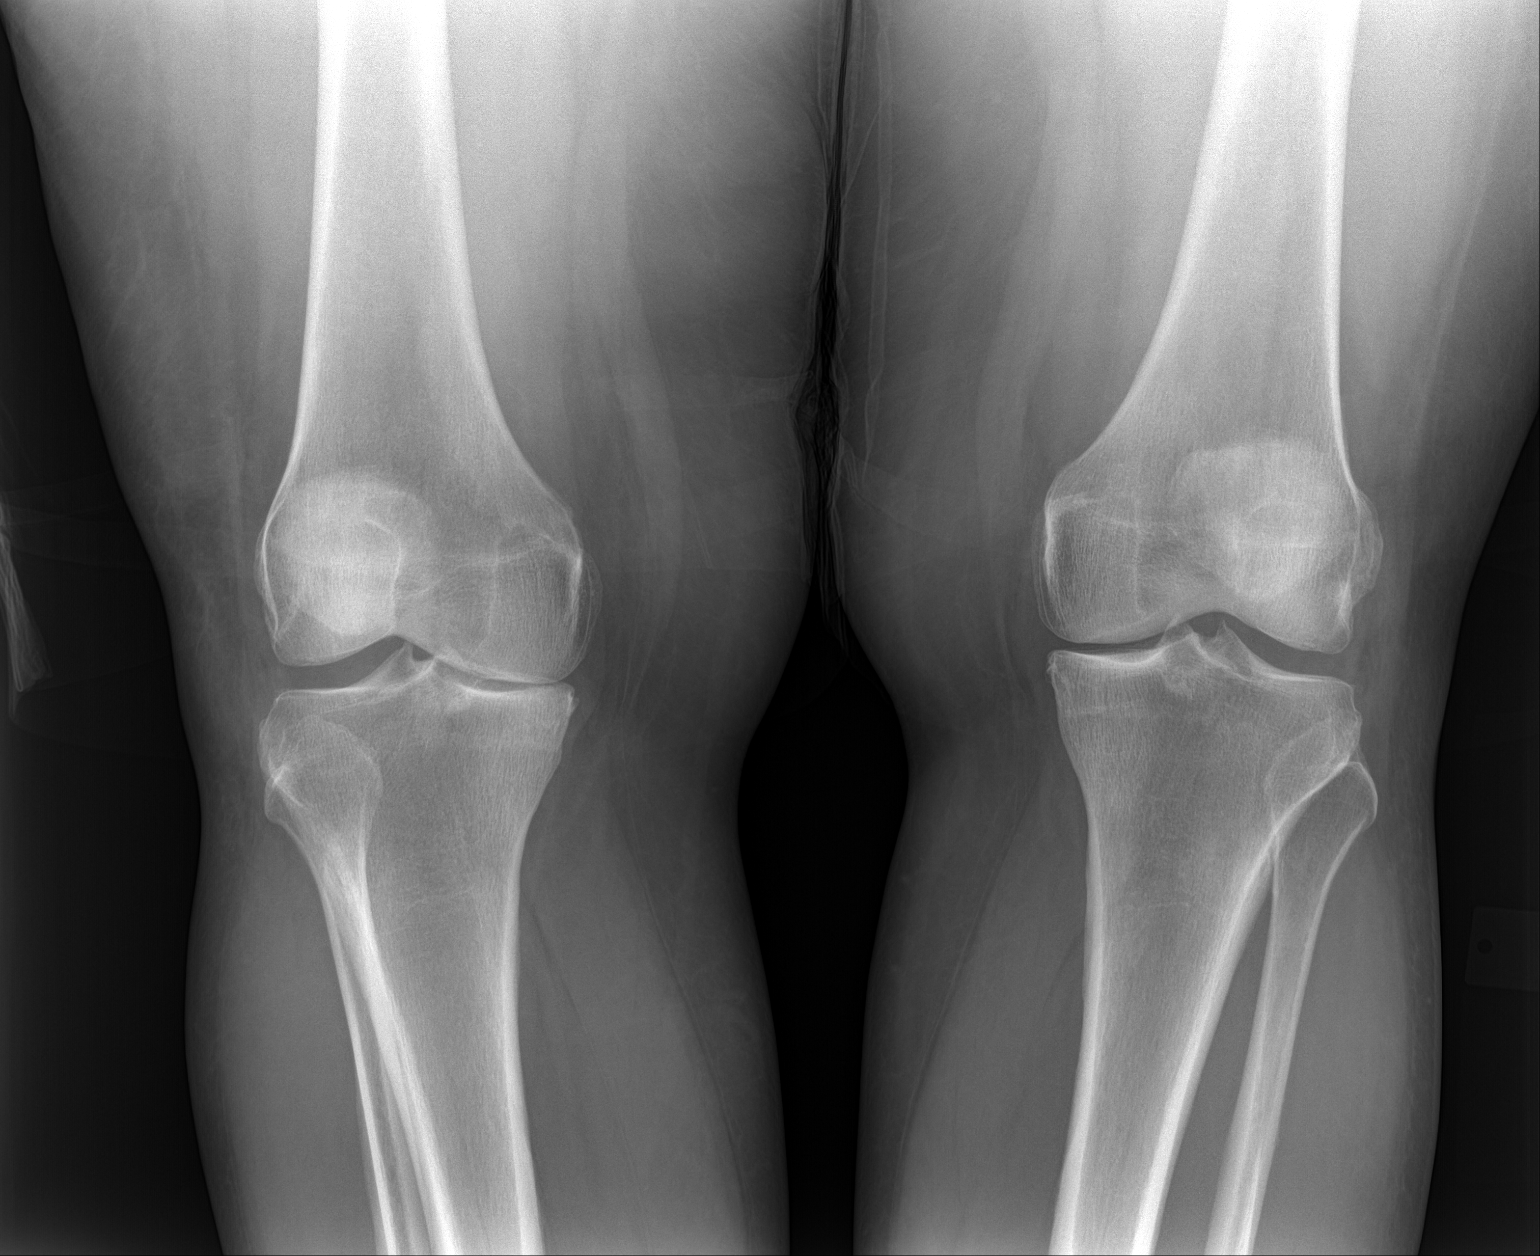

[knee lat]
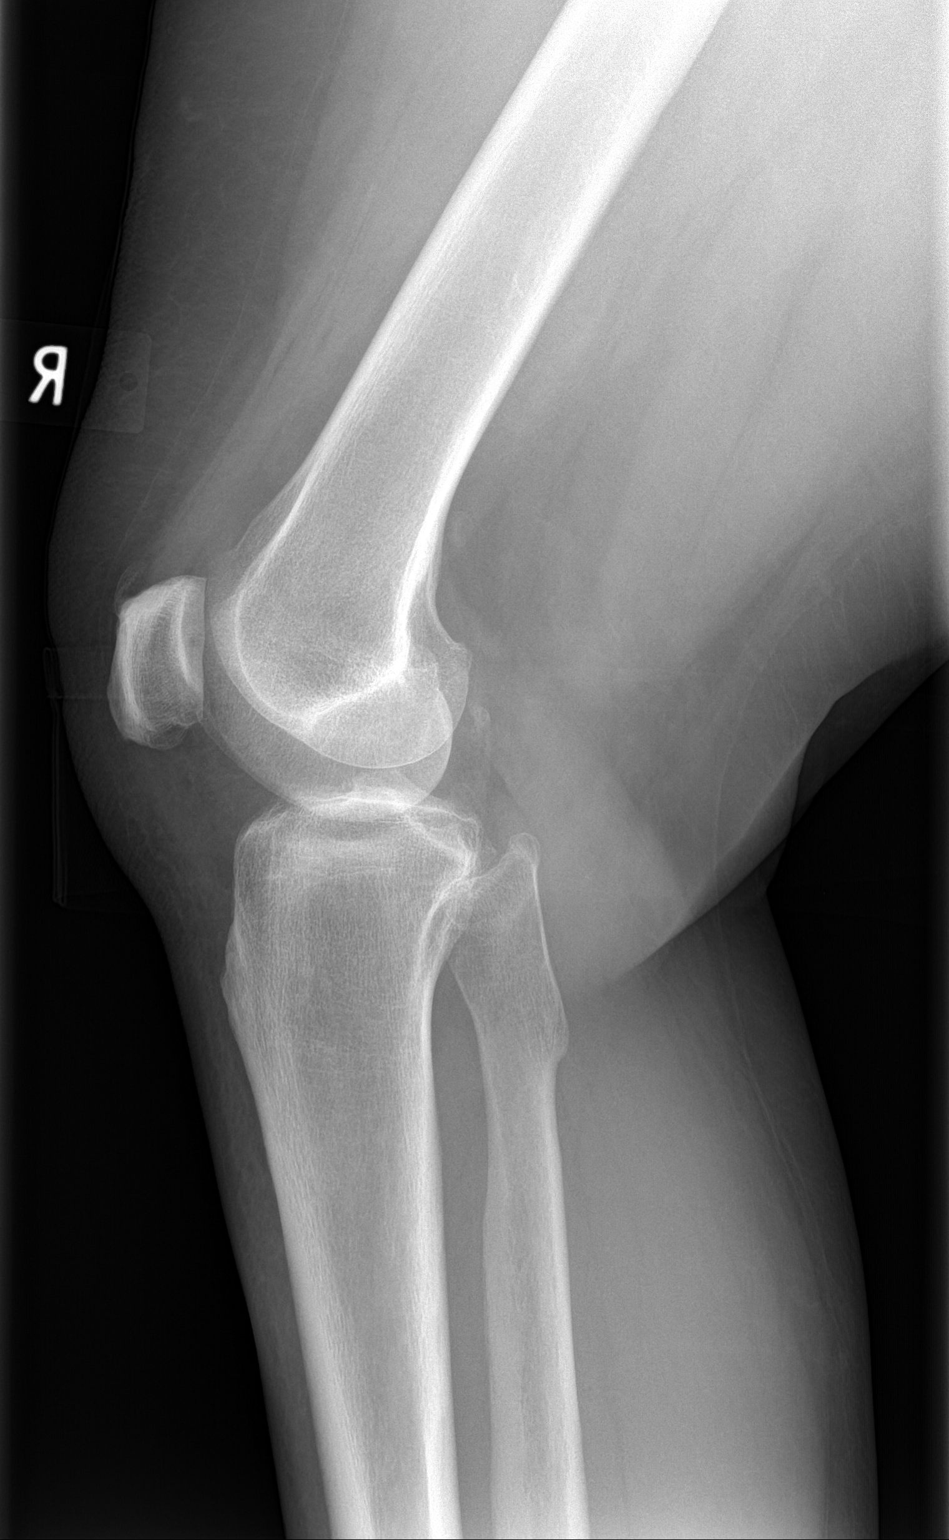

[2 of 2 positions shown; findings below may reference images not displayed]

FINDINGS: Moderate narrowing of the medial joint space on the left. Moderate
severe narrowing of the medial joint space compartment right knee
with mild compensatory widening of the lateral compartment. No
fracture. Mild patellofemoral degenerative changes with minimal
spurring. Trace suprapatellar effusion.
IMPRESSION: Moderate-to-marked degenerative change primarily involving medial
joint space of the right knee. Trace suprapatellar effusion.

## 2018-09-04 ENCOUNTER — Other Ambulatory Visit: Payer: Self-pay | Admitting: Pediatrics

## 2018-09-18 ENCOUNTER — Ambulatory Visit (HOSPITAL_COMMUNITY): Payer: Medicaid Other

## 2018-09-20 ENCOUNTER — Telehealth: Payer: Self-pay | Admitting: Pediatrics

## 2018-09-20 DIAGNOSIS — E119 Type 2 diabetes mellitus without complications: Secondary | ICD-10-CM

## 2018-09-20 NOTE — Telephone Encounter (Signed)
[facility] has called states that per pt she is saying that she is only taking one tablet daily of the metFORMIN (GLUCOPHAGE) 1000 MG tablet if this is right, the pharmacy is needing a new rx for insurance purposes.

## 2018-09-25 MED ORDER — METFORMIN HCL 1000 MG PO TABS: 1000.0000 mg | ORAL_TABLET | Freq: Every day | ORAL | 1 refills | Status: DC

## 2018-09-28 DIAGNOSIS — T7840XA Allergy, unspecified, initial encounter: Secondary | ICD-10-CM | POA: Diagnosis not present

## 2018-09-28 DIAGNOSIS — R0689 Other abnormalities of breathing: Secondary | ICD-10-CM | POA: Diagnosis not present

## 2018-09-28 DIAGNOSIS — T782XXA Anaphylactic shock, unspecified, initial encounter: Secondary | ICD-10-CM | POA: Diagnosis not present

## 2018-09-28 DIAGNOSIS — R069 Unspecified abnormalities of breathing: Secondary | ICD-10-CM | POA: Diagnosis not present

## 2018-10-29 ENCOUNTER — Other Ambulatory Visit: Payer: Self-pay | Admitting: Pediatrics

## 2018-10-29 DIAGNOSIS — R05 Cough: Secondary | ICD-10-CM

## 2018-10-29 DIAGNOSIS — R059 Cough, unspecified: Secondary | ICD-10-CM

## 2018-11-07 ENCOUNTER — Ambulatory Visit (HOSPITAL_COMMUNITY): Payer: Self-pay | Admitting: Psychiatry

## 2018-11-21 ENCOUNTER — Ambulatory Visit: Payer: Medicaid Other | Admitting: Pediatrics

## 2018-11-22 ENCOUNTER — Other Ambulatory Visit: Payer: Self-pay | Admitting: Internal Medicine

## 2018-11-28 ENCOUNTER — Encounter: Payer: Self-pay | Admitting: Family Medicine

## 2018-11-28 ENCOUNTER — Ambulatory Visit: Payer: Medicaid Other | Admitting: Family Medicine

## 2018-11-28 VITALS — BP 119/80 | HR 93 | Temp 97.4°F | Ht 64.0 in | Wt 249.0 lb

## 2018-11-28 DIAGNOSIS — R1906 Epigastric swelling, mass or lump: Secondary | ICD-10-CM

## 2018-11-28 DIAGNOSIS — Z114 Encounter for screening for human immunodeficiency virus [HIV]: Secondary | ICD-10-CM | POA: Diagnosis not present

## 2018-11-28 DIAGNOSIS — Z7689 Persons encountering health services in other specified circumstances: Secondary | ICD-10-CM | POA: Diagnosis not present

## 2018-11-28 DIAGNOSIS — F339 Major depressive disorder, recurrent, unspecified: Secondary | ICD-10-CM | POA: Diagnosis not present

## 2018-11-28 DIAGNOSIS — E119 Type 2 diabetes mellitus without complications: Secondary | ICD-10-CM

## 2018-11-28 DIAGNOSIS — E785 Hyperlipidemia, unspecified: Secondary | ICD-10-CM | POA: Diagnosis not present

## 2018-11-28 DIAGNOSIS — E1169 Type 2 diabetes mellitus with other specified complication: Secondary | ICD-10-CM | POA: Diagnosis not present

## 2018-11-28 DIAGNOSIS — M65332 Trigger finger, left middle finger: Secondary | ICD-10-CM

## 2018-11-28 DIAGNOSIS — I10 Essential (primary) hypertension: Secondary | ICD-10-CM | POA: Diagnosis not present

## 2018-11-28 DIAGNOSIS — R1013 Epigastric pain: Secondary | ICD-10-CM

## 2018-11-28 LAB — BAYER DCA HB A1C WAIVED: HB A1C (BAYER DCA - WAIVED): 7.8 % — ABNORMAL HIGH (ref <7.0)

## 2018-11-28 NOTE — Progress Notes (Signed)
Subjective:  Patient ID: Kimberly Green, female    DOB: August 07, 1961, 58 y.o.   MRN: [id number]  Chief Complaint:  Medical Management of Chronic Issues   HPI: Kimberly Green is a 58 y.o. female presenting on 11/28/2018 for Medical Management of Chronic Issues   1. Diabetes mellitus without complication ([place])  Diabetes mellitus 2 Compliant with meds - Yes Checking CBGs? No Exercising regularly? - No Watching carbohydrate intake? - Yes Neuropathy ? - No Hypoglycemic events - No  Pertinent ROS:  Polyuria - No Polydipsia - No Vision problems - No   2. Hyperlipidemia Does try to watch diet. Has started a new diet this week. Does not exercise on a regular basis.    3. Essential hypertension  Complaint with meds - Yes Checking BP at home - No Exercising Regularly - No Watching Salt intake - Yes Pertinent ROS:  Headache - No Chest pain - No Dyspnea - No Palpitations - No LE edema - Yes, minimal and worse in the evening They report good compliance with medications and can restate their regimen by memory. No medication side effects.  BP Readings from Last 3 Encounters:  11/28/18 119/80  08/17/18 123/79  03/15/18 126/70     4. Depression, recurrent ([place])  Ongoing. Does see psychiatry but would like to see someone new. Declines medications as this time. She denies SI or HI.   5. Trigger middle finger of left hand  Ongoing for several months and worsening. States her finger now locks into place. She was to see ortho but missed her appointment due to family circumstances. Would like a new referral.   6. Epigastric pain   7. Epigastric mass  Ongoing for several months. Was supposed to have a CT of her abdomen in November but was unable due to family circumstances. States she still has the epigastric mass and reflux. States she would like to have the CT at this time. Pt denies nausea, vomiting, intense pain, fever, chills, confusion, or changes in bowel habits or  color.      Relevant past medical, surgical, family, and social history reviewed and updated as indicated.  Allergies and medications reviewed and updated.   Past Medical History:  Diagnosis Date  . Adrenal tumor, benign    S/P L adrenal resection, 10/11 ([place])  . Asthma   . Bipolar disorder ([place])   . Cholelithiasis   . COPD (chronic obstructive pulmonary disease) ([place])   . Diabetes mellitus without complication ([place])   . Essential hypertension, benign   . GERD (gastroesophageal reflux disease)   . IBS (irritable bowel syndrome)   . Mixed hyperlipidemia   . PTSD (post-traumatic stress disorder)   . Renal carcinoma ([place])    S/P partial R nephrectomy, 8/11 ([place])  . Sleep apnea     Past Surgical History:  Procedure Laterality Date  . BACK SURGERY    . CHOLECYSTECTOMY    . KIDNEY MASS REMOVAL    . NECK SURGERY    . RIGHT WRIST SURGERY    . SHOULDER SURGERY Left   . TUBAL LIGATION      Social History   Socioeconomic History  . Marital status: Divorced    Spouse name: Not on file  . Number of children: 2  . Years of education: 77  . Highest education level: Not on file  Occupational History  . Not on file  Social Needs  . Financial resource strain: Not on file  . Food insecurity:  Worry: Not on file    Inability: Not on file  . Transportation needs:    Medical: Not on file    Non-medical: Not on file  Tobacco Use  . Smoking status: Current Every Day Smoker    Packs/day: 0.50    Years: 45.00    Pack years: 22.50    Types: Cigarettes  . Smokeless tobacco: Never Used  Substance and Sexual Activity  . Alcohol use: No  . Drug use: No  . Sexual activity: Not on file  Lifestyle  . Physical activity:    Days per week: Not on file    Minutes per session: Not on file  . Stress: Not on file  Relationships  . Social connections:    Talks on phone: Not on file    Gets together: Not on file    Attends religious service: Not on file    Active member of club  or organization: Not on file    Attends meetings of clubs or organizations: Not on file    Relationship status: Not on file  . Intimate partner violence:    Fear of current or ex partner: Not on file    Emotionally abused: Not on file    Physically abused: Not on file    Forced sexual activity: Not on file  Other Topics Concern  . Not on file  Social History Narrative   Drinks 1 cup of coffee a day     Outpatient Encounter Medications as of 11/28/2018  Medication Sig  . albuterol (PROVENTIL HFA;VENTOLIN HFA) 108 (90 Base) MCG/ACT inhaler Inhale 1-2 puffs into the lungs every 6 (six) hours as needed for wheezing or shortness of breath.  Marland Kitchen albuterol (PROVENTIL) (2.5 MG/3ML) 0.083% nebulizer solution Take 3 mLs (2.5 mg total) by nebulization every 6 (six) hours as needed.  Marland Kitchen amphetamine-dextroamphetamine (ADDERALL) 10 MG tablet TAKE 1 TABLET BY MOUTH EVERY MORNING AND TAKE 1 TABLET BY MOUTH EVERY DAY AT NOON - MAY FILL IN JULY  . atorvastatin (LIPITOR) 20 MG tablet Take one tablet by mouth at 6pm daily  . Blood Glucose Monitoring Suppl (ACCU-CHEK AVIVA PLUS) w/Device KIT Use to check BG bid  . FARXIGA 5 MG TABS tablet TAKE 1 TABLET BY MOUTH TWICE DAILY  . fluticasone (FLOVENT HFA) 110 MCG/ACT inhaler Inhale 2 puffs into the lungs 2 (two) times daily.  Marland Kitchen gabapentin (NEURONTIN) 100 MG capsule Take 1 capsule (100 mg total) by mouth 2 (two) times daily.  Marland Kitchen glucose blood (ACCU-CHEK AVIVA PLUS) test strip Use to check BG twice daily  . ibuprofen (ADVIL,MOTRIN) 200 MG tablet Take 200 mg by mouth every 6 (six) hours as needed.  . Lancets (ACCU-CHEK SOFT TOUCH) lancets Use to check BG twice a day  . metFORMIN (GLUCOPHAGE) 1000 MG tablet Take 1 tablet (1,000 mg total) by mouth daily with breakfast.  . QUEtiapine (SEROQUEL) 100 MG tablet Take 2 tablets (200 mg total) by mouth at bedtime.   No facility-administered encounter medications on file as of 11/28/2018.     Allergies  Allergen Reactions  .  Lexiscan [Regadenoson] Shortness Of Breath    Severe transient respiratory distress    Review of Systems  Constitutional: Negative for chills, fatigue, fever and unexpected weight change.  Eyes: Negative for photophobia and visual disturbance.  Respiratory: Positive for cough and wheezing. Negative for chest tightness and shortness of breath.   Cardiovascular: Positive for leg swelling. Negative for chest pain and palpitations.  Gastrointestinal: Positive for abdominal distention (epigatric  area). Negative for abdominal pain, anal bleeding, blood in stool, constipation, diarrhea, nausea, rectal pain and vomiting.  Endocrine: Negative for polydipsia, polyphagia and polyuria.  Musculoskeletal: Positive for arthralgias (left middle finger) and joint swelling (left middle finger).  Skin: Negative for color change, pallor, rash and wound.  Neurological: Negative for dizziness, tremors, seizures, syncope, facial asymmetry, speech difficulty, weakness, light-headedness and numbness.  Psychiatric/Behavioral: Negative for confusion.  All other systems reviewed and are negative.       Objective:  BP 119/80   Pulse 93   Temp (!) 97.4 F (36.3 C) (Oral)   Ht '5\' 4"'$  (1.626 m)   Wt 249 lb (112.9 kg)   LMP 10/15/2015   BMI 42.74 kg/m    Wt Readings from Last 3 Encounters:  11/28/18 249 lb (112.9 kg)  08/17/18 258 lb 6.4 oz (117.2 kg)  03/15/18 259 lb (117.5 kg)    Physical Exam Vitals signs and nursing note reviewed.  Constitutional:      General: She is not in acute distress.    Appearance: Normal appearance. She is well-developed and well-groomed. She is not ill-appearing or toxic-appearing.  HENT:     Head: Normocephalic and atraumatic.     Right Ear: Hearing, tympanic membrane, ear canal and external ear normal.     Left Ear: Hearing, tympanic membrane, ear canal and external ear normal.     Mouth/Throat:     Lips: Pink.     Mouth: Mucous membranes are moist.     Pharynx:  Oropharynx is clear.  Eyes:     Extraocular Movements: Extraocular movements intact.     Conjunctiva/sclera: Conjunctivae normal.     Pupils: Pupils are equal, round, and reactive to light.  Neck:     Musculoskeletal: Full passive range of motion without pain, normal range of motion and neck supple. No neck rigidity or muscular tenderness.     Thyroid: No thyroid mass, thyromegaly or thyroid tenderness.     Vascular: No carotid bruit or JVD.     Trachea: Trachea and phonation normal.  Cardiovascular:     Rate and Rhythm: Normal rate and regular rhythm.     Pulses: Normal pulses.     Heart sounds: Normal heart sounds. No murmur. No friction rub. No gallop.   Pulmonary:     Effort: Pulmonary effort is normal. No respiratory distress.     Breath sounds: Wheezing (bilateral bases, mild) present.  Abdominal:     General: Abdomen is protuberant. Bowel sounds are normal. There is no abdominal bruit.     Palpations: Abdomen is soft.     Tenderness: There is no abdominal tenderness.     Hernia: A hernia is present.    Musculoskeletal:     Left hand: She exhibits decreased range of motion, tenderness and swelling. She exhibits no bony tenderness, normal two-point discrimination, normal capillary refill, no deformity and no laceration. Normal sensation noted.       Hands:     Right lower leg: 1+ Edema present.     Left lower leg: 1+ Edema present.  Lymphadenopathy:     Cervical: No cervical adenopathy.  Skin:    General: Skin is warm.     Capillary Refill: Capillary refill takes less than 2 seconds.     Coloration: Skin is not jaundiced or pale.  Neurological:     General: No focal deficit present.     Mental Status: She is alert and oriented to person, place, and time.  Cranial Nerves: No cranial nerve deficit.     Sensory: No sensory deficit.     Motor: No weakness.     Coordination: Coordination normal.     Gait: Gait normal.     Deep Tendon Reflexes: Reflexes normal.    Psychiatric:        Attention and Perception: Attention and perception normal.        Mood and Affect: Affect normal. Mood is depressed.        Speech: Speech normal.        Behavior: Behavior normal. Behavior is cooperative.        Thought Content: Thought content normal. Thought content is not paranoid or delusional. Thought content does not include homicidal or suicidal ideation. Thought content does not include homicidal or suicidal plan.        Cognition and Memory: Cognition and memory normal.        Judgment: Judgment normal.     Results for orders placed or performed in visit on 08/17/18  [facility]  Result Value Ref Range   Glucose 277 (H) 65 - 99 mg/dL   BUN 8 6 - 24 mg/dL   Creatinine, Ser 0.83 0.57 - 1.00 mg/dL   GFR calc non Af Amer 80 >59 mL/min/1.73   GFR calc Af Amer 92 >59 mL/min/1.73   BUN/Creatinine Ratio 10 9 - 23   Sodium 140 134 - 144 mmol/L   Potassium 4.2 3.5 - 5.2 mmol/L   Chloride 100 96 - 106 mmol/L   CO2 24 20 - 29 mmol/L   Calcium 9.9 8.7 - 10.2 mg/dL  Lipid panel  Result Value Ref Range   Cholesterol, Total 157 100 - 199 mg/dL   Triglycerides 288 (H) 0 - 149 mg/dL   HDL 33 (L) >39 mg/dL   VLDL Cholesterol Cal 58 (H) 5 - 40 mg/dL   LDL Calculated 66 0 - 99 mg/dL   Chol/HDL Ratio 4.8 (H) 0.0 - 4.4 ratio  Bayer DCA Hb A1c Waived  Result Value Ref Range   HB A1C (BAYER DCA - WAIVED) 7.1 (H) <7.0 %     A1C 7.8 in office today.   Pertinent labs & imaging results that were available during my care of the patient were reviewed by me and considered in my medical decision making.  Assessment & Plan:  Vearl was seen today for medical management of chronic issues.  Diagnoses and all orders for this visit:  Diabetes mellitus without complication (HCC) B2W 7.8 today. Diet and exercise encouraged. Will recheck in 3 months and if remains elevated will adjust therapy. Continue medications as prescribed.  -     CBC with Differential/Platelet -      CMP14+EGFR -     Bayer DCA Hb A1c Waived  Hyperlipidemia, unspecified hyperlipidemia type Labs pending. Diet and exercise encouraged. Continue medications as prescribed.  -     CBC with Differential/Platelet -     CMP14+EGFR -     Lipid panel  Essential hypertension Stable. Continue medications as prescribed.  -     CBC with Differential/Platelet -     CMP14+EGFR  Screening for HIV without presence of risk factors -     HIV Antibody (routine testing w rflx)  Depression, recurrent ([place]) States she sees psychiatry. Would like new referral but not at this time. Wants to get finger and hernia taken care of first.   Trigger middle finger of left hand Ongoing. Missed appointment with ortho, new referral placed.  -  Ambulatory referral to Orthopedic Surgery  Epigastric pain Has not had CT, will reorder today.  -     CT ABDOMEN W CONTRAST; Future  Epigastric mass Has not had CT, will reorder today.   -     CT ABDOMEN W CONTRAST; Future     Continue all other maintenance medications.  Follow up plan: Return in about 3 months (around 02/26/2019), or if symptoms worsen or fail to improve.  Educational handout given for depression   The above assessment and management plan was discussed with the patient. The patient verbalized understanding of and has agreed to the management plan. Patient is aware to call the clinic if symptoms persist or worsen. Patient is aware when to return to the clinic for a follow-up visit. Patient educated on when it is appropriate to go to the emergency department.   Monia Pouch, FNP-C [facility] [phone]

## 2018-11-28 NOTE — Patient Instructions (Signed)
If your symptoms worsen or you have thoughts of suicide/homicide, PLEASE SEEK IMMEDIATE MEDICAL ATTENTION.  You may always call the National Suicide Hotline.  This is available 24 hours a day, 7 days a week.  Their number is: 1-800-273-8255  Taking the medicine as directed and not missing any doses is one of the best things you can do to treat your depression.  Here are some things to keep in mind:  1) Side effects (stomach upset, some increased anxiety) may happen before you notice a benefit.  These side effects typically go away over time. 2) Changes to your dose of medicine or a change in medication all together is sometimes necessary 3) Most people need to be on medication at least 12 months 4) Many people will notice an improvement within two weeks but the full effect of the medication can take up to 4-6 weeks 5) Stopping the medication when you start feeling better often results in a return of symptoms 6) Never discontinue your medication without contacting a health care professional first.  Some medications require gradual discontinuation/ taper and can make you sick if you stop them abruptly.  If your symptoms worsen or you have thoughts of suicide/homicide, PLEASE SEEK IMMEDIATE MEDICAL ATTENTION.  You may always call:  National Suicide Hotline: 800-273-8255 [place] Crisis Line: 336-832-9700 Crisis Recovery in [place]: 800-939-5911   These are available 24 hours a day, 7 days a week.   

## 2018-11-29 LAB — CMP14+EGFR
ALT: 37 IU/L — ABNORMAL HIGH (ref 0–32)
AST: 33 IU/L (ref 0–40)
Albumin/Globulin Ratio: 1.7 (ref 1.2–2.2)
Albumin: 4 g/dL (ref 3.8–4.9)
Alkaline Phosphatase: 100 IU/L (ref 39–117)
BILIRUBIN TOTAL: 0.3 mg/dL (ref 0.0–1.2)
BUN/Creatinine Ratio: 8 — ABNORMAL LOW (ref 9–23)
BUN: 6 mg/dL (ref 6–24)
CO2: 20 mmol/L (ref 20–29)
Calcium: 9.8 mg/dL (ref 8.7–10.2)
Chloride: 105 mmol/L (ref 96–106)
Creatinine, Ser: 0.71 mg/dL (ref 0.57–1.00)
GFR calc Af Amer: 110 mL/min/{1.73_m2} (ref >59)
GFR, EST NON AFRICAN AMERICAN: 96 mL/min/{1.73_m2} (ref >59)
Globulin, Total: 2.4 g/dL (ref 1.5–4.5)
Glucose: 165 mg/dL — ABNORMAL HIGH (ref 65–99)
Potassium: 3.7 mmol/L (ref 3.5–5.2)
Sodium: 140 mmol/L (ref 134–144)
Total Protein: 6.4 g/dL (ref 6.0–8.5)

## 2018-11-29 LAB — CBC WITH DIFFERENTIAL/PLATELET
BASOS: 1 %
Basophils Absolute: 0.1 10*3/uL (ref 0.0–0.2)
EOS (ABSOLUTE): 0.3 10*3/uL (ref 0.0–0.4)
Eos: 4 %
Hematocrit: 41.3 % (ref 34.0–46.6)
Hemoglobin: 14.1 g/dL (ref 11.1–15.9)
Immature Grans (Abs): 0 10*3/uL (ref 0.0–0.1)
Immature Granulocytes: 0 %
LYMPHS ABS: 2.2 10*3/uL (ref 0.7–3.1)
Lymphs: 31 %
MCH: 29.2 pg (ref 26.6–33.0)
MCHC: 34.1 g/dL (ref 31.5–35.7)
MCV: 86 fL (ref 79–97)
Monocytes Absolute: 0.5 10*3/uL (ref 0.1–0.9)
Monocytes: 6 %
Neutrophils Absolute: 4.1 10*3/uL (ref 1.4–7.0)
Neutrophils: 58 %
Platelets: 234 10*3/uL (ref 150–450)
RBC: 4.83 x10E6/uL (ref 3.77–5.28)
RDW: 12.7 % (ref 11.7–15.4)
WBC: 7.1 10*3/uL (ref 3.4–10.8)

## 2018-11-29 LAB — LIPID PANEL
Chol/HDL Ratio: 3.5 ratio (ref 0.0–4.4)
Cholesterol, Total: 110 mg/dL (ref 100–199)
HDL: 31 mg/dL — ABNORMAL LOW (ref >39)
LDL CALC: 43 mg/dL (ref 0–99)
Triglycerides: 179 mg/dL — ABNORMAL HIGH (ref 0–149)
VLDL CHOLESTEROL CAL: 36 mg/dL (ref 5–40)

## 2018-11-29 LAB — HIV ANTIBODY (ROUTINE TESTING W REFLEX): HIV Screen 4th Generation wRfx: NONREACTIVE

## 2018-11-30 ENCOUNTER — Telehealth: Payer: Self-pay | Admitting: Family

## 2018-11-30 ENCOUNTER — Other Ambulatory Visit: Payer: Self-pay | Admitting: Family Medicine

## 2018-11-30 ENCOUNTER — Other Ambulatory Visit: Payer: Self-pay | Admitting: Family

## 2018-11-30 DIAGNOSIS — R059 Cough, unspecified: Secondary | ICD-10-CM

## 2018-11-30 DIAGNOSIS — R05 Cough: Secondary | ICD-10-CM

## 2018-11-30 MED ORDER — GABAPENTIN 300 MG PO CAPS: 300.0000 mg | ORAL_CAPSULE | Freq: Two times a day (BID) | ORAL | 2 refills | Status: DC

## 2018-11-30 NOTE — Telephone Encounter (Signed)
Patient reports she forgot to mention the other day that she needs a refill of Gabapentin 300 mg bid.  Her med list shows 100 mg but she reports she saw Dr. Melvyn Novas and increased it to 300 mg bid for cough.  She will no longer be seeing him and wonders if we can prescribe this medication for her, she uses [facility].

## 2018-12-11 ENCOUNTER — Ambulatory Visit (INDEPENDENT_AMBULATORY_CARE_PROVIDER_SITE_OTHER): Payer: Medicaid Other | Admitting: Orthopaedic Surgery

## 2018-12-13 DIAGNOSIS — Z87448 Personal history of other diseases of urinary system: Secondary | ICD-10-CM | POA: Diagnosis not present

## 2018-12-13 DIAGNOSIS — Z9889 Other specified postprocedural states: Secondary | ICD-10-CM | POA: Diagnosis not present

## 2018-12-13 DIAGNOSIS — K429 Umbilical hernia without obstruction or gangrene: Secondary | ICD-10-CM | POA: Diagnosis not present

## 2018-12-21 DIAGNOSIS — F319 Bipolar disorder, unspecified: Secondary | ICD-10-CM | POA: Diagnosis not present

## 2019-01-07 ENCOUNTER — Telehealth: Payer: Self-pay | Admitting: Family Medicine

## 2019-01-07 DIAGNOSIS — R1013 Epigastric pain: Secondary | ICD-10-CM

## 2019-01-07 NOTE — Telephone Encounter (Signed)
Small umbilical hernia containing fat, no other abnormalities that would be causing her epigastric pain. If this continues, may refer to GI for possible endoscopy. Thanks.

## 2019-01-07 NOTE — Telephone Encounter (Signed)
PT is wanting to know result of her CAT Scan she had couple weeks ago states she hasn't heard the results yet

## 2019-01-07 NOTE — Telephone Encounter (Signed)
CT has been ordered. Has not been completed. No results in chart.

## 2019-01-07 NOTE — Telephone Encounter (Signed)
Pt aware of results and states she is still having the pain and would like referral placed. Referral for GI placed.

## 2019-01-14 DIAGNOSIS — Z5181 Encounter for therapeutic drug level monitoring: Secondary | ICD-10-CM | POA: Diagnosis not present

## 2019-01-16 ENCOUNTER — Other Ambulatory Visit: Payer: Self-pay | Admitting: Cardiovascular Disease

## 2019-01-16 DIAGNOSIS — E785 Hyperlipidemia, unspecified: Secondary | ICD-10-CM

## 2019-01-17 ENCOUNTER — Ambulatory Visit (INDEPENDENT_AMBULATORY_CARE_PROVIDER_SITE_OTHER): Payer: Self-pay | Admitting: Internal Medicine

## 2019-01-17 ENCOUNTER — Other Ambulatory Visit: Payer: Self-pay | Admitting: Pediatrics

## 2019-01-17 DIAGNOSIS — E119 Type 2 diabetes mellitus without complications: Secondary | ICD-10-CM

## 2019-01-18 DIAGNOSIS — F319 Bipolar disorder, unspecified: Secondary | ICD-10-CM | POA: Diagnosis not present

## 2019-01-23 DIAGNOSIS — F319 Bipolar disorder, unspecified: Secondary | ICD-10-CM | POA: Diagnosis not present

## 2019-01-28 DIAGNOSIS — F319 Bipolar disorder, unspecified: Secondary | ICD-10-CM | POA: Diagnosis not present

## 2019-02-04 ENCOUNTER — Encounter: Payer: Self-pay | Admitting: Family Medicine

## 2019-02-04 ENCOUNTER — Ambulatory Visit (INDEPENDENT_AMBULATORY_CARE_PROVIDER_SITE_OTHER): Payer: Medicaid Other | Admitting: Family Medicine

## 2019-02-04 ENCOUNTER — Other Ambulatory Visit: Payer: Self-pay

## 2019-02-04 DIAGNOSIS — J441 Chronic obstructive pulmonary disease with (acute) exacerbation: Secondary | ICD-10-CM

## 2019-02-04 MED ORDER — PREDNISONE 20 MG PO TABS: ORAL_TABLET | ORAL | 0 refills | Status: DC

## 2019-02-04 MED ORDER — BENZONATATE 100 MG PO CAPS: 100.0000 mg | ORAL_CAPSULE | Freq: Three times a day (TID) | ORAL | 0 refills | Status: DC | PRN

## 2019-02-04 MED ORDER — LIDOCAINE VISCOUS HCL 2 % MT SOLN: 15.0000 mL | Freq: Four times a day (QID) | OROMUCOSAL | 0 refills | Status: AC | PRN

## 2019-02-04 MED ORDER — DOXYCYCLINE HYCLATE 100 MG PO TABS: 100.0000 mg | ORAL_TABLET | Freq: Two times a day (BID) | ORAL | 0 refills | Status: AC

## 2019-02-04 NOTE — Progress Notes (Signed)
Virtual Visit via telephone Note Due to COVID-19, visit is conducted virtually and was requested by patient. This visit type was conducted due to national recommendations for restrictions regarding the COVID-19 Pandemic (e.g. social distancing) in an effort to limit this patient's exposure and mitigate transmission in our community.  Due to her co-morbid illnesses, this patient is at least at moderate risk for complications without adequate follow up.  This format is felt to be most appropriate for this patient at this time.  All issues noted in this document were discussed and addressed.  A physical exam was not performed with this format.   I connected with Kimberly Green on 02/04/19 at 1605 by telephone and verified that I am speaking with the correct person using two identifiers. Kimberly Green is currently located at home and family is currently with them during visit. The provider, Monia Pouch, FNP is located in their office at time of visit.  I discussed the limitations, risks, security and privacy concerns of performing an evaluation and management service by telephone and the availability of in person appointments. I also discussed with the patient that there may be a patient responsible charge related to this service. The patient expressed understanding and agreed to proceed.  Subjective:  Patient ID: Kimberly Green, female    DOB: 1960/11/22, 58 y.o.   MRN: [id number]  Chief Complaint:  Cough   HPI: [name] is a 58 y.o. female presenting on 02/04/2019 for Cough   Pt reports increased cough, sputum production, and exertional shortness of breath. Pt states this started out as a cold over a week ago and is getting worse. States she has tried over the counter cough suppressants without relief of symptoms. She is using her SABA as needed. Pt denies fever, chills, weakness, confusion, chest pain, or fatigue. No recent travel or know sick exposures.     Relevant past medical, surgical, family, and social history reviewed and updated as indicated.  Allergies and medications reviewed and updated.   Past Medical History:  Diagnosis Date  . Adrenal tumor, benign    S/P L adrenal resection, 10/11 ([place])  . Asthma   . Bipolar disorder ([place])   . Cholelithiasis   . COPD (chronic obstructive pulmonary disease) ([place])   . Diabetes mellitus without complication ([place])   . Essential hypertension, benign   . GERD (gastroesophageal reflux disease)   . IBS (irritable bowel syndrome)   . Mixed hyperlipidemia   . PTSD (post-traumatic stress disorder)   . Renal carcinoma ([place])    S/P partial R nephrectomy, 8/11 ([place])  . Sleep apnea     Past Surgical History:  Procedure Laterality Date  . BACK SURGERY    . CHOLECYSTECTOMY    . KIDNEY MASS REMOVAL    . NECK SURGERY    . RIGHT WRIST SURGERY    . SHOULDER SURGERY Left   . TUBAL LIGATION      Social History   Socioeconomic History  . Marital status: Divorced    Spouse name: Not on file  . Number of children: 2  . Years of education: 23  . Highest education level: Not on file  Occupational History  . Not on file  Social Needs  . Financial resource strain: Not on file  . Food insecurity:    Worry: Not on file    Inability: Not on file  . Transportation needs:    Medical: Not on file    Non-medical: Not on file  Tobacco Use  . Smoking status: Current Every Day Smoker    Packs/day: 0.50    Years: 45.00    Pack years: 22.50    Types: Cigarettes  . Smokeless tobacco: Never Used  Substance and Sexual Activity  . Alcohol use: No  . Drug use: No  . Sexual activity: Not on file  Lifestyle  . Physical activity:    Days per week: Not on file    Minutes per session: Not on file  . Stress: Not on file  Relationships  . Social connections:    Talks on phone: Not on file    Gets together: Not on file    Attends religious service: Not on file    Active member of club or  organization: Not on file    Attends meetings of clubs or organizations: Not on file    Relationship status: Not on file  . Intimate partner violence:    Fear of current or ex partner: Not on file    Emotionally abused: Not on file    Physically abused: Not on file    Forced sexual activity: Not on file  Other Topics Concern  . Not on file  Social History Narrative   Drinks 1 cup of coffee a day     Outpatient Encounter Medications as of 02/04/2019  Medication Sig  . albuterol (PROVENTIL HFA;VENTOLIN HFA) 108 (90 Base) MCG/ACT inhaler Inhale 1-2 puffs into the lungs every 6 (six) hours as needed for wheezing or shortness of breath.  Marland Kitchen albuterol (PROVENTIL) (2.5 MG/3ML) 0.083% nebulizer solution Take 3 mLs (2.5 mg total) by nebulization every 6 (six) hours as needed.  Marland Kitchen amphetamine-dextroamphetamine (ADDERALL) 10 MG tablet TAKE 1 TABLET BY MOUTH EVERY MORNING AND TAKE 1 TABLET BY MOUTH EVERY DAY AT NOON - MAY FILL IN JULY  . atorvastatin (LIPITOR) 20 MG tablet Take one tablet by mouth at 6pm daily  . benzonatate (TESSALON PERLES) 100 MG capsule Take 1 capsule (100 mg total) by mouth 3 (three) times daily as needed for cough.  . Blood Glucose Monitoring Suppl (ACCU-CHEK AVIVA PLUS) w/Device KIT Use to check BG bid  . doxycycline (VIBRA-TABS) 100 MG tablet Take 1 tablet (100 mg total) by mouth 2 (two) times daily for 7 days. 1 po bid  . FARXIGA 5 MG TABS tablet TAKE 1 TABLET BY MOUTH TWICE DAILY  . fluticasone (FLOVENT HFA) 110 MCG/ACT inhaler Inhale 2 puffs into the lungs 2 (two) times daily.  Marland Kitchen gabapentin (NEURONTIN) 300 MG capsule TAKE 1 CAPSULE BY MOUTH THREE TIMES DAILY  . glucose blood (ACCU-CHEK AVIVA PLUS) test strip Use to check BG twice daily  . ibuprofen (ADVIL,MOTRIN) 200 MG tablet Take 200 mg by mouth every 6 (six) hours as needed.  . Lancets (ACCU-CHEK SOFT TOUCH) lancets Use to check BG twice a day  . lidocaine (XYLOCAINE) 2 % solution Use as directed 15 mLs in the mouth or  throat every 6 (six) hours as needed for up to 5 days (cough).  . metFORMIN (GLUCOPHAGE) 1000 MG tablet TAKE 1 TABLET BY MOUTH EVERY DAY WITH BREAKFAST  . predniSONE (DELTASONE) 20 MG tablet 2 po at sametime daily for 5 days  . QUEtiapine (SEROQUEL) 100 MG tablet Take 2 tablets (200 mg total) by mouth at bedtime.   No facility-administered encounter medications on file as of 02/04/2019.     Allergies  Allergen Reactions  . Lexiscan [Regadenoson] Shortness Of Breath    Severe transient respiratory distress  Review of Systems  Constitutional: Negative for chills, fatigue and fever.  HENT: Positive for congestion. Negative for postnasal drip, rhinorrhea, sinus pressure, sinus pain and sore throat.   Respiratory: Positive for cough, shortness of breath and wheezing. Negative for chest tightness.   Cardiovascular: Negative for chest pain and palpitations.  Gastrointestinal: Positive for nausea and vomiting.  Neurological: Negative for dizziness, weakness, numbness and headaches.  Psychiatric/Behavioral: Negative for confusion.  All other systems reviewed and are negative.        Observations/Objective: No vital signs or physical exam, this was a telephone or virtual health encounter.  Pt alert and oriented, answers all questions appropriately, and able to speak in full sentences.    Assessment and Plan: Diagnoses and all orders for this visit:  Chronic obstructive pulmonary disease with acute exacerbation ([place]) Due to increased cough, sputum production, and exertional shortness of breath, will treat with below. Pt requesting narcotic cough syrup. Pt aware this can not be prescribed over the phone. Will prescribed viscous lidocaine to help with gag / cough reflex. Medications as prescribed. Report any new or worsening symptoms. Pt aware of symptoms that require emergent evaluation.  -     doxycycline (VIBRA-TABS) 100 MG tablet; Take 1 tablet (100 mg total) by mouth 2 (two) times  daily for 7 days. 1 po bid -     benzonatate (TESSALON PERLES) 100 MG capsule; Take 1 capsule (100 mg total) by mouth 3 (three) times daily as needed for cough. -     lidocaine (XYLOCAINE) 2 % solution; Use as directed 15 mLs in the mouth or throat every 6 (six) hours as needed for up to 5 days (cough). -     predniSONE (DELTASONE) 20 MG tablet; 2 po at sametime daily for 5 days     Follow Up Instructions: Return in about 1 week (around 02/11/2019), or if symptoms worsen or fail to improve.    I discussed the assessment and treatment plan with the patient. The patient was provided an opportunity to ask questions and all were answered. The patient agreed with the plan and demonstrated an understanding of the instructions.   The patient was advised to call back or seek an in-person evaluation if the symptoms worsen or if the condition fails to improve as anticipated.  The above assessment and management plan was discussed with the patient. The patient verbalized understanding of and has agreed to the management plan. Patient is aware to call the clinic if symptoms persist or worsen. Patient is aware when to return to the clinic for a follow-up visit. Patient educated on when it is appropriate to go to the emergency department.    I provided 15 minutes of non-face-to-face time during this encounter. The call started at 1605. The call ended at 1620.   Monia Pouch, FNP-C [facility] [address] [place], [place] [address] [phone]

## 2019-02-05 DIAGNOSIS — F319 Bipolar disorder, unspecified: Secondary | ICD-10-CM | POA: Diagnosis not present

## 2019-02-10 ENCOUNTER — Other Ambulatory Visit: Payer: Self-pay | Admitting: Pediatrics

## 2019-02-10 DIAGNOSIS — F329 Major depressive disorder, single episode, unspecified: Secondary | ICD-10-CM

## 2019-02-10 DIAGNOSIS — E119 Type 2 diabetes mellitus without complications: Secondary | ICD-10-CM

## 2019-02-10 DIAGNOSIS — F32A Depression, unspecified: Secondary | ICD-10-CM

## 2019-02-20 ENCOUNTER — Telehealth: Payer: Self-pay | Admitting: Family Medicine

## 2019-02-26 ENCOUNTER — Telehealth: Payer: Self-pay | Admitting: *Deleted

## 2019-02-26 NOTE — Telephone Encounter (Signed)
Can not do over the phone narcotic cough syrup. She can try over the counter Delsym.

## 2019-02-26 NOTE — Telephone Encounter (Signed)
She is dealing with serious bronchitis and can not come in to be seen. We are changing her 3 mo check up appt  To a televisit due to this. She is requesting cough syrup and and says that it should be called in because we will not let her in here. Refuses to go to ED

## 2019-02-26 NOTE — Telephone Encounter (Signed)
Pt aware = going to urgent care in the AM

## 2019-02-28 ENCOUNTER — Ambulatory Visit: Payer: Medicaid Other | Admitting: Family Medicine

## 2019-03-04 DIAGNOSIS — F319 Bipolar disorder, unspecified: Secondary | ICD-10-CM | POA: Diagnosis not present

## 2019-04-18 DIAGNOSIS — Z5181 Encounter for therapeutic drug level monitoring: Secondary | ICD-10-CM | POA: Diagnosis not present

## 2019-04-18 DIAGNOSIS — F411 Generalized anxiety disorder: Secondary | ICD-10-CM | POA: Diagnosis not present

## 2019-04-18 DIAGNOSIS — F900 Attention-deficit hyperactivity disorder, predominantly inattentive type: Secondary | ICD-10-CM | POA: Diagnosis not present

## 2019-04-18 DIAGNOSIS — F319 Bipolar disorder, unspecified: Secondary | ICD-10-CM | POA: Diagnosis not present

## 2019-04-18 DIAGNOSIS — F331 Major depressive disorder, recurrent, moderate: Secondary | ICD-10-CM | POA: Diagnosis not present

## 2019-04-30 ENCOUNTER — Other Ambulatory Visit: Payer: Self-pay | Admitting: Family Medicine

## 2019-04-30 NOTE — Telephone Encounter (Signed)
[place] last RFd 04/10/19

## 2019-05-01 NOTE — Telephone Encounter (Signed)
appt made - preferred to switch pcp to [facility]

## 2019-05-07 ENCOUNTER — Other Ambulatory Visit: Payer: Self-pay

## 2019-05-07 NOTE — Progress Notes (Signed)
Assessment & Plan:  1-3. Acute right-sided low back pain with right-sided sciatica/Sacroiliac pain/Fall down stairs, initial encounter - Encouraged to take Ibuprofen 400-600 mg with Tylenol 500 mg every 6-8 hours as needed for pain. Education provided on low back pain exercises.  - DG Sacrum/Coccyx; Future - DG Pelvis 1-2 Views; Future - DG Lumbar Spine 2-3 Views; Future  4. Diastasis recti - Patient feels much better regarding her "mass" after discussion. Education provided on diastasis recti. Encouraged exercises.   5. Diabetes mellitus without complication (HCC) Lab Results  Component Value Date   HGBA1C 7.8 (H) 11/28/2018   HGBA1C 7.1 (H) 08/17/2018   HGBA1C 6.7 03/02/2018   - Diabetes is at goal of A1c < 7 - 6.3 today. - Medications: continue current medications - Patient is not currently taking a statin. Patient is not taking an ACE-inhibitor/ARB.  - Last foot exam: 11/28/2018 - Last diabetic eye exam: never - Urine Microalbumin/Creat Ratio: today - Instruction/counseling given: reminded to get eye exam and discussed foot care - Bayer DCA Hb A1c Waived - CMP14+EGFR - Lipid panel - Microalbumin / creatinine urine ratio  6. Hypertension associated with diabetes ([place]) - Well controlled on current regimen.  - CMP14+EGFR - Lipid panel  7. Chronic obstructive pulmonary disease, unspecified COPD type ([place]) - Well controlled on current regimen.   8. OSA (obstructive sleep apnea) - Well controlled on current regimen.   9. Hyperlipidemia associated with type 2 diabetes mellitus ([place]) - Labs today as patient has not been taking her atorvastatin.  - CMP14+EGFR - Lipid panel  10. Upper airway cough syndrome - CMP14+EGFR  11. Encounter for hepatitis C screening test for low risk patient - Hepatitis C antibody  12. Screening for deficiency anemia - CBC with Differential/Platelet   Follow-up: Return in about 4 weeks (around 06/05/2019) for health maintenance .    [name], MSN, APRN, FNP-C [facility]  Subjective:  Patient ID: Kimberly Green, female    DOB: Aug 01, 1961  Age: 58 y.o. MRN: [id number]  Patient Care Team: Loman [place], FNP as PCP - General (Family Medicine)   CC:  Chief Complaint  Patient presents with  . Medical Management of Chronic Issues    HPI Kimberly Green presents for medication refills.   Substance use: none Last eye exam: never Last colonoscopy: never Last mammogram: 2018 Last pap smear: health department Hepatitis C Screening: never Immunizations:  Tdap Vaccine: UTD  Pneumonia Vaccine: to be given today  Sleep Apnea: she does wear a CPAP most nights.   COPD: only needs Albuterol once weekly. Patient only uses Flovent when she knows she is "going to be moving". Patient has tried spirometry testing multiple times but is unable to complete due to coughing.   Adderall is prescribed by her psychiatrist in [place]; she does not remember the name of the provider or the office. Kimberly Clan, PA per [facility])  Patient reports she stopped taking atorvastatin when her liver function returned elevated at her last appointment.   Cough: patient is taking gabapentin which has improved the cough by 90%.   Bipolar: takes Seroquel 200 mg QHS   DEPRESSION SCREENING PHQ 2/9 Scores 05/08/2019 11/28/2018 08/17/2018 03/02/2018 10/12/2017 07/19/2017 04/06/2017  PHQ - 2 Score _0 PHQ- 9 Score _1 Patient is concerned today regarding "a mass" and hip pain.   Epigastric Mass: patient reports she has a mass  that she can feel when she coughs. States she has had a CT scan done and was told she had an umbilical hernia. She is very upset by this as the mass she is referring to is not located at her umbilicus. The area does cause some slight discomfort when she is coughing. It is located in her middle abdomen between her sternum and umbilicus.   Pain: Patient reports she  fell 3 weeks ago down some steps when she tripped over her 86 year old grandson, and has since had pain in the right lower back/buttock area that does run down her right leg. Describes the pain as aching/spasms in her leg but stabbing in her buttocks. She rates the pain 10/10 with any movement and 5/10 if she is just sitting still. She has been taking Advil 400 mg every 3 hours which is effective.    Review of Systems  Constitutional: Negative for chills, fever, malaise/fatigue and weight loss.  HENT: Negative for congestion, ear discharge, ear pain, nosebleeds, sinus pain, sore throat and tinnitus.   Eyes: Negative for blurred vision, double vision, pain, discharge and redness.  Respiratory: Positive for cough. Negative for shortness of breath and wheezing.   Cardiovascular: Negative for chest pain, palpitations and leg swelling.  Gastrointestinal: Negative for abdominal pain, constipation, diarrhea, heartburn, nausea and vomiting.  Genitourinary: Negative for dysuria, frequency and urgency.  Musculoskeletal: Positive for back pain, falls and joint pain. Negative for myalgias.  Skin: Negative for rash.  Neurological: Negative for dizziness, seizures, weakness and headaches.  Psychiatric/Behavioral: Positive for depression. Negative for substance abuse and suicidal ideas. The patient is nervous/anxious.      Current Outpatient Medications:  .  albuterol (PROVENTIL HFA;VENTOLIN HFA) 108 (90 Base) MCG/ACT inhaler, Inhale 1-2 puffs into the lungs every 6 (six) hours as needed for wheezing or shortness of breath., Disp: 1 Inhaler, Rfl: 0 .  albuterol (PROVENTIL) (2.5 MG/3ML) 0.083% nebulizer solution, Take 3 mLs (2.5 mg total) by nebulization every 6 (six) hours as needed., Disp: 75 mL, Rfl: 2 .  amphetamine-dextroamphetamine (ADDERALL) 10 MG tablet, TAKE 1 TABLET BY MOUTH EVERY MORNING AND TAKE 1 TABLET BY MOUTH EVERY DAY AT NOON - MAY FILL IN JULY, Disp: , Rfl: 0 .  Blood Glucose Monitoring Suppl  (ACCU-CHEK AVIVA PLUS) w/Device KIT, Use to check BG bid, Disp: 1 kit, Rfl: 0 .  FARXIGA 10 MG TABS tablet, TAKE 1 TABLET BY MOUTH DAILY, Disp: 30 tablet, Rfl: 5 .  fluticasone (FLOVENT HFA) 110 MCG/ACT inhaler, Inhale 2 puffs into the lungs 2 (two) times daily., Disp: 1 Inhaler, Rfl: 12 .  gabapentin (NEURONTIN) 300 MG capsule, TAKE 1 CAPSULE BY MOUTH THREE TIMES DAILY, Disp: 90 capsule, Rfl: 1 .  glucose blood (ACCU-CHEK AVIVA PLUS) test strip, Use to check BG twice daily, Disp: 100 each, Rfl: 2 .  ibuprofen (ADVIL,MOTRIN) 200 MG tablet, Take 200 mg by mouth every 6 (six) hours as needed., Disp: , Rfl:  .  Lancets (ACCU-CHEK SOFT TOUCH) lancets, Use to check BG twice a day, Disp: 100 each, Rfl: 2 .  metFORMIN (GLUCOPHAGE) 1000 MG tablet, TAKE 1 TABLET BY MOUTH EVERY DAY WITH BREAKFAST, Disp: 90 tablet, Rfl: 0 .  QUEtiapine (SEROQUEL) 100 MG tablet, TAKE TWO TABLETS BY MOUTH AT BEDTIME, Disp: 60 tablet, Rfl: 2 .  atorvastatin (LIPITOR) 20 MG tablet, Take one tablet by mouth at 6pm daily (Patient not taking: Reported on 05/08/2019), Disp: 30 tablet, Rfl: 6  Allergies  Allergen Reactions  .  Lexiscan [Regadenoson] Shortness Of Breath    Severe transient respiratory distress    Past Medical History:  Diagnosis Date  . Adrenal tumor, benign    S/P L adrenal resection, 10/11 ([place])  . Bipolar disorder ([place])   . Cervical cancer ([place]) 1987  . Cholelithiasis   . COPD (chronic obstructive pulmonary disease) ([place])   . Diabetes mellitus without complication ([place])   . Essential hypertension, benign   . IBS (irritable bowel syndrome)   . Mixed hyperlipidemia   . PTSD (post-traumatic stress disorder)   . Renal carcinoma ([place])    S/P partial R nephrectomy (1/3 removed), 8/11 ([place])  . Sleep apnea     Past Surgical History:  Procedure Laterality Date  . ADRENAL GLAND SURGERY    . BACK SURGERY    . CERVICAL CONE BIOPSY     cervical cancer  . CHOLECYSTECTOMY    . KIDNEY MASS REMOVAL    . NECK  SURGERY    . RIGHT WRIST SURGERY    . SHOULDER SURGERY Left   . TUBAL LIGATION      Family History  Problem Relation Age of Onset  . Coronary artery disease Mother 49  . COPD Mother   . Hypertension Mother   . Breast cancer Mother   . Uterine cancer Mother   . Heart failure Father 60  . Heart disease Father   . Colon cancer Other        Aunt  . Lung cancer Other        Aunt  . Diabetes Sister   . Heart disease Sister   . Heart failure Maternal Grandmother   . Heart failure Paternal Grandmother   . Cerebral palsy Brother   . Early death Brother        MVA  . Diabetes Sister   . Heart disease Sister   . Valvular heart disease Sister   . Early death Sister        Murdered  . Uterine cancer Daughter     Social History   Socioeconomic History  . Marital status: Divorced    Spouse name: Not on file  . Number of children: 2  . Years of education: 37  . Highest education level: Not on file  Occupational History  . Not on file  Social Needs  . Financial resource strain: Not on file  . Food insecurity    Worry: Not on file    Inability: Not on file  . Transportation needs    Medical: Not on file    Non-medical: Not on file  Tobacco Use  . Smoking status: Current Every Day Smoker    Packs/day: 0.25    Years: 45.00    Pack years: 11.25    Types: Cigarettes  . Smokeless tobacco: Never Used  Substance and Sexual Activity  . Alcohol use: No  . Drug use: No  . Sexual activity: Not Currently  Lifestyle  . Physical activity    Days per week: Not on file    Minutes per session: Not on file  . Stress: Not on file  Relationships  . Social Herbalist on phone: Not on file    Gets together: Not on file    Attends religious service: Not on file    Active member of club or organization: Not on file    Attends meetings of clubs or organizations: Not on file    Relationship status: Not on file  . Intimate partner violence  Fear of current or ex partner:  Not on file    Emotionally abused: Not on file    Physically abused: Not on file    Forced sexual activity: Not on file  Other Topics Concern  . Not on file  Social History Narrative   Drinks 1 cup of coffee a day       Objective:    BP 118/61   Pulse 87   Temp 98.5 F (36.9 C) (Temporal)   Ht _0  (1.626 m)   Wt 247 lb (112 kg)   LMP 10/15/2015   BMI 42.40 kg/m   Wt Readings from Last 3 Encounters:  05/08/19 247 lb (112 kg)  11/28/18 249 lb (112.9 kg)  08/17/18 258 lb 6.4 oz (117.2 kg)    Physical Exam Vitals signs reviewed.  Constitutional:      General: She is not in acute distress.    Appearance: Normal appearance. She is morbidly obese. She is not ill-appearing, toxic-appearing or diaphoretic.  HENT:     Head: Normocephalic and atraumatic.  Eyes:     General: No scleral icterus.       Right eye: No discharge.        Left eye: No discharge.     Conjunctiva/sclera: Conjunctivae normal.  Neck:     Musculoskeletal: Normal range of motion.  Cardiovascular:     Rate and Rhythm: Normal rate and regular rhythm.     Heart sounds: Normal heart sounds. No murmur. No friction rub. No gallop.   Pulmonary:     Effort: Pulmonary effort is normal. No respiratory distress.     Breath sounds: Normal breath sounds. No stridor. No wheezing, rhonchi or rales.  Musculoskeletal: Normal range of motion.     Comments: Pain in the right sacroiliac joint. No hip pain with internal and external rotation of right hip.   Skin:    General: Skin is warm and dry.     Capillary Refill: Capillary refill takes less than 2 seconds.  Neurological:     General: No focal deficit present.     Mental Status: She is alert and oriented to person, place, and time. Mental status is at baseline.  Psychiatric:        Mood and Affect: Mood normal.        Behavior: Behavior normal.        Thought Content: Thought content normal.        Judgment: Judgment normal.     Lab Results  Component Value  Date   TSH 4.430 07/19/2017   Lab Results  Component Value Date   WBC 7.9 05/08/2019   HGB 15.0 05/08/2019   HCT 43.4 05/08/2019   MCV 85 05/08/2019   PLT 219 05/08/2019   Lab Results  Component Value Date   NA 145 (H) 05/08/2019   K 3.9 05/08/2019   CO2 23 05/08/2019   GLUCOSE 140 (H) 05/08/2019   BUN 7 05/08/2019   CREATININE 0.68 05/08/2019   BILITOT 0.2 05/08/2019   ALKPHOS 83 05/08/2019   AST 45 (H) 05/08/2019   ALT 60 (H) 05/08/2019   PROT 6.5 05/08/2019   ALBUMIN 4.0 05/08/2019   CALCIUM 9.4 05/08/2019   Lab Results  Component Value Date   CHOL 253 (H) 05/08/2019   Lab Results  Component Value Date   HDL 29 (L) 05/08/2019   Lab Results  Component Value Date   LDLCALC Comment 05/08/2019   Lab Results  Component Value Date   TRIG  473 (H) 05/08/2019   Lab Results  Component Value Date   CHOLHDL 8.7 (H) 05/08/2019   Lab Results  Component Value Date   HGBA1C 7.8 (H) 11/28/2018

## 2019-05-08 ENCOUNTER — Ambulatory Visit (INDEPENDENT_AMBULATORY_CARE_PROVIDER_SITE_OTHER): Payer: Medicaid Other | Admitting: Family Medicine

## 2019-05-08 ENCOUNTER — Encounter: Payer: Self-pay | Admitting: Family Medicine

## 2019-05-08 ENCOUNTER — Ambulatory Visit (HOSPITAL_COMMUNITY)
Admission: RE | Admit: 2019-05-08 | Discharge: 2019-05-08 | Disposition: A | Discharge status: Home / Self Care | Payer: Medicaid Other | Source: Ambulatory Visit | Attending: Family Medicine | Admitting: Family Medicine

## 2019-05-08 VITALS — BP 118/61 | HR 87 | Temp 98.5°F | Ht 64.0 in | Wt 247.0 lb

## 2019-05-08 DIAGNOSIS — E119 Type 2 diabetes mellitus without complications: Secondary | ICD-10-CM | POA: Diagnosis not present

## 2019-05-08 DIAGNOSIS — M533 Sacrococcygeal disorders, not elsewhere classified: Secondary | ICD-10-CM | POA: Diagnosis not present

## 2019-05-08 DIAGNOSIS — Z130 Encounter for screening for diseases of the blood and blood-forming organs and certain disorders involving the immune mechanism: Secondary | ICD-10-CM | POA: Diagnosis not present

## 2019-05-08 DIAGNOSIS — M5441 Lumbago with sciatica, right side: Secondary | ICD-10-CM | POA: Insufficient documentation

## 2019-05-08 DIAGNOSIS — J449 Chronic obstructive pulmonary disease, unspecified: Secondary | ICD-10-CM

## 2019-05-08 DIAGNOSIS — W108XXA Fall (on) (from) other stairs and steps, initial encounter: Secondary | ICD-10-CM | POA: Insufficient documentation

## 2019-05-08 DIAGNOSIS — R05 Cough: Secondary | ICD-10-CM

## 2019-05-08 DIAGNOSIS — Z23 Encounter for immunization: Secondary | ICD-10-CM | POA: Diagnosis not present

## 2019-05-08 DIAGNOSIS — M6208 Separation of muscle (nontraumatic), other site: Secondary | ICD-10-CM

## 2019-05-08 DIAGNOSIS — E1159 Type 2 diabetes mellitus with other circulatory complications: Secondary | ICD-10-CM | POA: Diagnosis not present

## 2019-05-08 DIAGNOSIS — G4733 Obstructive sleep apnea (adult) (pediatric): Secondary | ICD-10-CM

## 2019-05-08 DIAGNOSIS — R058 Other specified cough: Secondary | ICD-10-CM

## 2019-05-08 DIAGNOSIS — E1169 Type 2 diabetes mellitus with other specified complication: Secondary | ICD-10-CM

## 2019-05-08 DIAGNOSIS — I10 Essential (primary) hypertension: Secondary | ICD-10-CM

## 2019-05-08 DIAGNOSIS — Z1159 Encounter for screening for other viral diseases: Secondary | ICD-10-CM | POA: Diagnosis not present

## 2019-05-08 DIAGNOSIS — E785 Hyperlipidemia, unspecified: Secondary | ICD-10-CM

## 2019-05-08 DIAGNOSIS — I152 Hypertension secondary to endocrine disorders: Secondary | ICD-10-CM

## 2019-05-08 DIAGNOSIS — M545 Low back pain: Secondary | ICD-10-CM | POA: Diagnosis not present

## 2019-05-08 NOTE — Patient Instructions (Addendum)
Schedule your pap smear with the health department!!!  Diastasis Recti  Diastasis recti is when the muscles of the abdomen (rectus abdominis muscles) become thin and separate. The result is a wider space between the right and left abdomen (abdominal) muscles. This wider space between the muscles may cause a bulge in the middle of your abdomen. You may notice this bulge when you are straining or when you sit up from a lying down position. Diastasis recti can affect men and women. It is most common among pregnant women, infants, people who are obese, and people who have had abdominal surgery. Exercise or surgical treatment may help correct it. What are the causes? Common causes of this condition include:  Pregnancy. The growing uterus puts pressure on the abdominal muscles, which causes the muscles to separate.  Obesity. Excess fat puts pressure on abdominal muscles.  Weightlifting.  Some abdomen exercises.  Advanced age.  Genetics.  Prior abdominal surgery. What increases the risk? This condition is more likely to develop in:  Women.  Newborns, especially newborns who are born early (prematurely). What are the signs or symptoms? Common symptoms of this condition include:  A bulge in the middle of the abdomen. You will notice it most when you sit up or strain.  Pain in the low back, pelvis, or hips.  Constipation.  Inability to control when you urinate (urinary incontinence).  Bloating.  Poor posture. How is this diagnosed? This condition is diagnosed with a physical exam. Your health care provider will ask you to lie flat on your back and do a crunch or half sit-up. If you have diastasis recti, a vertical bulge will appear between your abdominal muscles in the center of your abdomen. Your health care provider will measure the gap between your muscles with one of the following:  A medical device used to measure the space between two objects (caliper).  A tape measure.   CT scan.  Ultrasound.  Finger spaces. Your health care provider will measure the space using their fingers. How is this treated? If your muscle separation is not too large, you may not need treatment. However, if you are a woman who plans to become pregnant again, you should treat this condition before your next pregnancy. Treatment may include:  Physical therapy to strengthen and tighten your abdominal muscles.  Lifestyle changes such as weight loss and exercise.  Over-the-counter pain medicines as needed.  Surgery to correct the separation. Follow these instructions at home: Activity  Return to your normal activities as told by your health care provider. Ask your health care provider what activities are safe for you.  When lifting weights or doing exercises using your abdominal muscles or the muscles in the center of your body that give stability (core muscles), make sure you are doing your exercises and movements correctly. Proper form can help to prevent the condition from happening again. General instructions  If you are overweight, ask your health care provider for help with weight loss. Losing even a small amount of weight can help to improve your diastasis recti.  Take over-the-counter or prescription medicines only as told by your health care provider.  Do not strain. Straining can make the separation worse. Examples of straining include: ? Pushing hard to have a bowel movement, such as due to constipation. ? Lifting heavy objects, including children. ? Standing up and sitting down.  Take steps to prevent constipation: ? Drink enough fluid to keep your urine clear or pale yellow. ? Take over-the-counter  or prescription medicines only as directed. ? Eat foods that are high in fiber, such as fresh fruits and vegetables, whole grains, and beans. ? Limit foods that are high in fat and processed sugars, such as fried and sweet foods. Contact a health care provider if:  You  notice a new bulge in your abdomen. Get help right away if:  You experience severe discomfort in your abdomen.  You develop severe abdominal pain along with nausea, vomiting, or fever. Summary  Diastasis recti is when the abdomen (abdominal) muscles become thin and separate. Your abdomen will stick out because the space between your right and left abdomen muscles has widened.  The most common symptom is a bulge in your abdomen. You will notice it most when you sit up or are straining.  This condition is diagnosed during a physical exam.  If the abdomen separation is not too big, you may choose not to have treatment. Otherwise, you may need to undergo physical therapy or surgery. This information is not intended to replace advice given to you by your health care provider. Make sure you discuss any questions you have with your health care provider. Document Released: 11/21/2016 Document Revised: 09/08/2017 Document Reviewed: 11/21/2016 Elsevier Patient Education  2020 [place] or Strain Rehab Ask your health care provider which exercises are safe for you. Do exercises exactly as told by your health care provider and adjust them as directed. It is normal to feel mild stretching, pulling, tightness, or discomfort as you do these exercises. Stop right away if you feel sudden pain or your pain gets worse. Do not begin these exercises until told by your health care provider. Stretching and range-of-motion exercises These exercises warm up your muscles and joints and improve the movement and flexibility of your back. These exercises also help to relieve pain, numbness, and tingling. Lumbar rotation  1. Lie on your back on a firm surface and bend your knees. 2. Straighten your arms out to your sides so each arm forms a 90-degree angle (right angle) with a side of your body. 3. Slowly move (rotate) both of your knees to one side of your body until you feel a stretch in your  lower back (lumbar). Try not to let your shoulders lift off the floor. 4. Hold this position for __________ seconds. 5. Tense your abdominal muscles and slowly move your knees back to the starting position. 6. Repeat this exercise on the other side of your body. Repeat __________ times. Complete this exercise __________ times a day. Single knee to chest  1. Lie on your back on a firm surface with both legs straight. 2. Bend one of your knees. Use your hands to move your knee up toward your chest until you feel a gentle stretch in your lower back and buttock. ? Hold your leg in this position by holding on to the front of your knee. ? Keep your other leg as straight as possible. 3. Hold this position for __________ seconds. 4. Slowly return to the starting position. 5. Repeat with your other leg. Repeat __________ times. Complete this exercise __________ times a day. Prone extension on elbows  1. Lie on your abdomen on a firm surface (prone position). 2. Prop yourself up on your elbows. 3. Use your arms to help lift your chest up until you feel a gentle stretch in your abdomen and your lower back. ? This will place some of your body weight on your elbows. If  this is uncomfortable, try stacking pillows under your chest. ? Your hips should stay down, against the surface that you are lying on. Keep your hip and back muscles relaxed. 4. Hold this position for __________ seconds. 5. Slowly relax your upper body and return to the starting position. Repeat __________ times. Complete this exercise __________ times a day. Strengthening exercises These exercises build strength and endurance in your back. Endurance is the ability to use your muscles for a long time, even after they get tired. Pelvic tilt This exercise strengthens the muscles that lie deep in the abdomen. 1. Lie on your back on a firm surface. Bend your knees and keep your feet flat on the floor. 2. Tense your abdominal muscles. Tip  your pelvis up toward the ceiling and flatten your lower back into the floor. ? To help with this exercise, you may place a small towel under your lower back and try to push your back into the towel. 3. Hold this position for __________ seconds. 4. Let your muscles relax completely before you repeat this exercise. Repeat __________ times. Complete this exercise __________ times a day. Alternating arm and leg raises  1. Get on your hands and knees on a firm surface. If you are on a hard floor, you may want to use padding, such as an exercise mat, to cushion your knees. 2. Line up your arms and legs. Your hands should be directly below your shoulders, and your knees should be directly below your hips. 3. Lift your left leg behind you. At the same time, raise your right arm and straighten it in front of you. ? Do not lift your leg higher than your hip. ? Do not lift your arm higher than your shoulder. ? Keep your abdominal and back muscles tight. ? Keep your hips facing the ground. ? Do not arch your back. ? Keep your balance carefully, and do not hold your breath. 4. Hold this position for __________ seconds. 5. Slowly return to the starting position. 6. Repeat with your right leg and your left arm. Repeat __________ times. Complete this exercise __________ times a day. Abdominal set with straight leg raise  1. Lie on your back on a firm surface. 2. Bend one of your knees and keep your other leg straight. 3. Tense your abdominal muscles and lift your straight leg up, 4-6 inches (10-15 cm) off the ground. 4. Keep your abdominal muscles tight and hold this position for __________ seconds. ? Do not hold your breath. ? Do not arch your back. Keep it flat against the ground. 5. Keep your abdominal muscles tense as you slowly lower your leg back to the starting position. 6. Repeat with your other leg. Repeat __________ times. Complete this exercise __________ times a day. Single leg lower with  bent knees 1. Lie on your back on a firm surface. 2. Tense your abdominal muscles and lift your feet off the floor, one foot at a time, so your knees and hips are bent in 90-degree angles (right angles). ? Your knees should be over your hips and your lower legs should be parallel to the floor. 3. Keeping your abdominal muscles tense and your knee bent, slowly lower one of your legs so your toe touches the ground. 4. Lift your leg back up to return to the starting position. ? Do not hold your breath. ? Do not let your back arch. Keep your back flat against the ground. 5. Repeat with your other leg. Repeat __________  times. Complete this exercise __________ times a day. Posture and body mechanics Good posture and healthy body mechanics can help to relieve stress in your body's tissues and joints. Body mechanics refers to the movements and positions of your body while you do your daily activities. Posture is part of body mechanics. Good posture means:  Your spine is in its natural S-curve position (neutral).  Your shoulders are pulled back slightly.  Your head is not tipped forward. Follow these guidelines to improve your posture and body mechanics in your everyday activities. Standing   When standing, keep your spine neutral and your feet about hip width apart. Keep a slight bend in your knees. Your ears, shoulders, and hips should line up.  When you do a task in which you stand in one place for a long time, place one foot up on a stable object that is 2-4 inches (5-10 cm) high, such as a footstool. This helps keep your spine neutral. Sitting   When sitting, keep your spine neutral and keep your feet flat on the floor. Use a footrest, if necessary, and keep your thighs parallel to the floor. Avoid rounding your shoulders, and avoid tilting your head forward.  When working at a desk or a computer, keep your desk at a height where your hands are slightly lower than your elbows. Slide your  chair under your desk so you are close enough to maintain good posture.  When working at a computer, place your monitor at a height where you are looking straight ahead and you do not have to tilt your head forward or downward to look at the screen. Resting  When lying down and resting, avoid positions that are most painful for you.  If you have pain with activities such as sitting, bending, stooping, or squatting, lie in a position in which your body does not bend very much. For example, avoid curling up on your side with your arms and knees near your chest (fetal position).  If you have pain with activities such as standing for a long time or reaching with your arms, lie with your spine in a neutral position and bend your knees slightly. Try the following positions: ? Lying on your side with a pillow between your knees. ? Lying on your back with a pillow under your knees. Lifting   When lifting objects, keep your feet at least shoulder width apart and tighten your abdominal muscles.  Bend your knees and hips and keep your spine neutral. It is important to lift using the strength of your legs, not your back. Do not lock your knees straight out.  Always ask for help to lift heavy or awkward objects. This information is not intended to replace advice given to you by your health care provider. Make sure you discuss any questions you have with your health care provider. Document Released: 09/26/2005 Document Revised: 01/18/2019 Document Reviewed: 10/18/2018 Elsevier Patient Education  2020 [employer].

## 2019-05-09 LAB — CMP14+EGFR
ALT: 60 IU/L — ABNORMAL HIGH (ref 0–32)
AST: 45 IU/L — ABNORMAL HIGH (ref 0–40)
Albumin/Globulin Ratio: 1.6 (ref 1.2–2.2)
Albumin: 4 g/dL (ref 3.8–4.9)
Alkaline Phosphatase: 83 IU/L (ref 39–117)
BUN/Creatinine Ratio: 10 (ref 9–23)
BUN: 7 mg/dL (ref 6–24)
Bilirubin Total: 0.2 mg/dL (ref 0.0–1.2)
CO2: 23 mmol/L (ref 20–29)
Calcium: 9.4 mg/dL (ref 8.7–10.2)
Chloride: 105 mmol/L (ref 96–106)
Creatinine, Ser: 0.68 mg/dL (ref 0.57–1.00)
GFR calc Af Amer: 113 mL/min/{1.73_m2} (ref >59)
GFR calc non Af Amer: 98 mL/min/{1.73_m2} (ref >59)
Globulin, Total: 2.5 g/dL (ref 1.5–4.5)
Glucose: 140 mg/dL — ABNORMAL HIGH (ref 65–99)
Potassium: 3.9 mmol/L (ref 3.5–5.2)
Sodium: 145 mmol/L — ABNORMAL HIGH (ref 134–144)
Total Protein: 6.5 g/dL (ref 6.0–8.5)

## 2019-05-09 LAB — CBC WITH DIFFERENTIAL/PLATELET
Basophils Absolute: 0.1 10*3/uL (ref 0.0–0.2)
Basos: 1 %
EOS (ABSOLUTE): 0.4 10*3/uL (ref 0.0–0.4)
Eos: 6 %
Hematocrit: 43.4 % (ref 34.0–46.6)
Hemoglobin: 15 g/dL (ref 11.1–15.9)
Immature Grans (Abs): 0 10*3/uL (ref 0.0–0.1)
Immature Granulocytes: 1 %
Lymphocytes Absolute: 2.4 10*3/uL (ref 0.7–3.1)
Lymphs: 30 %
MCH: 29.3 pg (ref 26.6–33.0)
MCHC: 34.6 g/dL (ref 31.5–35.7)
MCV: 85 fL (ref 79–97)
Monocytes Absolute: 0.5 10*3/uL (ref 0.1–0.9)
Monocytes: 7 %
Neutrophils Absolute: 4.4 10*3/uL (ref 1.4–7.0)
Neutrophils: 55 %
Platelets: 219 10*3/uL (ref 150–450)
RBC: 5.12 x10E6/uL (ref 3.77–5.28)
RDW: 13.2 % (ref 11.7–15.4)
WBC: 7.9 10*3/uL (ref 3.4–10.8)

## 2019-05-09 LAB — MICROALBUMIN / CREATININE URINE RATIO
Creatinine, Urine: 58 mg/dL
Microalb/Creat Ratio: 10 mg/g creat (ref 0–29)
Microalbumin, Urine: 5.6 ug/mL

## 2019-05-09 LAB — LIPID PANEL
Chol/HDL Ratio: 8.7 ratio — ABNORMAL HIGH (ref 0.0–4.4)
Cholesterol, Total: 253 mg/dL — ABNORMAL HIGH (ref 100–199)
HDL: 29 mg/dL — ABNORMAL LOW (ref >39)
Triglycerides: 473 mg/dL — ABNORMAL HIGH (ref 0–149)

## 2019-05-09 LAB — HEPATITIS C ANTIBODY: Hep C Virus Ab: <0.1 s/co ratio (ref 0.0–0.9)

## 2019-05-09 LAB — SPECIMEN STATUS REPORT

## 2019-05-10 ENCOUNTER — Telehealth: Payer: Self-pay | Admitting: Family Medicine

## 2019-05-10 ENCOUNTER — Other Ambulatory Visit: Payer: Self-pay | Admitting: Family Medicine

## 2019-05-10 DIAGNOSIS — R748 Abnormal levels of other serum enzymes: Secondary | ICD-10-CM

## 2019-05-10 NOTE — Telephone Encounter (Signed)
Patient aware.

## 2019-05-10 NOTE — Telephone Encounter (Signed)
Patient is wanting to speak with Kimberly Green herself not a nurse. Please advise.

## 2019-05-10 NOTE — Telephone Encounter (Signed)
If the pain is that unbearable I suggest going to the ER.

## 2019-05-14 ENCOUNTER — Ambulatory Visit: Payer: Medicaid Other | Admitting: Family Medicine

## 2019-05-16 ENCOUNTER — Other Ambulatory Visit: Payer: Self-pay

## 2019-05-16 ENCOUNTER — Emergency Department (HOSPITAL_COMMUNITY)
Admission: EM | Admit: 2019-05-16 | Discharge: 2019-05-16 | Disposition: A | Discharge status: Home / Self Care | Payer: Medicaid Other | Attending: Emergency Medicine | Admitting: Emergency Medicine

## 2019-05-16 ENCOUNTER — Encounter (HOSPITAL_COMMUNITY): Payer: Self-pay | Admitting: Emergency Medicine

## 2019-05-16 DIAGNOSIS — M5431 Sciatica, right side: Secondary | ICD-10-CM | POA: Diagnosis not present

## 2019-05-16 DIAGNOSIS — E119 Type 2 diabetes mellitus without complications: Secondary | ICD-10-CM | POA: Diagnosis not present

## 2019-05-16 DIAGNOSIS — Z79899 Other long term (current) drug therapy: Secondary | ICD-10-CM | POA: Diagnosis not present

## 2019-05-16 DIAGNOSIS — F17210 Nicotine dependence, cigarettes, uncomplicated: Secondary | ICD-10-CM | POA: Diagnosis not present

## 2019-05-16 DIAGNOSIS — Z85528 Personal history of other malignant neoplasm of kidney: Secondary | ICD-10-CM | POA: Insufficient documentation

## 2019-05-16 DIAGNOSIS — Z7984 Long term (current) use of oral hypoglycemic drugs: Secondary | ICD-10-CM | POA: Insufficient documentation

## 2019-05-16 DIAGNOSIS — F319 Bipolar disorder, unspecified: Secondary | ICD-10-CM | POA: Diagnosis not present

## 2019-05-16 DIAGNOSIS — M5441 Lumbago with sciatica, right side: Secondary | ICD-10-CM | POA: Diagnosis not present

## 2019-05-16 DIAGNOSIS — M7918 Myalgia, other site: Secondary | ICD-10-CM | POA: Diagnosis present

## 2019-05-16 DIAGNOSIS — I10 Essential (primary) hypertension: Secondary | ICD-10-CM | POA: Diagnosis not present

## 2019-05-16 MED ORDER — BACLOFEN 10 MG PO TABS: 10.0000 mg | ORAL_TABLET | Freq: Three times a day (TID) | ORAL | 0 refills | Status: DC

## 2019-05-16 MED ORDER — CELECOXIB 200 MG PO CAPS: 200.0000 mg | ORAL_CAPSULE | Freq: Two times a day (BID) | ORAL | 0 refills | Status: DC

## 2019-05-16 MED ORDER — METHYLPREDNISOLONE 4 MG PO TBPK: ORAL_TABLET | ORAL | 0 refills | Status: DC

## 2019-05-16 MED ORDER — TRAMADOL HCL 50 MG PO TABS: 50.0000 mg | ORAL_TABLET | Freq: Four times a day (QID) | ORAL | 0 refills | Status: DC | PRN

## 2019-05-16 NOTE — ED Triage Notes (Signed)
Pain to RT buttock running down RT leg, since early July .  Was seen by pcp had neg xrays.  Called pcp to get muscle relaxer or pain med and was told to come to ed.

## 2019-05-16 NOTE — ED Provider Notes (Signed)
[facility] EMERGENCY DEPARTMENT Provider Note   CSN: [id number] Arrival date & time: 05/16/19  1315     History   Chief Complaint Chief Complaint  Patient presents with  . Kimberly Green    HPI Kimberly Green is a 58 y.o. female who presents emergency department with chief complaint of right buttock and leg Green.  First week of July the patient fell down about 4 steps sliding down her bottom.  Since that time she has had sensation of Green and swelling in the right gluteal tissue.  She points to the piriformis distribution.  She complains of a deep aching Green running down the back of her leg and on the lateral calf.  She denies any weakness numbness or tingling.  She denies foot drop, saddle anesthesia or loss of bowel or bladder control.  She is never had any Green like this before.  She has a previous history of L4-L5 surgery from occult back fracture.  She has had some bruising over the area which has improved.  She saw her PCP via telehealth about a week ago and had an x-ray that showed no fractures of the lumbar or sacrum region.     HPI  Past Medical History:  Diagnosis Date  . Adrenal tumor, benign    S/P L adrenal resection, 10/11 ([place])  . Bipolar disorder ([place])   . Cervical cancer ([place]) 1987  . Cholelithiasis   . COPD (chronic obstructive pulmonary disease) ([place])   . Diabetes mellitus without complication ([place])   . Essential hypertension, benign   . IBS (irritable bowel syndrome)   . Mixed hyperlipidemia   . PTSD (post-traumatic stress disorder)   . Renal carcinoma ([place])    S/P partial R nephrectomy (1/3 removed), 8/11 ([place])  . Sleep apnea     Patient Active Problem List   Diagnosis Date Noted  . Diabetes mellitus without complication ([place]) 29/51/8841  . Trigger middle finger of left hand 11/28/2018  . Upper airway cough syndrome 03/16/2018  . Cigarette smoker 03/16/2018  . OSA (obstructive sleep apnea) 03/02/2018  . COPD (chronic obstructive pulmonary  disease) ([place]) 11/17/2016  . Depression, recurrent ([place]) 11/17/2016  . History of renal cell cancer 11/17/2016  . Hyperlipidemia associated with type 2 diabetes mellitus ([place]) 11/17/2016  . Hypertension associated with diabetes ([place]) 11/17/2016    Past Surgical History:  Procedure Laterality Date  . ADRENAL GLAND SURGERY    . BACK SURGERY    . CERVICAL CONE BIOPSY     cervical cancer  . CHOLECYSTECTOMY    . KIDNEY MASS REMOVAL    . NECK SURGERY    . RIGHT WRIST SURGERY    . SHOULDER SURGERY Left   . TUBAL LIGATION       OB History   No obstetric history on file.      Home Medications    Prior to Admission medications   Medication Sig Start Date End Date Taking? Authorizing Provider  albuterol (PROVENTIL HFA;VENTOLIN HFA) 108 (90 Base) MCG/ACT inhaler Inhale 1-2 puffs into the lungs every 6 (six) hours as needed for wheezing or shortness of breath. 08/17/18   Eustaquio Maize, MD  albuterol (PROVENTIL) (2.5 MG/3ML) 0.083% nebulizer solution Take 3 mLs (2.5 mg total) by nebulization every 6 (six) hours as needed. 08/17/18   Eustaquio Maize, MD  amphetamine-dextroamphetamine (ADDERALL) 10 MG tablet TAKE 1 TABLET BY MOUTH EVERY MORNING AND TAKE 1 TABLET BY MOUTH EVERY DAY AT NOON - MAY [place]  04/27/17   [provider]  atorvastatin (LIPITOR) 20 MG tablet Take one tablet by mouth at 6pm daily Patient not taking: Reported on 05/08/2019 08/17/18   Eustaquio Maize, MD  Blood Glucose Monitoring Suppl (ACCU-CHEK AVIVA PLUS) w/Device KIT Use to check BG bid 11/25/16   Eustaquio Maize, MD  FARXIGA 10 MG TABS tablet TAKE 1 TABLET BY MOUTH DAILY 02/11/19   Baruch Gouty, FNP  fluticasone (FLOVENT HFA) 110 MCG/ACT inhaler Inhale 2 puffs into the lungs 2 (two) times daily. 08/17/18   Eustaquio Maize, MD  gabapentin (NEURONTIN) 300 MG capsule TAKE 1 CAPSULE BY MOUTH THREE TIMES DAILY 11/30/18   Baruch Gouty, FNP  glucose blood (ACCU-CHEK AVIVA PLUS) test strip Use to check BG  twice daily 11/25/16   Eustaquio Maize, MD  ibuprofen (ADVIL,MOTRIN) 200 MG tablet Take 200 mg by mouth every 6 (six) hours as needed.    [provider]  Lancets (ACCU-CHEK SOFT TOUCH) lancets Use to check BG twice a day 11/25/16   Eustaquio Maize, MD  metFORMIN (GLUCOPHAGE) 1000 MG tablet TAKE 1 TABLET BY MOUTH EVERY DAY WITH BREAKFAST 01/17/19   Baruch Gouty, FNP  QUEtiapine (SEROQUEL) 100 MG tablet TAKE TWO TABLETS BY MOUTH AT BEDTIME 02/11/19   Rakes, Connye Burkitt, FNP    Family History Family History  Problem Relation Age of Onset  . Coronary artery disease Mother 7  . COPD Mother   . Hypertension Mother   . Breast cancer Mother   . Uterine cancer Mother   . Heart failure Father 60  . Heart disease Father   . Colon cancer Other        Aunt  . Lung cancer Other        Aunt  . Diabetes Sister   . Heart disease Sister   . Heart failure Maternal Grandmother   . Heart failure Paternal Grandmother   . Cerebral palsy Brother   . Early death Brother        MVA  . Diabetes Sister   . Heart disease Sister   . Valvular heart disease Sister   . Early death Sister        Murdered  . Uterine cancer Daughter     Social History Social History   Tobacco Use  . Smoking status: Current Every Day Smoker    Packs/day: 0.25    Years: 45.00    Pack years: 11.25    Types: Cigarettes  . Smokeless tobacco: Never Used  Substance Use Topics  . Alcohol use: No  . Drug use: No     Allergies   Lexiscan [regadenoson]   Review of Systems Review of Systems  Ten systems reviewed and are negative for acute change, except as noted in the HPI.   Physical Exam Updated Vital Signs BP 140/65 (BP Location: Left Arm)   Pulse 97   Temp 98.1 F (36.7 C) (Temporal)   Resp 19   Ht '5\' 4"'$  (1.626 m)   Wt 109.8 kg   LMP 10/15/2015   SpO2 96%   BMI 41.54 kg/m   Physical Exam Vitals signs and nursing note reviewed.  Constitutional:      General: She is not in acute distress.     Appearance: She is well-developed. She is not diaphoretic.  HENT:     Head: Normocephalic and atraumatic.  Eyes:     General: No scleral icterus.    Conjunctiva/sclera: Conjunctivae normal.  Neck:  Musculoskeletal: Normal range of motion.  Cardiovascular:     Rate and Rhythm: Normal rate and regular rhythm.     Heart sounds: Normal heart sounds. No murmur. No friction rub. No gallop.   Pulmonary:     Effort: Pulmonary effort is normal. No respiratory distress.     Breath sounds: Normal breath sounds.  Abdominal:     General: Bowel sounds are normal. There is no distension.     Palpations: Abdomen is soft. There is no mass.     Tenderness: There is no abdominal tenderness. There is no guarding.  Musculoskeletal:     Comments: Patient appears to be in mild to moderate Green, antalgic gait noted. Lumbosacral spine area reveals no local tenderness or mass. No obvious bruising.Straight leg raise is positive at 30 degrees on right. DTR's, motor strength and sensation normal, including heel and toe gait.  Peripheral pulses are palpable.   Skin:    General: Skin is warm and dry.  Neurological:     Mental Status: She is alert and oriented to person, place, and time.  Psychiatric:        Behavior: Behavior normal.      ED Treatments / Results  Labs (all labs ordered are listed, but only abnormal results are displayed) Labs Reviewed - No data to display  EKG None  Radiology No results found.  Procedures Procedures (including critical care time)  Medications Ordered in ED Medications - No data to display   Initial Impression / Assessment and Plan / ED Course  I have reviewed the triage vital signs and the nursing notes.  Pertinent labs & imaging results that were available during my care of the patient were reviewed by me and considered in my medical decision making (see chart for details).        Patient with recent Kimberly injury.  She is presenting today with  symptoms of sciatica, no red flag symptoms.  Differential does include true radiculopathy however given her injury she could have a deep hematoma or inflammatory process causing a pseudo-sciatica or piriformis syndrome.  Patient will be treated traditionally with steroids, anti-inflammatory and muscle relaxer.  I personally reviewed the patient's PDMP.  Final Clinical Impressions(s) / ED Diagnoses   Final diagnoses:  Sciatica of right side    ED Discharge Orders    None       Margarita Mail, PA-C 05/16/19 1455    Virgel Manifold, MD 05/17/19 [phone]

## 2019-05-16 NOTE — Discharge Instructions (Signed)
SEEK IMMEDIATE MEDICAL ATTENTION IF: New numbness, tingling, weakness, or problem with the use of your arms or legs.  Severe back pain not relieved with medications.  Change in bowel or bladder control.  Increasing pain in any areas of the body (such as chest or abdominal pain).  Shortness of breath, dizziness or fainting.  Nausea (feeling sick to your stomach), vomiting, fever, or sweats.  

## 2019-05-21 ENCOUNTER — Other Ambulatory Visit: Payer: Self-pay | Admitting: Family Medicine

## 2019-06-06 ENCOUNTER — Other Ambulatory Visit: Payer: Self-pay | Admitting: Family Medicine

## 2019-06-06 ENCOUNTER — Ambulatory Visit: Payer: Medicaid Other | Admitting: Family Medicine

## 2019-06-06 DIAGNOSIS — F329 Major depressive disorder, single episode, unspecified: Secondary | ICD-10-CM

## 2019-06-06 DIAGNOSIS — F32A Depression, unspecified: Secondary | ICD-10-CM

## 2019-06-12 ENCOUNTER — Emergency Department (HOSPITAL_COMMUNITY)
Admission: EM | Admit: 2019-06-12 | Discharge: 2019-06-12 | Disposition: A | Discharge status: Home / Self Care | Payer: Medicaid Other | Attending: Emergency Medicine | Admitting: Emergency Medicine

## 2019-06-12 ENCOUNTER — Encounter (HOSPITAL_COMMUNITY): Payer: Self-pay | Admitting: Emergency Medicine

## 2019-06-12 ENCOUNTER — Other Ambulatory Visit: Payer: Self-pay

## 2019-06-12 DIAGNOSIS — I10 Essential (primary) hypertension: Secondary | ICD-10-CM | POA: Diagnosis not present

## 2019-06-12 DIAGNOSIS — F17210 Nicotine dependence, cigarettes, uncomplicated: Secondary | ICD-10-CM | POA: Diagnosis not present

## 2019-06-12 DIAGNOSIS — Z7984 Long term (current) use of oral hypoglycemic drugs: Secondary | ICD-10-CM | POA: Insufficient documentation

## 2019-06-12 DIAGNOSIS — M5441 Lumbago with sciatica, right side: Secondary | ICD-10-CM | POA: Diagnosis not present

## 2019-06-12 DIAGNOSIS — R52 Pain, unspecified: Secondary | ICD-10-CM | POA: Diagnosis not present

## 2019-06-12 DIAGNOSIS — Z79899 Other long term (current) drug therapy: Secondary | ICD-10-CM | POA: Diagnosis not present

## 2019-06-12 DIAGNOSIS — J449 Chronic obstructive pulmonary disease, unspecified: Secondary | ICD-10-CM | POA: Insufficient documentation

## 2019-06-12 DIAGNOSIS — E119 Type 2 diabetes mellitus without complications: Secondary | ICD-10-CM | POA: Diagnosis not present

## 2019-06-12 DIAGNOSIS — M545 Low back pain: Secondary | ICD-10-CM | POA: Diagnosis present

## 2019-06-12 DIAGNOSIS — W19XXXA Unspecified fall, initial encounter: Secondary | ICD-10-CM | POA: Diagnosis not present

## 2019-06-12 DIAGNOSIS — M5431 Sciatica, right side: Secondary | ICD-10-CM

## 2019-06-12 MED ORDER — METHYLPREDNISOLONE 4 MG PO TBPK: ORAL_TABLET | ORAL | 0 refills | Status: DC

## 2019-06-12 MED ORDER — METHOCARBAMOL 500 MG PO TABS: 500.0000 mg | ORAL_TABLET | Freq: Two times a day (BID) | ORAL | 0 refills | Status: DC | PRN

## 2019-06-12 MED ORDER — TRAMADOL HCL 50 MG PO TABS: 50.0000 mg | ORAL_TABLET | Freq: Four times a day (QID) | ORAL | 0 refills | Status: DC | PRN

## 2019-06-12 MED ORDER — CELECOXIB 200 MG PO CAPS: 200.0000 mg | ORAL_CAPSULE | Freq: Two times a day (BID) | ORAL | 0 refills | Status: DC

## 2019-06-12 MED ORDER — OXYCODONE-ACETAMINOPHEN 5-325 MG PO TABS
1.0000 | ORAL_TABLET | Freq: Once | ORAL | Status: AC
  Administered 2019-06-12: 1 via ORAL
  Filled 2019-06-12: qty 1

## 2019-06-12 NOTE — Discharge Instructions (Signed)
Please read instructions below.  You can take celebrex every 12 hours as needed for pain.   Apply ice to your back for 20 minutes at a time.  You can also apply heat if this provides more relief.   You can take robaxin every 12 hours as needed for muscle spasm.  Be aware this medication can make you drowsy; do not take while driving or drinking alcohol.   Starting today, take the steroid pack as prescribed. Check your blood sugars daily as this can cause them to rise. Follow-up with your primary care provider regarding your visit today. Return to ER if new numbness or tingling in your arms or legs, inability to urinate, inability to hold your bowels, or weakness in your extremities.

## 2019-06-12 NOTE — ED Provider Notes (Signed)
[facility] EMERGENCY DEPARTMENT Provider Note   CSN: [id number] Arrival date & time: 06/12/19  1311     History   Chief Complaint Chief Complaint  Patient presents with   Back Pain    HPI Kimberly Green is a 58 y.o. female with past medical history of COPD, hypertension, type 2 diabetes, presenting to the emergency department with complaint of a flare of her right sided low back pain attributed to sciatica.  She states she had the same pain when she was evaluated in the beginning of August.  Per chart review, she was evaluated on 05/16/2019 with similar symptoms and treated with symptomatic management with improvement.  She reports 3 days ago she developed recurrence of symptoms after she rolled over in bed.  Her pain is located to the right gluteal region and radiates down her posterior thigh.  She denies any new bowel or bladder incontinence, saddle paresthesia, abdominal pain, urinary symptoms, fever.  She states sometimes when she steps down she has shooting pain which causes her leg to give out however she is not having any weakness that is preventing her from walking.  Her symptoms are worse with sitting and standing.  She attempted to get an appointment with her old back surgeon after surgery on L4-L5 for medical back fracture, however this is done back in 1995 and he is not currently accepting patients.     The history is provided by the patient.    Past Medical History:  Diagnosis Date   Adrenal tumor, benign    S/P L adrenal resection, 10/11 ([facility])   Bipolar disorder ([place])    Cervical cancer ([place]) 1987   Cholelithiasis    COPD (chronic obstructive pulmonary disease) ([place])    Diabetes mellitus without complication ([place])    Essential hypertension, benign    IBS (irritable bowel syndrome)    Mixed hyperlipidemia    PTSD (post-traumatic stress disorder)    Renal carcinoma (HCC)    S/P partial R nephrectomy (1/3 removed), 8/11 [facility])   Sleep apnea      Patient Active Problem List   Diagnosis Date Noted   Diabetes mellitus without complication ([place]) 37/07/6268   Trigger middle finger of left hand 11/28/2018   Upper airway cough syndrome 03/16/2018   Cigarette smoker 03/16/2018   OSA (obstructive sleep apnea) 03/02/2018   COPD (chronic obstructive pulmonary disease) ([place]) 11/17/2016   Depression, recurrent ([place]) 11/17/2016   History of renal cell cancer 11/17/2016   Hyperlipidemia associated with type 2 diabetes mellitus ([place]) 11/17/2016   Hypertension associated with diabetes ([place]) 11/17/2016    Past Surgical History:  Procedure Laterality Date   ADRENAL GLAND SURGERY     BACK SURGERY     CERVICAL CONE BIOPSY     cervical cancer   CHOLECYSTECTOMY     KIDNEY MASS REMOVAL     NECK SURGERY     RIGHT WRIST SURGERY     SHOULDER SURGERY Left    TUBAL LIGATION       OB History    Gravida      Para      Term      Preterm      AB      Living  2     SAB      TAB      Ectopic      Multiple      Live Births               Home  Medications    Prior to Admission medications   Medication Sig Start Date End Date Taking? Authorizing Provider  albuterol (PROVENTIL HFA;VENTOLIN HFA) 108 (90 Base) MCG/ACT inhaler Inhale 1-2 puffs into the lungs every 6 (six) hours as needed for wheezing or shortness of breath. 08/17/18   Eustaquio Maize, MD  albuterol (PROVENTIL) (2.5 MG/3ML) 0.083% nebulizer solution Take 3 mLs (2.5 mg total) by nebulization every 6 (six) hours as needed. 08/17/18   Eustaquio Maize, MD  amphetamine-dextroamphetamine (ADDERALL) 10 MG tablet TAKE 1 TABLET BY MOUTH EVERY MORNING AND TAKE 1 TABLET BY MOUTH EVERY DAY AT NOON - MAY FILL IN JULY 04/27/17   [provider]  atorvastatin (LIPITOR) 20 MG tablet Take one tablet by mouth at 6pm daily Patient not taking: Reported on 05/08/2019 08/17/18   Eustaquio Maize, MD  Blood Glucose Monitoring Suppl (ACCU-CHEK AVIVA PLUS)  w/Device KIT Use to check BG bid 11/25/16   Eustaquio Maize, MD  celecoxib (CELEBREX) 200 MG capsule Take 1 capsule (200 mg total) by mouth 2 (two) times daily with a meal. 06/12/19   Quentin Cornwall, [place] N, PA-C  FARXIGA 10 MG TABS tablet TAKE 1 TABLET BY MOUTH DAILY 02/11/19   Baruch Gouty, FNP  fluticasone (FLOVENT HFA) 110 MCG/ACT inhaler Inhale 2 puffs into the lungs 2 (two) times daily. 08/17/18   Eustaquio Maize, MD  gabapentin (NEURONTIN) 300 MG capsule TAKE ONE CAPSULE BY MOUTH THREE TIMES DAILY 05/21/19   Loman Brooklyn, FNP  glucose blood (ACCU-CHEK AVIVA PLUS) test strip Use to check BG twice daily 11/25/16   Eustaquio Maize, MD  ibuprofen (ADVIL,MOTRIN) 200 MG tablet Take 200 mg by mouth every 6 (six) hours as needed.    [provider]  Lancets (ACCU-CHEK SOFT TOUCH) lancets Use to check BG twice a day 11/25/16   Eustaquio Maize, MD  metFORMIN (GLUCOPHAGE) 1000 MG tablet TAKE 1 TABLET BY MOUTH EVERY DAY WITH BREAKFAST 01/17/19   Rakes, Connye Burkitt, FNP  methocarbamol (ROBAXIN) 500 MG tablet Take 1 tablet (500 mg total) by mouth 2 (two) times daily as needed for muscle spasms. 06/12/19   Alyshia Kernan, [place] N, PA-C  methylPREDNISolone (MEDROL DOSEPAK) 4 MG TBPK tablet Use as directed 06/12/19   Martasia Talamante, [place] N, PA-C  QUEtiapine (SEROQUEL) 100 MG tablet TAKE TWO TABLETS BY MOUTH AT BEDTIME 06/06/19   Loman Brooklyn, FNP  traMADol (ULTRAM) 50 MG tablet Take 1 tablet (50 mg total) by mouth every 6 (six) hours as needed for severe pain. 06/12/19   Nakaiya Beddow, [place] N, PA-C    Family History Family History  Problem Relation Age of Onset   Coronary artery disease Mother 81   COPD Mother    Hypertension Mother    Breast cancer Mother    Uterine cancer Mother    Heart failure Father 56   Heart disease Father    Colon cancer Other        Aunt   Lung cancer Other        Aunt   Diabetes Sister    Heart disease Sister    Heart failure Maternal Grandmother    Heart failure  Paternal Grandmother    Cerebral palsy Brother    Early death Brother        MVA   Diabetes Sister    Heart disease Sister    Valvular heart disease Sister    Early death Sister        Murdered  Uterine cancer Daughter     Social History Social History   Tobacco Use   Smoking status: Current Every Day Smoker    Packs/day: 0.25    Years: 45.00    Pack years: 11.25    Types: Cigarettes   Smokeless tobacco: Never Used  Substance Use Topics   Alcohol use: No   Drug use: No     Allergies   Lexiscan [regadenoson]   Review of Systems Review of Systems  All other systems reviewed and are negative.    Physical Exam Updated Vital Signs BP (!) 149/96 (BP Location: Left Arm)    Pulse (!) 101    Temp 97.6 F (36.4 C) (Oral)    Resp 18    Ht '5\' 4"'$  (1.626 m)    Wt 104.3 kg    LMP 10/15/2015    SpO2 94%    BMI 39.48 kg/m   Physical Exam Vitals signs and nursing note reviewed.  Constitutional:      Appearance: She is well-developed.  HENT:     Head: Normocephalic and atraumatic.  Eyes:     Conjunctiva/sclera: Conjunctivae normal.  Cardiovascular:     Rate and Rhythm: Normal rate and regular rhythm.  Pulmonary:     Effort: Pulmonary effort is normal. No respiratory distress.     Breath sounds: Normal breath sounds.  Abdominal:     General: Bowel sounds are normal.     Palpations: Abdomen is soft.     Tenderness: There is no abdominal tenderness.  Musculoskeletal:     Comments: There are some tenderness to left gluteal region.  Midline spinal tenderness.  Skin:    General: Skin is warm.  Neurological:     Mental Status: She is alert.     Comments: Normal tone.  5/5 strength in BUE and BLE including strong and equal grip strength and dorsiflexion/plantar flexion Sensory: Pinprick and light touch normal in BLE extremities.  Gait: normal gait and balance CV: distal pulses palpable throughout    Psychiatric:        Behavior: Behavior normal.      ED  Treatments / Results  Labs (all labs ordered are listed, but only abnormal results are displayed) Labs Reviewed - No data to display  EKG None  Radiology No results found.  Procedures Procedures (including critical care time)  Medications Ordered in ED Medications  oxyCODONE-acetaminophen (PERCOCET/ROXICET) 5-325 MG per tablet 1 tablet (1 tablet Oral Given 06/12/19 1405)     Initial Impression / Assessment and Plan / ED Course  I have reviewed the triage vital signs and the nursing notes.  Pertinent labs & imaging results that were available during my care of the patient were reviewed by me and considered in my medical decision making (see chart for details).       Patient presenting with occurrence of right-sided low back pain attributed to sciatica.  No red flags concerning for cauda equina.  No focal neuro deficits.  She reports improvement with prior treatment including NSAIDs, steroid, muscle relaxer.  Will provide prescriptions and strongly encouraged she follow-up with PCP while she is waiting to be seen by specialist.  Discuss strict return precautions.  Patient verbalized understanding present care plan for discharge.  Discussed results, findings, treatment and follow up. Patient advised of return precautions. Patient verbalized understanding and agreed with plan.  [place] Controlled Substance reporting System queried  Final Clinical Impressions(s) / ED Diagnoses   Final diagnoses:  Sciatica of right side  ED Discharge Orders         Ordered    traMADol (ULTRAM) 50 MG tablet  Every 6 hours PRN     06/12/19 1422    celecoxib (CELEBREX) 200 MG capsule  2 times daily with meals     06/12/19 1422    methocarbamol (ROBAXIN) 500 MG tablet  2 times daily PRN     06/12/19 1422    methylPREDNISolone (MEDROL DOSEPAK) 4 MG TBPK tablet     06/12/19 1422           Chala Gul, [place] N, [place] 06/12/19 1422    Noemi Chapel, MD 06/13/19 [id number]

## 2019-06-12 NOTE — ED Triage Notes (Signed)
Patient c/o r sided back pain with sciatica since a fall in July. Seen by PCP with negative xrays. Patient states it had been better but started flaring up again about 3 days ago.

## 2019-06-14 ENCOUNTER — Other Ambulatory Visit: Payer: Self-pay | Admitting: Family Medicine

## 2019-06-20 DIAGNOSIS — Z6841 Body Mass Index (BMI) 40.0 and over, adult: Secondary | ICD-10-CM | POA: Diagnosis not present

## 2019-06-20 DIAGNOSIS — Z7689 Persons encountering health services in other specified circumstances: Secondary | ICD-10-CM | POA: Diagnosis not present

## 2019-06-20 DIAGNOSIS — R030 Elevated blood-pressure reading, without diagnosis of hypertension: Secondary | ICD-10-CM | POA: Diagnosis not present

## 2019-06-20 DIAGNOSIS — M5416 Radiculopathy, lumbar region: Secondary | ICD-10-CM | POA: Diagnosis not present

## 2019-07-03 DIAGNOSIS — Z7689 Persons encountering health services in other specified circumstances: Secondary | ICD-10-CM | POA: Diagnosis not present

## 2019-07-04 ENCOUNTER — Other Ambulatory Visit: Payer: Self-pay | Admitting: Family

## 2019-07-04 ENCOUNTER — Other Ambulatory Visit: Payer: Self-pay | Admitting: Family Medicine

## 2019-07-04 DIAGNOSIS — E119 Type 2 diabetes mellitus without complications: Secondary | ICD-10-CM

## 2019-07-05 ENCOUNTER — Encounter: Payer: Self-pay | Admitting: Family Medicine

## 2019-07-05 ENCOUNTER — Ambulatory Visit (INDEPENDENT_AMBULATORY_CARE_PROVIDER_SITE_OTHER): Payer: Medicaid Other | Admitting: Family Medicine

## 2019-07-05 DIAGNOSIS — J441 Chronic obstructive pulmonary disease with (acute) exacerbation: Secondary | ICD-10-CM | POA: Diagnosis not present

## 2019-07-05 MED ORDER — AMOXICILLIN-POT CLAVULANATE 875-125 MG PO TABS: 1.0000 | ORAL_TABLET | Freq: Two times a day (BID) | ORAL | 0 refills | Status: DC

## 2019-07-05 MED ORDER — HYDROCODONE-HOMATROPINE 5-1.5 MG/5ML PO SYRP: 5.0000 mL | ORAL_SOLUTION | Freq: Three times a day (TID) | ORAL | 0 refills | Status: DC | PRN

## 2019-07-05 MED ORDER — ALBUTEROL SULFATE (2.5 MG/3ML) 0.083% IN NEBU: 2.5000 mg | INHALATION_SOLUTION | Freq: Four times a day (QID) | RESPIRATORY_TRACT | 2 refills | Status: DC | PRN

## 2019-07-05 MED ORDER — PREDNISONE 10 MG (21) PO TBPK: ORAL_TABLET | ORAL | 0 refills | Status: DC

## 2019-07-05 MED ORDER — BENZONATATE 100 MG PO CAPS: 100.0000 mg | ORAL_CAPSULE | Freq: Three times a day (TID) | ORAL | 0 refills | Status: DC | PRN

## 2019-07-05 NOTE — Progress Notes (Signed)
Virtual Visit via Telephone Note  I connected with Loma Sender on 07/05/19 at 10:34 PM by telephone and verified that I am speaking with the correct person using two identifiers. Kimberly Green is currently located at home and nobody is currently with her during this visit. The provider, Loman Brooklyn, FNP is located in their home at time of visit.  I discussed the limitations, risks, security and privacy concerns of performing an evaluation and management service by telephone and the availability of in person appointments. I also discussed with the patient that there may be a patient responsible charge related to this service. The patient expressed understanding and agreed to proceed.  Subjective: PCP: Loman Brooklyn, FNP  Chief Complaint  Patient presents with  . Cough   Patient complains of cough and tightness in the chest. Cough is productive of cloudy to green sputum which increased from her normal cough and amount of sputum production and clarity.  Additional symptoms include sore throat and wheezing. Onset of symptoms was 1 day ago, gradually worsening since that time. She is drinking plenty of fluids. Evaluation to date: none. Treatment to date: none. She has a history of COPD. She does smoke. Patient has not had recent close contact with someone who has tested positive for COVID-19.    ROS: Per HPI  Current Outpatient Medications:  .  albuterol (PROVENTIL HFA;VENTOLIN HFA) 108 (90 Base) MCG/ACT inhaler, Inhale 1-2 puffs into the lungs every 6 (six) hours as needed for wheezing or shortness of breath., Disp: 1 Inhaler, Rfl: 0 .  albuterol (PROVENTIL) (2.5 MG/3ML) 0.083% nebulizer solution, Take 3 mLs (2.5 mg total) by nebulization every 6 (six) hours as needed., Disp: 75 mL, Rfl: 2 .  amphetamine-dextroamphetamine (ADDERALL) 10 MG tablet, TAKE 1 TABLET BY MOUTH EVERY MORNING AND TAKE 1 TABLET BY MOUTH EVERY DAY AT NOON - MAY FILL IN JULY, Disp: , Rfl: 0 .   atorvastatin (LIPITOR) 20 MG tablet, Take one tablet by mouth at 6pm daily (Patient not taking: Reported on 05/08/2019), Disp: 30 tablet, Rfl: 6 .  Blood Glucose Monitoring Suppl (ACCU-CHEK AVIVA PLUS) w/Device KIT, Use to check BG bid, Disp: 1 kit, Rfl: 0 .  celecoxib (CELEBREX) 200 MG capsule, Take 1 capsule (200 mg total) by mouth 2 (two) times daily with a meal., Disp: 20 capsule, Rfl: 0 .  FARXIGA 10 MG TABS tablet, TAKE 1 TABLET BY MOUTH DAILY, Disp: 30 tablet, Rfl: 5 .  fluticasone (FLOVENT HFA) 110 MCG/ACT inhaler, Inhale 2 puffs into the lungs 2 (two) times daily., Disp: 1 Inhaler, Rfl: 12 .  gabapentin (NEURONTIN) 300 MG capsule, TAKE ONE CAPSULE BY MOUTH THREE TIMES DAILY, Disp: 90 capsule, Rfl: 2 .  glucose blood (ACCU-CHEK AVIVA PLUS) test strip, Use to check BG twice daily, Disp: 100 each, Rfl: 2 .  ibuprofen (ADVIL,MOTRIN) 200 MG tablet, Take 200 mg by mouth every 6 (six) hours as needed., Disp: , Rfl:  .  Lancets (ACCU-CHEK SOFT TOUCH) lancets, Use to check BG twice a day, Disp: 100 each, Rfl: 2 .  metFORMIN (GLUCOPHAGE) 1000 MG tablet, TAKE 1 TABLET BY MOUTH EVERY DAY WITH BREAKFAST, Disp: 90 tablet, Rfl: 0 .  methocarbamol (ROBAXIN) 500 MG tablet, Take 1 tablet (500 mg total) by mouth 2 (two) times daily as needed for muscle spasms., Disp: 20 tablet, Rfl: 0 .  QUEtiapine (SEROQUEL) 100 MG tablet, TAKE TWO TABLETS BY MOUTH AT BEDTIME, Disp: 60 tablet, Rfl: 2 .  traMADol (ULTRAM)  50 MG tablet, Take 1 tablet (50 mg total) by mouth every 6 (six) hours as needed for severe pain., Disp: 15 tablet, Rfl: 0  Allergies  Allergen Reactions  . Lexiscan [Regadenoson] Shortness Of Breath    Severe transient respiratory distress   Past Medical History:  Diagnosis Date  . Adrenal tumor, benign    S/P L adrenal resection, 10/11 ([place])  . Bipolar disorder ([place])   . Cervical cancer ([place]) 1987  . Cholelithiasis   . COPD (chronic obstructive pulmonary disease) ([place])   . Diabetes mellitus  without complication ([place])   . Essential hypertension, benign   . IBS (irritable bowel syndrome)   . Mixed hyperlipidemia   . PTSD (post-traumatic stress disorder)   . Renal carcinoma ([place])    S/P partial R nephrectomy (1/3 removed), 8/11 ([place])  . Sleep apnea     Observations/Objective: A&O  No respiratory distress or wheezing audible over the phone Mood, judgement, and thought processes all WNL  Assessment and Plan: 1. COPD exacerbation ([place]) - Education provided on COPD exacerbations. Encouraged Mucinex and humidifier. Tylenol/Ibuprofen for pain/fever. Encouraged adequate hydration. Tessalon Perles during the day as needed. Hycodan QHS PRN.  - albuterol (PROVENTIL) (2.5 MG/3ML) 0.083% nebulizer solution; Take 3 mLs (2.5 mg total) by nebulization every 6 (six) hours as needed.  Dispense: 75 mL; Refill: 2 - amoxicillin-clavulanate (AUGMENTIN) 875-125 MG tablet; Take 1 tablet by mouth 2 (two) times daily.  Dispense: 14 tablet; Refill: 0 - predniSONE (STERAPRED UNI-PAK 21 TAB) 10 MG (21) TBPK tablet; Use as directed on back of pill pack  Dispense: 21 tablet; Refill: 0 - benzonatate (TESSALON PERLES) 100 MG capsule; Take 1 capsule (100 mg total) by mouth 3 (three) times daily as needed for cough.  Dispense: 20 capsule; Refill: 0 - HYDROcodone-homatropine (HYCODAN) 5-1.5 MG/5ML syrup; Take 5 mLs by mouth every 8 (eight) hours as needed for cough.  Dispense: 120 mL; Refill: 0   Follow Up Instructions:  I discussed the assessment and treatment plan with the patient. The patient was provided an opportunity to ask questions and all were answered. The patient agreed with the plan and demonstrated an understanding of the instructions.   The patient was advised to call back or seek an in-person evaluation if the symptoms worsen or if the condition fails to improve as anticipated.  The above assessment and management plan was discussed with the patient. The patient verbalized understanding of and  has agreed to the management plan. Patient is aware to call the clinic if symptoms persist or worsen. Patient is aware when to return to the clinic for a follow-up visit. Patient educated on when it is appropriate to go to the emergency department.   Time call ended: 10:43 PM  I provided 11 minutes of non-face-to-face time during this encounter.  [name], MSN, APRN, FNP-C [facility] 07/05/19

## 2019-07-05 NOTE — Patient Instructions (Signed)

## 2019-07-08 DIAGNOSIS — F909 Attention-deficit hyperactivity disorder, unspecified type: Secondary | ICD-10-CM | POA: Diagnosis not present

## 2019-07-10 DIAGNOSIS — M5416 Radiculopathy, lumbar region: Secondary | ICD-10-CM | POA: Diagnosis not present

## 2019-07-10 DIAGNOSIS — Z7689 Persons encountering health services in other specified circumstances: Secondary | ICD-10-CM | POA: Diagnosis not present

## 2019-07-10 DIAGNOSIS — M545 Low back pain: Secondary | ICD-10-CM | POA: Diagnosis not present

## 2019-07-10 DIAGNOSIS — S3992XA Unspecified injury of lower back, initial encounter: Secondary | ICD-10-CM | POA: Diagnosis not present

## 2019-07-16 DIAGNOSIS — M5416 Radiculopathy, lumbar region: Secondary | ICD-10-CM | POA: Diagnosis not present

## 2019-07-16 DIAGNOSIS — Z7689 Persons encountering health services in other specified circumstances: Secondary | ICD-10-CM | POA: Diagnosis not present

## 2019-07-24 ENCOUNTER — Encounter: Payer: Self-pay | Admitting: Physician Assistant

## 2019-07-24 ENCOUNTER — Ambulatory Visit (INDEPENDENT_AMBULATORY_CARE_PROVIDER_SITE_OTHER): Payer: Medicaid Other | Admitting: Physician Assistant

## 2019-07-24 DIAGNOSIS — J029 Acute pharyngitis, unspecified: Secondary | ICD-10-CM | POA: Diagnosis not present

## 2019-07-24 MED ORDER — FLUCONAZOLE 150 MG PO TABS: 150.0000 mg | ORAL_TABLET | Freq: Once | ORAL | 0 refills | Status: AC

## 2019-07-24 MED ORDER — AMOXICILLIN 500 MG PO CAPS: 500.0000 mg | ORAL_CAPSULE | Freq: Three times a day (TID) | ORAL | 0 refills | Status: DC

## 2019-07-24 NOTE — Progress Notes (Signed)
Telephone visit  Subjective: CC: Sore throat, exposure to strep PCP: Loman Brooklyn, FNP FWY:[id number] Kimberly Green is a 58 y.o. female calls for telephone consult today. Patient provides verbal consent for consult held via phone.  Patient is identified with 2 separate identifiers.  At this time the entire area is on COVID-19 social distancing and stay home orders are in place.  Patient is of higher risk and therefore we are performing this by a virtual method.  Location of patient: Home Location of provider: HOME Others present for call: No  Over the past 2 days the patient has had increasing sore throat that is quite severe at times.  Her grandson tested positive last week.  And the 2 other grandchildren became positive in most recent days.  She is had some aching, decreased appetite, feverish.  She knows that she was probably exposed in the pediatric waiting room.  There was a sick child and there when she had taken her grandchildren and for vaccines.   ROS: Per HPI  Allergies  Allergen Reactions  . Lexiscan [Regadenoson] Shortness Of Breath    Severe transient respiratory distress   Past Medical History:  Diagnosis Date  . Adrenal tumor, benign    S/P L adrenal resection, 10/11 ([place])  . Bipolar disorder ([place])   . Cervical cancer ([place]) 1987  . Cholelithiasis   . COPD (chronic obstructive pulmonary disease) ([place])   . Diabetes mellitus without complication ([place])   . Essential hypertension, benign   . IBS (irritable bowel syndrome)   . Mixed hyperlipidemia   . PTSD (post-traumatic stress disorder)   . Renal carcinoma ([place])    S/P partial R nephrectomy (1/3 removed), 8/11 ([place])  . Sleep apnea     Current Outpatient Medications:  .  albuterol (PROVENTIL HFA;VENTOLIN HFA) 108 (90 Base) MCG/ACT inhaler, Inhale 1-2 puffs into the lungs every 6 (six) hours as needed for wheezing or shortness of breath., Disp: 1 Inhaler, Rfl: 0 .  albuterol (PROVENTIL) (2.5 MG/3ML)  0.083% nebulizer solution, Take 3 mLs (2.5 mg total) by nebulization every 6 (six) hours as needed., Disp: 75 mL, Rfl: 2 .  amoxicillin (AMOXIL) 500 MG capsule, Take 1 capsule (500 mg total) by mouth 3 (three) times daily., Disp: 30 capsule, Rfl: 0 .  amoxicillin-clavulanate (AUGMENTIN) 875-125 MG tablet, Take 1 tablet by mouth 2 (two) times daily., Disp: 14 tablet, Rfl: 0 .  amphetamine-dextroamphetamine (ADDERALL) 10 MG tablet, TAKE 1 TABLET BY MOUTH EVERY MORNING AND TAKE 1 TABLET BY MOUTH EVERY DAY AT NOON - MAY FILL IN JULY, Disp: , Rfl: 0 .  atorvastatin (LIPITOR) 20 MG tablet, Take one tablet by mouth at 6pm daily (Patient not taking: Reported on 05/08/2019), Disp: 30 tablet, Rfl: 6 .  benzonatate (TESSALON PERLES) 100 MG capsule, Take 1 capsule (100 mg total) by mouth 3 (three) times daily as needed for cough., Disp: 20 capsule, Rfl: 0 .  Blood Glucose Monitoring Suppl (ACCU-CHEK AVIVA PLUS) w/Device KIT, Use to check BG bid, Disp: 1 kit, Rfl: 0 .  celecoxib (CELEBREX) 200 MG capsule, Take 1 capsule (200 mg total) by mouth 2 (two) times daily with a meal., Disp: 20 capsule, Rfl: 0 .  FARXIGA 10 MG TABS tablet, TAKE 1 TABLET BY MOUTH DAILY, Disp: 30 tablet, Rfl: 5 .  fluconazole (DIFLUCAN) 150 MG tablet, Take 1 tablet (150 mg total) by mouth once for 1 dose., Disp: 1 tablet, Rfl: 0 .  fluticasone (FLOVENT HFA) 110 MCG/ACT  inhaler, Inhale 2 puffs into the lungs 2 (two) times daily., Disp: 1 Inhaler, Rfl: 12 .  gabapentin (NEURONTIN) 300 MG capsule, TAKE ONE CAPSULE BY MOUTH THREE TIMES DAILY, Disp: 90 capsule, Rfl: 2 .  glucose blood (ACCU-CHEK AVIVA PLUS) test strip, Use to check BG twice daily, Disp: 100 each, Rfl: 2 .  HYDROcodone-acetaminophen (NORCO/VICODIN) 5-325 MG tablet, for pain, Disp: , Rfl:  .  HYDROcodone-homatropine (HYCODAN) 5-1.5 MG/5ML syrup, Take 5 mLs by mouth every 8 (eight) hours as needed for cough., Disp: 120 mL, Rfl: 0 .  ibuprofen (ADVIL,MOTRIN) 200 MG tablet, Take 200 mg  by mouth every 6 (six) hours as needed., Disp: , Rfl:  .  Lancets (ACCU-CHEK SOFT TOUCH) lancets, Use to check BG twice a day, Disp: 100 each, Rfl: 2 .  metFORMIN (GLUCOPHAGE) 1000 MG tablet, TAKE 1 TABLET BY MOUTH EVERY DAY WITH BREAKFAST, Disp: 90 tablet, Rfl: 0 .  methocarbamol (ROBAXIN) 500 MG tablet, Take 1 tablet (500 mg total) by mouth 2 (two) times daily as needed for muscle spasms., Disp: 20 tablet, Rfl: 0 .  predniSONE (STERAPRED UNI-PAK 21 TAB) 10 MG (21) TBPK tablet, Use as directed on back of pill pack, Disp: 21 tablet, Rfl: 0 .  QUEtiapine (SEROQUEL) 100 MG tablet, TAKE TWO TABLETS BY MOUTH AT BEDTIME, Disp: 60 tablet, Rfl: 2 .  traMADol (ULTRAM) 50 MG tablet, Take 1 tablet (50 mg total) by mouth every 6 (six) hours as needed for severe pain., Disp: 15 tablet, Rfl: 0  Assessment/ Plan: 58 y.o. female   1. Sore throat Amoxicillin 500 mg 1 tablet 3 times a day for 10 days for probable strep Diflucan 150 mg 1 tablet   No follow-ups on file.  Continue all other maintenance medications as listed above.  Start time: 3:45 PM End time: 3:51 PM  Meds ordered this encounter  Medications  . amoxicillin (AMOXIL) 500 MG capsule    Sig: Take 1 capsule (500 mg total) by mouth 3 (three) times daily.    Dispense:  30 capsule    Refill:  0    Order Specific Question:   Supervising Provider    Answer:   Janora Norlander [[id number]]  . fluconazole (DIFLUCAN) 150 MG tablet    Sig: Take 1 tablet (150 mg total) by mouth once for 1 dose.    Dispense:  1 tablet    Refill:  0    Order Specific Question:   Supervising Provider    Answer:   Janora Norlander [[id number]]    Particia Nearing PA-C [place] [phone]

## 2019-07-25 ENCOUNTER — Other Ambulatory Visit: Payer: Self-pay | Admitting: Physician Assistant

## 2019-08-06 ENCOUNTER — Telehealth: Payer: Self-pay | Admitting: Family Medicine

## 2019-08-06 NOTE — Telephone Encounter (Signed)
Patient aware she needs a face to face for shower chair. Patient states she will call back to set up appointment.

## 2019-08-06 NOTE — Telephone Encounter (Signed)
Please have her come in for an appointment

## 2019-08-06 NOTE — Telephone Encounter (Signed)
Most patients have to have a face to face appointment for these items.  Please advise.

## 2019-08-07 ENCOUNTER — Ambulatory Visit: Payer: Medicaid Other | Admitting: Family Medicine

## 2019-08-14 DIAGNOSIS — Z5181 Encounter for therapeutic drug level monitoring: Secondary | ICD-10-CM | POA: Diagnosis not present

## 2019-08-21 DIAGNOSIS — R05 Cough: Secondary | ICD-10-CM | POA: Diagnosis not present

## 2019-08-21 DIAGNOSIS — J441 Chronic obstructive pulmonary disease with (acute) exacerbation: Secondary | ICD-10-CM | POA: Diagnosis not present

## 2019-08-21 DIAGNOSIS — Z5181 Encounter for therapeutic drug level monitoring: Secondary | ICD-10-CM | POA: Diagnosis not present

## 2019-08-21 DIAGNOSIS — R0602 Shortness of breath: Secondary | ICD-10-CM | POA: Diagnosis not present

## 2019-08-21 DIAGNOSIS — Z720 Tobacco use: Secondary | ICD-10-CM | POA: Diagnosis not present

## 2019-08-26 DIAGNOSIS — Z7689 Persons encountering health services in other specified circumstances: Secondary | ICD-10-CM | POA: Diagnosis not present

## 2019-08-26 DIAGNOSIS — M47816 Spondylosis without myelopathy or radiculopathy, lumbar region: Secondary | ICD-10-CM | POA: Insufficient documentation

## 2019-08-28 ENCOUNTER — Other Ambulatory Visit: Payer: Self-pay | Admitting: Family Medicine

## 2019-08-28 DIAGNOSIS — J441 Chronic obstructive pulmonary disease with (acute) exacerbation: Secondary | ICD-10-CM

## 2019-09-03 ENCOUNTER — Ambulatory Visit: Payer: Medicaid Other | Admitting: Family Medicine

## 2019-09-03 ENCOUNTER — Other Ambulatory Visit: Payer: Self-pay | Admitting: *Deleted

## 2019-09-03 ENCOUNTER — Telehealth: Payer: Medicaid Other | Admitting: Family Medicine

## 2019-09-03 DIAGNOSIS — E785 Hyperlipidemia, unspecified: Secondary | ICD-10-CM

## 2019-09-03 MED ORDER — ATORVASTATIN CALCIUM 20 MG PO TABS: ORAL_TABLET | ORAL | 0 refills | Status: DC

## 2019-09-11 ENCOUNTER — Other Ambulatory Visit: Payer: Self-pay

## 2019-09-12 ENCOUNTER — Telehealth: Payer: Self-pay | Admitting: Family Medicine

## 2019-09-12 ENCOUNTER — Ambulatory Visit (INDEPENDENT_AMBULATORY_CARE_PROVIDER_SITE_OTHER): Payer: Medicaid Other | Admitting: Family Medicine

## 2019-09-12 ENCOUNTER — Encounter: Payer: Self-pay | Admitting: Family Medicine

## 2019-09-12 VITALS — BP 107/71 | HR 96 | Temp 98.9°F | Ht 64.0 in | Wt 249.0 lb

## 2019-09-12 DIAGNOSIS — E119 Type 2 diabetes mellitus without complications: Secondary | ICD-10-CM

## 2019-09-12 DIAGNOSIS — Z1211 Encounter for screening for malignant neoplasm of colon: Secondary | ICD-10-CM

## 2019-09-12 DIAGNOSIS — G8929 Other chronic pain: Secondary | ICD-10-CM

## 2019-09-12 DIAGNOSIS — R748 Abnormal levels of other serum enzymes: Secondary | ICD-10-CM

## 2019-09-12 DIAGNOSIS — Z23 Encounter for immunization: Secondary | ICD-10-CM | POA: Diagnosis not present

## 2019-09-12 DIAGNOSIS — J449 Chronic obstructive pulmonary disease, unspecified: Secondary | ICD-10-CM | POA: Diagnosis not present

## 2019-09-12 DIAGNOSIS — E785 Hyperlipidemia, unspecified: Secondary | ICD-10-CM

## 2019-09-12 DIAGNOSIS — E1159 Type 2 diabetes mellitus with other circulatory complications: Secondary | ICD-10-CM

## 2019-09-12 DIAGNOSIS — I10 Essential (primary) hypertension: Secondary | ICD-10-CM

## 2019-09-12 DIAGNOSIS — E1169 Type 2 diabetes mellitus with other specified complication: Secondary | ICD-10-CM

## 2019-09-12 DIAGNOSIS — M5441 Lumbago with sciatica, right side: Secondary | ICD-10-CM

## 2019-09-12 LAB — BAYER DCA HB A1C WAIVED: HB A1C (BAYER DCA - WAIVED): 7.1 % — ABNORMAL HIGH (ref <7.0)

## 2019-09-12 MED ORDER — SHINGRIX 50 MCG/0.5ML IM SUSR: 0.5000 mL | Freq: Once | INTRAMUSCULAR | 0 refills | Status: AC

## 2019-09-12 NOTE — Telephone Encounter (Signed)
According to patients med list in chart she should be taking metformin daily. Is this correct ? Please advise and send to pools.

## 2019-09-12 NOTE — Telephone Encounter (Signed)
Patient called stating that when she got home she looked at her Rx bottles and saw that she was supposed to only being taking 1 (1000 mg) Metformin pill each day but says she was taking 2, unaware that she was only supposed to take 1. Pt says she is home if [place] wants to call her.   (Had visit with Britney earlier today)

## 2019-09-12 NOTE — Progress Notes (Signed)
Assessment & Plan:  1. Chronic right-sided low back pain with right-sided sciatica - Written order for rolling walker and shower chair is being sent to [facility] to help patient with difficulty with ambulation and the inability to stand for prolonged periods of time.  2. Diabetes mellitus without complication ([place]) Lab Results  Component Value Date   HGBA1C 7.1 (H) 09/12/2019   HGBA1C 7.8 (H) 11/28/2018   HGBA1C 7.1 (H) 08/17/2018  - Diabetes is not at goal of A1c < 7. - Medications: Patient is going to go home and verify the frequency of Metformin on her bottle versus what she has been taking and call back. - Patient is currently taking a statin. Patient is not taking an ACE-inhibitor/ARB.  - Last foot exam: 11/28/2018 - Last diabetic eye exam: unknown -patient to schedule. - Urine Microalbumin/Creat Ratio: 05/08/2019 - Instruction/counseling given: reminded to get eye exam and discussed diet - hgba1c - CMP14+EGFR; Future  3. Chronic obstructive pulmonary disease, unspecified COPD type ([place]) - Patient requesting steroid taper.  This will be sent in.  4. Hypertension associated with diabetes ([place]) - Well controlled on current regimen.  - CMP14+EGFR; Future  5. Hyperlipidemia associated with type 2 diabetes mellitus ([place]) - Patient was to return for fasting lipid panel and did not.  This is being ordered again today and she is encouraged to return as soon as possible to complete this as she is not fasting today. - Lipid Panel; Future - CMP14+EGFR; Future  6. Elevated liver enzymes - Hepatitis panel, acute; Future  7. Colon cancer screening - Cologuard  8. Need for immunization against influenza - Flu Vaccine QUAD 36+ mos IM  9. Immunization due - SHINGRIX injection; Inject 0.5 mLs into the muscle once for 1 dose.  Dispense: 0.5 mL; Refill: 0   Return in about 3 months (around 12/11/2019) for DM.  [name], MSN, APRN, FNP-C [facility]   Subjective:    Patient ID: Kimberly Green, female    DOB: 17-Aug-1961, 58 y.o.   MRN: [id number]  Patient Care Team: Loman Brooklyn, FNP as PCP - General (Family Medicine) [place]  as Consulting Physician (Psychiatry)   Chief Complaint:  Chief Complaint  Patient presents with  . face to face    HPI: Kimberly Green is a 58 y.o. female presenting on 09/12/2019 for face to face  Patient is here requesting a walker and a shower chair.  Five months ago her grandson stopped in front of her on the stairs and she went down.  She has been seeing Dr. Ronnald Ramp with [facility].  She is scheduled to have some injections done on 09/18/2019 and reports that surgery is not recommended for her pain.  The pain is located in her lower back and she is having sciatica on the right side.  Due to this pain she has trouble walking around and is unable to stand for prolonged periods of time.  She does have a prescription of tramadol for pain but is not taking it.  She is terrified of narcotics due to family history of drug abuse.  She has had 3 previous back surgeries as well.  Diabetes: Patient presents for follow up of diabetes. Known diabetic complications: none. Medication compliance: taking as prescribed - reports she is taking metformin twice daily, not once as we have indicated in our records. Current exercise: none. Is she  on ACE inhibitor or angiotensin II receptor blocker? No. Is  she on a statin? Yes.   Lab Results  Component Value Date   HGBA1C 7.1 (H) 09/12/2019   HGBA1C 7.8 (H) 11/28/2018   HGBA1C 7.1 (H) 08/17/2018   Lab Results  Component Value Date   LDLCALC Comment 05/08/2019   CREATININE 0.68 05/08/2019     New complaints: Patient reports she is having increased congestion and cough with sputum production.  She has been tested for Covid and was negative.  Social history:  Relevant past medical, surgical, family and social history reviewed and  updated as indicated. Interim medical history since our last visit reviewed.  Allergies and medications reviewed and updated.  DATA REVIEWED: CHART IN EPIC  ROS: Negative unless specifically indicated above in HPI.    Current Outpatient Medications:  .  albuterol (PROVENTIL HFA;VENTOLIN HFA) 108 (90 Base) MCG/ACT inhaler, Inhale 1-2 puffs into the lungs every 6 (six) hours as needed for wheezing or shortness of breath., Disp: 1 Inhaler, Rfl: 0 .  albuterol (PROVENTIL) (2.5 MG/3ML) 0.083% nebulizer solution, INHALE THE CONTENTS OF ONE VIAL (3ML) VIA NEBULIZER EVERY 6 HOURS AS NEEDED, Disp: 90 mL, Rfl: 2 .  amphetamine-dextroamphetamine (ADDERALL) 10 MG tablet, TAKE 1 TABLET BY MOUTH EVERY MORNING AND TAKE 1 TABLET BY MOUTH EVERY DAY AT NOON - MAY FILL IN JULY, Disp: , Rfl: 0 .  atorvastatin (LIPITOR) 20 MG tablet, Take one tablet by mouth at 6pm daily, Disp: 30 tablet, Rfl: 0 .  Blood Glucose Monitoring Suppl (ACCU-CHEK AVIVA PLUS) w/Device KIT, Use to check BG bid, Disp: 1 kit, Rfl: 0 .  fluticasone (FLOVENT HFA) 110 MCG/ACT inhaler, Inhale 2 puffs into the lungs 2 (two) times daily., Disp: 1 Inhaler, Rfl: 12 .  gabapentin (NEURONTIN) 300 MG capsule, TAKE ONE CAPSULE BY MOUTH THREE TIMES DAILY, Disp: 90 capsule, Rfl: 2 .  glucose blood (ACCU-CHEK AVIVA PLUS) test strip, Use to check BG twice daily, Disp: 100 each, Rfl: 2 .  Lancets (ACCU-CHEK SOFT TOUCH) lancets, Use to check BG twice a day, Disp: 100 each, Rfl: 2 .  FARXIGA 10 MG TABS tablet, TAKE 1 TABLET BY MOUTH DAILY, Disp: 30 tablet, Rfl: 2 .  glimepiride (AMARYL) 1 MG tablet, Take 1 tablet (1 mg total) by mouth daily with breakfast., Disp: 30 tablet, Rfl: 2 .  metFORMIN (GLUCOPHAGE) 1000 MG tablet, Take 1 tablet (1,000 mg total) by mouth 2 (two) times daily with a meal., Disp: 180 tablet, Rfl: 0 .  predniSONE (STERAPRED UNI-PAK 21 TAB) 10 MG (21) TBPK tablet, Use as directed on back of pill pack, Disp: 21 tablet, Rfl: 0 .  QUEtiapine  (SEROQUEL) 100 MG tablet, TAKE TWO TABLETS BY MOUTH AT BEDTIME, Disp: 60 tablet, Rfl: 2 .  traMADol (ULTRAM) 50 MG tablet, Take 1 tablet (50 mg total) by mouth every 6 (six) hours as needed for severe pain. (Patient not taking: Reported on 09/12/2019), Disp: 15 tablet, Rfl: 0   Allergies  Allergen Reactions  . Lexiscan [Regadenoson] Shortness Of Breath    Severe transient respiratory distress   Past Medical History:  Diagnosis Date  . Adrenal tumor, benign    S/P L adrenal resection, 10/11 ([place])  . Bipolar disorder ([place])   . Cervical cancer ([place]) 1987  . Cholelithiasis   . COPD (chronic obstructive pulmonary disease) ([place])   . Diabetes mellitus without complication ([place])   . Essential hypertension, benign   . IBS (irritable bowel syndrome)   . Mixed hyperlipidemia   . PTSD (post-traumatic stress disorder)   .  Renal carcinoma ([place])    S/P partial R nephrectomy (1/3 removed), 8/11 ([place])  . Sleep apnea     Past Surgical History:  Procedure Laterality Date  . ADRENAL GLAND SURGERY    . BACK SURGERY    . CERVICAL CONE BIOPSY     cervical cancer  . CHOLECYSTECTOMY    . KIDNEY MASS REMOVAL    . NECK SURGERY    . RIGHT WRIST SURGERY    . SHOULDER SURGERY Left   . TUBAL LIGATION      Social History   Socioeconomic History  . Marital status: Divorced    Spouse name: Not on file  . Number of children: 2  . Years of education: 26  . Highest education level: Not on file  Occupational History  . Not on file  Social Needs  . Financial resource strain: Not on file  . Food insecurity    Worry: Not on file    Inability: Not on file  . Transportation needs    Medical: Not on file    Non-medical: Not on file  Tobacco Use  . Smoking status: Current Every Day Smoker    Packs/day: 0.25    Years: 45.00    Pack years: 11.25    Types: Cigarettes  . Smokeless tobacco: Never Used  Substance and Sexual Activity  . Alcohol use: No  . Drug use: No  . Sexual activity: Not Currently   Lifestyle  . Physical activity    Days per week: Not on file    Minutes per session: Not on file  . Stress: Not on file  Relationships  . Social Herbalist on phone: Not on file    Gets together: Not on file    Attends religious service: Not on file    Active member of club or organization: Not on file    Attends meetings of clubs or organizations: Not on file    Relationship status: Not on file  . Intimate partner violence    Fear of current or ex partner: Not on file    Emotionally abused: Not on file    Physically abused: Not on file    Forced sexual activity: Not on file  Other Topics Concern  . Not on file  Social History Narrative   Drinks 1 cup of coffee a day         Objective:    BP 107/71   Pulse 96   Temp 98.9 F (37.2 C) (Temporal)   Ht _0  (1.626 m)   Wt 249 lb (112.9 kg)   LMP 10/15/2015   SpO2 96%   BMI 42.74 kg/m   Physical Exam Vitals signs reviewed.  Constitutional:      General: She is not in acute distress.    Appearance: Normal appearance. She is morbidly obese. She is not ill-appearing, toxic-appearing or diaphoretic.  HENT:     Head: Normocephalic and atraumatic.  Eyes:     General: No scleral icterus.       Right eye: No discharge.        Left eye: No discharge.     Conjunctiva/sclera: Conjunctivae normal.  Neck:     Musculoskeletal: Normal range of motion.  Cardiovascular:     Rate and Rhythm: Normal rate and regular rhythm.     Heart sounds: Normal heart sounds. No murmur. No friction rub. No gallop.   Pulmonary:     Effort: Pulmonary effort is normal. No respiratory distress.  Breath sounds: No stridor. Examination of the right-upper field reveals wheezing. Examination of the right-middle field reveals wheezing. Examination of the right-lower field reveals wheezing. Wheezing present. No decreased breath sounds, rhonchi or rales.  Musculoskeletal: Normal range of motion.     Lumbar back: She exhibits pain.  Skin:     General: Skin is warm and dry.     Capillary Refill: Capillary refill takes less than 2 seconds.  Neurological:     General: No focal deficit present.     Mental Status: She is alert and oriented to person, place, and time. Mental status is at baseline.  Psychiatric:        Mood and Affect: Mood normal.        Behavior: Behavior normal.        Thought Content: Thought content normal.        Judgment: Judgment normal.     Lab Results  Component Value Date   TSH 4.430 07/19/2017   Lab Results  Component Value Date   WBC 7.9 05/08/2019   HGB 15.0 05/08/2019   HCT 43.4 05/08/2019   MCV 85 05/08/2019   PLT 219 05/08/2019   Lab Results  Component Value Date   NA 145 (H) 05/08/2019   K 3.9 05/08/2019   CO2 23 05/08/2019   GLUCOSE 140 (H) 05/08/2019   BUN 7 05/08/2019   CREATININE 0.68 05/08/2019   BILITOT 0.2 05/08/2019   ALKPHOS 83 05/08/2019   AST 45 (H) 05/08/2019   ALT 60 (H) 05/08/2019   PROT 6.5 05/08/2019   ALBUMIN 4.0 05/08/2019   CALCIUM 9.4 05/08/2019   Lab Results  Component Value Date   CHOL 253 (H) 05/08/2019   Lab Results  Component Value Date   HDL 29 (L) 05/08/2019   Lab Results  Component Value Date   LDLCALC Comment 05/08/2019   Lab Results  Component Value Date   TRIG 473 (H) 05/08/2019   Lab Results  Component Value Date   CHOLHDL 8.7 (H) 05/08/2019   Lab Results  Component Value Date   HGBA1C 7.1 (H) 09/12/2019

## 2019-09-12 NOTE — Patient Instructions (Signed)
Please schedule pap smear and diabetic eye exam.

## 2019-09-13 ENCOUNTER — Other Ambulatory Visit: Payer: Self-pay | Admitting: Family Medicine

## 2019-09-13 DIAGNOSIS — F32A Depression, unspecified: Secondary | ICD-10-CM

## 2019-09-13 DIAGNOSIS — F329 Major depressive disorder, single episode, unspecified: Secondary | ICD-10-CM

## 2019-09-13 DIAGNOSIS — E119 Type 2 diabetes mellitus without complications: Secondary | ICD-10-CM

## 2019-09-13 MED ORDER — PREDNISONE 10 MG (21) PO TBPK: ORAL_TABLET | ORAL | 0 refills | Status: DC

## 2019-09-13 MED ORDER — GLIMEPIRIDE 1 MG PO TABS: 1.0000 mg | ORAL_TABLET | Freq: Every day | ORAL | 2 refills | Status: DC

## 2019-09-13 MED ORDER — METFORMIN HCL 1000 MG PO TABS: 1000.0000 mg | ORAL_TABLET | Freq: Two times a day (BID) | ORAL | 0 refills | Status: DC

## 2019-09-13 NOTE — Telephone Encounter (Signed)
Pt aware.

## 2019-09-13 NOTE — Telephone Encounter (Signed)
I would like her to continue taking metformin 1,000 mg PO BID (this is maxed out). I will update her prescription to reflect this. She should also continue Farxiga 10 mg once daily (also maxed out). Due to her A1c of 7.1 I will add glimepiride 1 mg once daily.

## 2019-09-13 NOTE — Telephone Encounter (Signed)
Patient will take medication as you directed.  She also wanted to ask if you were going to send in Prednisone for her.

## 2019-09-13 NOTE — Telephone Encounter (Signed)
Steroid taper sent.  I do apologize I have not yet closed her chart from yesterday which is why it was not already sent.

## 2019-09-14 ENCOUNTER — Encounter: Payer: Self-pay | Admitting: Family Medicine

## 2019-09-14 DIAGNOSIS — R748 Abnormal levels of other serum enzymes: Secondary | ICD-10-CM | POA: Insufficient documentation

## 2019-09-14 DIAGNOSIS — G8929 Other chronic pain: Secondary | ICD-10-CM | POA: Insufficient documentation

## 2019-09-16 DIAGNOSIS — F909 Attention-deficit hyperactivity disorder, unspecified type: Secondary | ICD-10-CM | POA: Diagnosis not present

## 2019-09-16 DIAGNOSIS — Z5181 Encounter for therapeutic drug level monitoring: Secondary | ICD-10-CM | POA: Diagnosis not present

## 2019-09-18 DIAGNOSIS — M461 Sacroiliitis, not elsewhere classified: Secondary | ICD-10-CM | POA: Diagnosis not present

## 2019-09-18 DIAGNOSIS — Z7689 Persons encountering health services in other specified circumstances: Secondary | ICD-10-CM | POA: Diagnosis not present

## 2019-09-18 DIAGNOSIS — Z6841 Body Mass Index (BMI) 40.0 and over, adult: Secondary | ICD-10-CM | POA: Diagnosis not present

## 2019-09-19 ENCOUNTER — Ambulatory Visit (INDEPENDENT_AMBULATORY_CARE_PROVIDER_SITE_OTHER): Payer: Medicaid Other | Admitting: Family Medicine

## 2019-09-19 ENCOUNTER — Encounter: Payer: Self-pay | Admitting: Family Medicine

## 2019-09-19 DIAGNOSIS — K047 Periapical abscess without sinus: Secondary | ICD-10-CM | POA: Diagnosis not present

## 2019-09-19 DIAGNOSIS — Z5181 Encounter for therapeutic drug level monitoring: Secondary | ICD-10-CM | POA: Diagnosis not present

## 2019-09-19 MED ORDER — AMOXICILLIN 500 MG PO CAPS: ORAL_CAPSULE | ORAL | 0 refills | Status: DC

## 2019-09-19 NOTE — Progress Notes (Signed)
Virtual Visit via Telephone Note  I connected with Kimberly Green on 09/19/19 at 4:13 PM by telephone and verified that I am speaking with the correct person using two identifiers. Kimberly Green is currently located at home and nobody is currently with her during this visit. The provider, Loman Brooklyn, FNP is located in their home at time of visit.  I discussed the limitations, risks, security and privacy concerns of performing an evaluation and management service by telephone and the availability of in person appointments. I also discussed with the patient that there may be a patient responsible charge related to this service. The patient expressed understanding and agreed to proceed.  Subjective: PCP: Loman Brooklyn, FNP  Chief Complaint  Patient presents with  . Dental Pain   Patient reports on 09/14/2019 she took her grandchildren to a mobile dental bus.  While she was waiting on them she was telling someone that works there about her teeth that needed to be pulled and they stated because she had Medicaid they could do that for her.  Patient had 3 teeth pulled that day.  She reports she is now having oozing pus coming out of where they pulled one of the 3 teeth.  There is swelling in her mouth and erythema.  She does not have a thermometer to check to see if she has a fever but reports she has been having chills and just overall does not feel well.   ROS: Per HPI  Current Outpatient Medications:  .  albuterol (PROVENTIL HFA;VENTOLIN HFA) 108 (90 Base) MCG/ACT inhaler, Inhale 1-2 puffs into the lungs every 6 (six) hours as needed for wheezing or shortness of breath., Disp: 1 Inhaler, Rfl: 0 .  albuterol (PROVENTIL) (2.5 MG/3ML) 0.083% nebulizer solution, INHALE THE CONTENTS OF ONE VIAL (3ML) VIA NEBULIZER EVERY 6 HOURS AS NEEDED, Disp: 90 mL, Rfl: 2 .  amphetamine-dextroamphetamine (ADDERALL) 10 MG tablet, TAKE 1 TABLET BY MOUTH EVERY MORNING AND TAKE 1 TABLET BY MOUTH  EVERY DAY AT NOON - MAY FILL IN JULY, Disp: , Rfl: 0 .  atorvastatin (LIPITOR) 20 MG tablet, Take one tablet by mouth at 6pm daily, Disp: 30 tablet, Rfl: 0 .  Blood Glucose Monitoring Suppl (ACCU-CHEK AVIVA PLUS) w/Device KIT, Use to check BG bid, Disp: 1 kit, Rfl: 0 .  cetirizine (ZYRTEC) 10 MG tablet, Take 10 mg by mouth daily., Disp: , Rfl:  .  FARXIGA 10 MG TABS tablet, TAKE 1 TABLET BY MOUTH DAILY, Disp: 30 tablet, Rfl: 2 .  fluticasone (FLOVENT HFA) 110 MCG/ACT inhaler, Inhale 2 puffs into the lungs 2 (two) times daily., Disp: 1 Inhaler, Rfl: 12 .  gabapentin (NEURONTIN) 300 MG capsule, TAKE ONE CAPSULE BY MOUTH THREE TIMES DAILY, Disp: 90 capsule, Rfl: 2 .  glimepiride (AMARYL) 1 MG tablet, Take 1 tablet (1 mg total) by mouth daily with breakfast., Disp: 30 tablet, Rfl: 2 .  glucose blood (ACCU-CHEK AVIVA PLUS) test strip, Use to check BG twice daily, Disp: 100 each, Rfl: 2 .  Lancets (ACCU-CHEK SOFT TOUCH) lancets, Use to check BG twice a day, Disp: 100 each, Rfl: 2 .  metFORMIN (GLUCOPHAGE) 1000 MG tablet, Take 1 tablet (1,000 mg total) by mouth 2 (two) times daily with a meal., Disp: 180 tablet, Rfl: 0 .  predniSONE (STERAPRED UNI-PAK 21 TAB) 10 MG (21) TBPK tablet, Use as directed on back of pill pack, Disp: 21 tablet, Rfl: 0 .  QUEtiapine (SEROQUEL) 100 MG tablet, TAKE TWO  TABLETS BY MOUTH AT BEDTIME, Disp: 60 tablet, Rfl: 2 .  traMADol (ULTRAM) 50 MG tablet, Take 1 tablet (50 mg total) by mouth every 6 (six) hours as needed for severe pain. (Patient not taking: Reported on 09/12/2019), Disp: 15 tablet, Rfl: 0  Allergies  Allergen Reactions  . Lexiscan [Regadenoson] Shortness Of Breath    Severe transient respiratory distress   Past Medical History:  Diagnosis Date  . Adrenal tumor, benign    S/P L adrenal resection, 10/11 ([place])  . Bipolar disorder ([place])   . Cervical cancer ([place]) 1987  . Cholelithiasis   . COPD (chronic obstructive pulmonary disease) ([place])   . Diabetes  mellitus without complication ([place])   . Essential hypertension, benign   . IBS (irritable bowel syndrome)   . Mixed hyperlipidemia   . PTSD (post-traumatic stress disorder)   . Renal carcinoma ([place])    S/P partial R nephrectomy (1/3 removed), 8/11 ([place])  . Sleep apnea     Observations/Objective: A&O  No respiratory distress or wheezing audible over the phone Mood, judgement, and thought processes all WNL  Assessment and Plan: 1. Tooth infection - Encouraged warm salt water gargles and NSAIDs.  Patient understands that if she does not have improvement by Monday she needs to seek dental care ASAP. - amoxicillin (AMOXIL) 500 MG capsule; Take 1,000 mg (2 capsules) by mouth today, then 500 mg every 8 hours x3 days.  Dispense: 11 capsule; Refill: 0   Follow Up Instructions:  I discussed the assessment and treatment plan with the patient. The patient was provided an opportunity to ask questions and all were answered. The patient agreed with the plan and demonstrated an understanding of the instructions.   The patient was advised to call back or seek an in-person evaluation if the symptoms worsen or if the condition fails to improve as anticipated.  The above assessment and management plan was discussed with the patient. The patient verbalized understanding of and has agreed to the management plan. Patient is aware to call the clinic if symptoms persist or worsen. Patient is aware when to return to the clinic for a follow-up visit. Patient educated on when it is appropriate to go to the emergency department.   Time call ended: 4:20 PM  I provided 10 minutes of non-face-to-face time during this encounter.  [name], MSN, APRN, FNP-C [facility] 09/19/19

## 2019-09-24 DIAGNOSIS — Z7689 Persons encountering health services in other specified circumstances: Secondary | ICD-10-CM | POA: Diagnosis not present

## 2019-10-03 ENCOUNTER — Other Ambulatory Visit: Payer: Self-pay | Admitting: Family Medicine

## 2019-10-03 DIAGNOSIS — E785 Hyperlipidemia, unspecified: Secondary | ICD-10-CM

## 2019-10-08 ENCOUNTER — Other Ambulatory Visit: Payer: Self-pay | Admitting: Family Medicine

## 2019-10-14 DIAGNOSIS — F909 Attention-deficit hyperactivity disorder, unspecified type: Secondary | ICD-10-CM | POA: Diagnosis not present

## 2019-10-16 ENCOUNTER — Telehealth: Payer: Self-pay | Admitting: Family Medicine

## 2019-10-17 NOTE — Telephone Encounter (Signed)
I will await this call.

## 2019-10-22 DIAGNOSIS — M47816 Spondylosis without myelopathy or radiculopathy, lumbar region: Secondary | ICD-10-CM | POA: Diagnosis not present

## 2019-10-22 DIAGNOSIS — Z7689 Persons encountering health services in other specified circumstances: Secondary | ICD-10-CM | POA: Diagnosis not present

## 2019-10-22 DIAGNOSIS — Z6825 Body mass index (BMI) 25.0-25.9, adult: Secondary | ICD-10-CM | POA: Diagnosis not present

## 2019-10-24 DIAGNOSIS — R32 Unspecified urinary incontinence: Secondary | ICD-10-CM | POA: Diagnosis not present

## 2019-10-30 ENCOUNTER — Encounter: Payer: Self-pay | Admitting: Family Medicine

## 2019-11-20 DIAGNOSIS — F909 Attention-deficit hyperactivity disorder, unspecified type: Secondary | ICD-10-CM | POA: Diagnosis not present

## 2019-11-28 ENCOUNTER — Telehealth: Payer: Self-pay | Admitting: Family Medicine

## 2019-11-28 NOTE — Telephone Encounter (Signed)
Left message to call back  

## 2019-11-28 NOTE — Telephone Encounter (Signed)
Please call patient and follow-up on cologuard that was ordered. Encourage patient to complete and return.   Also, I sent her a MyChart message last month but it has not been viewed regarding her mammogram. She was previously scheduled to get a mammogram on the bus that comes to the clinic but I do not see that this was completed. Would she like to get this rescheduled or complete this somewhere else?

## 2019-12-01 ENCOUNTER — Other Ambulatory Visit: Payer: Self-pay | Admitting: Physician Assistant

## 2019-12-01 ENCOUNTER — Other Ambulatory Visit: Payer: Self-pay | Admitting: Family Medicine

## 2019-12-01 DIAGNOSIS — F32A Depression, unspecified: Secondary | ICD-10-CM

## 2019-12-01 DIAGNOSIS — F329 Major depressive disorder, single episode, unspecified: Secondary | ICD-10-CM

## 2019-12-01 DIAGNOSIS — E119 Type 2 diabetes mellitus without complications: Secondary | ICD-10-CM

## 2019-12-03 DIAGNOSIS — F331 Major depressive disorder, recurrent, moderate: Secondary | ICD-10-CM | POA: Diagnosis not present

## 2019-12-06 NOTE — Telephone Encounter (Signed)
Got patient reschedule on bus. She has kit but has not had a chance to do it yet. FYI

## 2019-12-09 ENCOUNTER — Other Ambulatory Visit: Payer: Self-pay | Admitting: Family Medicine

## 2019-12-09 DIAGNOSIS — E119 Type 2 diabetes mellitus without complications: Secondary | ICD-10-CM

## 2019-12-11 DIAGNOSIS — Z5181 Encounter for therapeutic drug level monitoring: Secondary | ICD-10-CM | POA: Diagnosis not present

## 2019-12-12 NOTE — Progress Notes (Signed)
Virtual Visit via Telephone Note  I connected with Kimberly Green on 12/15/19 at 11:17 AM by telephone and verified that I am speaking with the correct person using two identifiers. Kimberly Green is currently located at home and noody is currently with her during this visit. The provider, Loman Brooklyn, FNP is located in their office at time of visit.  I discussed the limitations, risks, security and privacy concerns of performing an evaluation and management service by telephone and the availability of in person appointments. I also discussed with the patient that there may be a patient responsible charge related to this service. The patient expressed understanding and agreed to proceed.  Subjective: PCP: Loman Brooklyn, FNP  Chief Complaint  Patient presents with  . Diabetes   Diabetes: Patient presents for follow up of diabetes. Current symptoms include: none. Known diabetic complications: none. Medication compliance: yes. Current diet: in general, a "healthy" diet   - sometimes slips with cookies with her grandchildren; Pepsi is her weakness. Current exercise: none. Home blood sugar records: BGs range between 95 and 150. Is she  on ACE inhibitor or angiotensin II receptor blocker? No. Is she on a statin? Yes.   Lab Results  Component Value Date   HGBA1C 7.1 (H) 09/12/2019   HGBA1C 7.8 (H) 11/28/2018   HGBA1C 7.1 (H) 08/17/2018   Lab Results  Component Value Date   LDLCALC Comment 05/08/2019   CREATININE 0.68 05/08/2019    ROS: Per HPI  Current Outpatient Medications:  .  albuterol (PROVENTIL HFA;VENTOLIN HFA) 108 (90 Base) MCG/ACT inhaler, Inhale 1-2 puffs into the lungs every 6 (six) hours as needed for wheezing or shortness of breath., Disp: 1 Inhaler, Rfl: 0 .  albuterol (PROVENTIL) (2.5 MG/3ML) 0.083% nebulizer solution, INHALE THE CONTENTS OF ONE VIAL (3ML) VIA NEBULIZER EVERY 6 HOURS AS NEEDED, Disp: 90 mL, Rfl: 2 .  amphetamine-dextroamphetamine  (ADDERALL) 10 MG tablet, TAKE 1 TABLET BY MOUTH EVERY MORNING AND TAKE 1 TABLET BY MOUTH EVERY DAY AT NOON - MAY FILL IN JULY, Disp: , Rfl: 0 .  atorvastatin (LIPITOR) 20 MG tablet, TAKE 1 TABLET BY MOUTH EVERY DAY AT 6PM, Disp: 30 tablet, Rfl: 5 .  cetirizine (ZYRTEC) 10 MG tablet, Take 10 mg by mouth daily., Disp: , Rfl:  .  FARXIGA 10 MG TABS tablet, TAKE 1 TABLET BY MOUTH DAILY, Disp: 30 tablet, Rfl: 2 .  fluticasone (FLOVENT HFA) 110 MCG/ACT inhaler, Inhale 2 puffs into the lungs 2 (two) times daily., Disp: 1 Inhaler, Rfl: 12 .  gabapentin (NEURONTIN) 300 MG capsule, TAKE ONE CAPSULE BY MOUTH THREE TIMES DAILY, Disp: 90 capsule, Rfl: 0 .  glimepiride (AMARYL) 1 MG tablet, TAKE 1 TABLET BY MOUTH EVERY DAY with BREAKFAST, Disp: 30 tablet, Rfl: 2 .  glucose blood (ACCU-CHEK AVIVA PLUS) test strip, Use to check BG twice daily, Disp: 100 each, Rfl: 2 .  Lancets (ACCU-CHEK SOFT TOUCH) lancets, Use to check BG twice a day, Disp: 100 each, Rfl: 2 .  lisinopril (ZESTRIL) 2.5 MG tablet, Take 1 tablet (2.5 mg total) by mouth daily., Disp: 90 tablet, Rfl: 1 .  metFORMIN (GLUCOPHAGE) 1000 MG tablet, TAKE 1 TABLET BY MOUTH TWICE DAILY WITH A MEAL, Disp: 180 tablet, Rfl: 0 .  QUEtiapine (SEROQUEL) 100 MG tablet, TAKE TWO TABLETS BY MOUTH AT BEDTIME, Disp: 60 tablet, Rfl: 2  Allergies  Allergen Reactions  . Lexiscan [Regadenoson] Shortness Of Breath    Severe transient respiratory distress  Past Medical History:  Diagnosis Date  . Adrenal tumor, benign    S/P L adrenal resection, 10/11 ([place])  . Bipolar disorder ([place])   . Cervical cancer ([place]) 1987  . Cholelithiasis   . COPD (chronic obstructive pulmonary disease) ([place])   . Diabetes mellitus without complication ([place])   . Essential hypertension, benign   . IBS (irritable bowel syndrome)   . Mixed hyperlipidemia   . PTSD (post-traumatic stress disorder)   . Renal carcinoma ([place])    S/P partial R nephrectomy (1/3 removed), 8/11 ([place])  . Sleep  apnea     Observations/Objective: A&O  No respiratory distress or wheezing audible over the phone Mood, judgement, and thought processes all WNL  Assessment and Plan: 1. Diabetes mellitus without complication ([place]) Lab Results  Component Value Date   HGBA1C 7.1 (H) 09/12/2019   HGBA1C 7.8 (H) 11/28/2018   HGBA1C 7.1 (H) 08/17/2018  - Patient is going to come in on Monday or Tuesday of next week to have her A1c and lab work completed. - Medications: continue current medications; will adjust if needed after A1c results next week - Home glucose monitoring: continue as needed - Patient is currently taking a statin. Patient is taking an ACE-inhibitor/ARB.  - Last foot exam: 11/28/2018 - Last diabetic eye exam: unknown - Urine Microalbumin/Creat Ratio: 05/08/2019 - Instruction/counseling given: reminded to get eye exam and discussed diet - Bayer DCA Hb A1c Waived; Future - lisinopril (ZESTRIL) 2.5 MG tablet; Take 1 tablet (2.5 mg total) by mouth daily.  Dispense: 90 tablet; Refill: 1   Follow Up Instructions: Return as directed after labs result.  I discussed the assessment and treatment plan with the patient. The patient was provided an opportunity to ask questions and all were answered. The patient agreed with the plan and demonstrated an understanding of the instructions.   The patient was advised to call back or seek an in-person evaluation if the symptoms worsen or if the condition fails to improve as anticipated.  The above assessment and management plan was discussed with the patient. The patient verbalized understanding of and has agreed to the management plan. Patient is aware to call the clinic if symptoms persist or worsen. Patient is aware when to return to the clinic for a follow-up visit. Patient educated on when it is appropriate to go to the emergency department.   Time call ended: 11:27 AM  I provided 13 minutes of non-face-to-face time during this encounter.  [name], MSN, APRN, FNP-C [facility] 12/15/19

## 2019-12-13 ENCOUNTER — Ambulatory Visit (INDEPENDENT_AMBULATORY_CARE_PROVIDER_SITE_OTHER): Payer: Medicaid Other | Admitting: Family Medicine

## 2019-12-13 DIAGNOSIS — E119 Type 2 diabetes mellitus without complications: Secondary | ICD-10-CM

## 2019-12-13 MED ORDER — LISINOPRIL 2.5 MG PO TABS: 2.5000 mg | ORAL_TABLET | Freq: Every day | ORAL | 1 refills | Status: DC

## 2019-12-15 ENCOUNTER — Encounter: Payer: Self-pay | Admitting: Family Medicine

## 2019-12-18 DIAGNOSIS — F909 Attention-deficit hyperactivity disorder, unspecified type: Secondary | ICD-10-CM | POA: Diagnosis not present

## 2019-12-18 DIAGNOSIS — F331 Major depressive disorder, recurrent, moderate: Secondary | ICD-10-CM | POA: Diagnosis not present

## 2019-12-18 DIAGNOSIS — Z5181 Encounter for therapeutic drug level monitoring: Secondary | ICD-10-CM | POA: Diagnosis not present

## 2019-12-25 ENCOUNTER — Ambulatory Visit (INDEPENDENT_AMBULATORY_CARE_PROVIDER_SITE_OTHER): Payer: Medicaid Other | Admitting: Family Medicine

## 2019-12-25 ENCOUNTER — Encounter: Payer: Self-pay | Admitting: Family Medicine

## 2019-12-25 DIAGNOSIS — G4733 Obstructive sleep apnea (adult) (pediatric): Secondary | ICD-10-CM | POA: Diagnosis not present

## 2019-12-25 DIAGNOSIS — R269 Unspecified abnormalities of gait and mobility: Secondary | ICD-10-CM | POA: Diagnosis not present

## 2019-12-25 DIAGNOSIS — Z5181 Encounter for therapeutic drug level monitoring: Secondary | ICD-10-CM | POA: Diagnosis not present

## 2019-12-25 DIAGNOSIS — R2681 Unsteadiness on feet: Secondary | ICD-10-CM | POA: Diagnosis not present

## 2019-12-25 NOTE — Progress Notes (Signed)
Virtual Visit via Telephone Note  I connected with Kimberly Green on 12/29/19 at 3:35 PM by telephone and verified that I am speaking with the correct person using two identifiers. Kimberly Green is currently located at home and nobody is currently with her during this visit. The provider, Kimberly Brooklyn, FNP is located in their home at time of visit.  I discussed the limitations, risks, security and privacy concerns of performing an evaluation and management service by telephone and the availability of in person appointments. I also discussed with the patient that there may be a patient responsible charge related to this service. The patient expressed understanding and agreed to proceed.  Subjective: PCP: Kimberly Brooklyn, FNP  Chief Complaint  Patient presents with  . Back Pain   Patient is requesting a rolling walker, shower seat, and handicap placard.  An order for the rolling walker and shower seat was sent in to [facility] on 09/12/2019.  Patient reports that they killed the nerve that is damaged so that she is no longer having pain but that her leg still collapses and she has a hard time walking.   ROS: Per HPI  Current Outpatient Medications:  .  albuterol (PROVENTIL HFA;VENTOLIN HFA) 108 (90 Base) MCG/ACT inhaler, Inhale 1-2 puffs into the lungs every 6 (six) hours as needed for wheezing or shortness of breath., Disp: 1 Inhaler, Rfl: 0 .  albuterol (PROVENTIL) (2.5 MG/3ML) 0.083% nebulizer solution, INHALE THE CONTENTS OF ONE VIAL (3ML) VIA NEBULIZER EVERY 6 HOURS AS NEEDED, Disp: 90 mL, Rfl: 2 .  amphetamine-dextroamphetamine (ADDERALL) 10 MG tablet, TAKE 1 TABLET BY MOUTH EVERY MORNING AND TAKE 1 TABLET BY MOUTH EVERY DAY AT NOON - MAY FILL IN JULY, Disp: , Rfl: 0 .  atorvastatin (LIPITOR) 20 MG tablet, TAKE 1 TABLET BY MOUTH EVERY DAY AT 6PM, Disp: 30 tablet, Rfl: 5 .  cetirizine (ZYRTEC) 10 MG tablet, Take 10 mg by mouth daily., Disp: , Rfl:  .  FARXIGA 10 MG TABS  tablet, TAKE 1 TABLET BY MOUTH DAILY, Disp: 30 tablet, Rfl: 2 .  fluticasone (FLOVENT HFA) 110 MCG/ACT inhaler, Inhale 2 puffs into the lungs 2 (two) times daily., Disp: 1 Inhaler, Rfl: 12 .  gabapentin (NEURONTIN) 300 MG capsule, TAKE ONE CAPSULE BY MOUTH THREE TIMES DAILY, Disp: 90 capsule, Rfl: 0 .  glimepiride (AMARYL) 1 MG tablet, TAKE 1 TABLET BY MOUTH EVERY DAY with BREAKFAST, Disp: 30 tablet, Rfl: 2 .  glucose blood (ACCU-CHEK AVIVA PLUS) test strip, Use to check BG twice daily, Disp: 100 each, Rfl: 2 .  Lancets (ACCU-CHEK SOFT TOUCH) lancets, Use to check BG twice a day, Disp: 100 each, Rfl: 2 .  lisinopril (ZESTRIL) 2.5 MG tablet, Take 1 tablet (2.5 mg total) by mouth daily., Disp: 90 tablet, Rfl: 1 .  metFORMIN (GLUCOPHAGE) 1000 MG tablet, TAKE 1 TABLET BY MOUTH TWICE DAILY WITH A MEAL, Disp: 180 tablet, Rfl: 0 .  QUEtiapine (SEROQUEL) 100 MG tablet, TAKE TWO TABLETS BY MOUTH AT BEDTIME, Disp: 60 tablet, Rfl: 2  Allergies  Allergen Reactions  . Lexiscan [Regadenoson] Shortness Of Breath    Severe transient respiratory distress   Past Medical History:  Diagnosis Date  . Adrenal tumor, benign    S/P L adrenal resection, 10/11 ([place])  . Bipolar disorder ([place])   . Cervical cancer ([place]) 1987  . Cholelithiasis   . COPD (chronic obstructive pulmonary disease) ([place])   . Diabetes mellitus without complication ([place])   .  Essential hypertension, benign   . IBS (irritable bowel syndrome)   . Mixed hyperlipidemia   . PTSD (post-traumatic stress disorder)   . Renal carcinoma ([place])    S/P partial R nephrectomy (1/3 removed), 8/11 ([place])  . Sleep apnea     Observations/Objective: A&O  No respiratory distress or wheezing audible over the phone Mood, judgement, and thought processes all WNL  Assessment and Plan: 1. Impaired gait - Encouraged to call [facility] to follow-up on shower chair and rolling walker.  We will get her a handicap placard.   Follow Up Instructions:  I discussed  the assessment and treatment plan with the patient. The patient was provided an opportunity to ask questions and all were answered. The patient agreed with the plan and demonstrated an understanding of the instructions.   The patient was advised to call back or seek an in-person evaluation if the symptoms worsen or if the condition fails to improve as anticipated.  The above assessment and management plan was discussed with the patient. The patient verbalized understanding of and has agreed to the management plan. Patient is aware to call the clinic if symptoms persist or worsen. Patient is aware when to return to the clinic for a follow-up visit. Patient educated on when it is appropriate to go to the emergency department.   Time call ended: 3:41 PM  I provided 8 minutes of non-face-to-face time during this encounter.  [name], MSN, APRN, FNP-C [facility] 12/29/19

## 2020-01-08 DIAGNOSIS — Z5181 Encounter for therapeutic drug level monitoring: Secondary | ICD-10-CM | POA: Diagnosis not present

## 2020-01-09 ENCOUNTER — Other Ambulatory Visit: Payer: Self-pay | Admitting: *Deleted

## 2020-01-09 MED ORDER — GABAPENTIN 300 MG PO CAPS: 300.0000 mg | ORAL_CAPSULE | Freq: Three times a day (TID) | ORAL | 1 refills | Status: DC

## 2020-01-22 DIAGNOSIS — F331 Major depressive disorder, recurrent, moderate: Secondary | ICD-10-CM | POA: Diagnosis not present

## 2020-01-22 DIAGNOSIS — F909 Attention-deficit hyperactivity disorder, unspecified type: Secondary | ICD-10-CM | POA: Diagnosis not present

## 2020-02-05 DIAGNOSIS — F331 Major depressive disorder, recurrent, moderate: Secondary | ICD-10-CM | POA: Diagnosis not present

## 2020-02-05 DIAGNOSIS — Z5181 Encounter for therapeutic drug level monitoring: Secondary | ICD-10-CM | POA: Diagnosis not present

## 2020-02-07 ENCOUNTER — Telehealth: Payer: Self-pay | Admitting: Family Medicine

## 2020-02-07 NOTE — Telephone Encounter (Signed)
pt has appt on 02/14/20, when ppw was faxed she had enough supplies and stated she would wait till appt

## 2020-02-10 ENCOUNTER — Telehealth: Payer: Self-pay | Admitting: Family Medicine

## 2020-02-10 NOTE — Telephone Encounter (Signed)
  Prescription Request  02/10/2020  What is the name of the medication or equipment? DUO-NEB Nebulizer  Have you contacted your pharmacy to request a refill? (if applicable) No, she thinks she got this at ER before, the albuterol irritates her throat and with her allergies the DUP-NEB seems to help better Which pharmacy would you like this sent to? [facility]   Patient notified that their request is being sent to the clinical staff for review and that they should receive a response within 2 business days.

## 2020-02-11 MED ORDER — IPRATROPIUM-ALBUTEROL 0.5-2.5 (3) MG/3ML IN SOLN: 3.0000 mL | Freq: Four times a day (QID) | RESPIRATORY_TRACT | 2 refills | Status: DC | PRN

## 2020-02-11 NOTE — Telephone Encounter (Signed)
Is she asking for a nebulizer or the duo-neb solution?

## 2020-02-11 NOTE — Telephone Encounter (Signed)
Patient needs the Duo-neb solution, she already has a nebulizer.

## 2020-02-11 NOTE — Telephone Encounter (Signed)
Ordered

## 2020-02-14 ENCOUNTER — Ambulatory Visit: Payer: Medicaid Other | Admitting: Family Medicine

## 2020-02-14 ENCOUNTER — Ambulatory Visit (INDEPENDENT_AMBULATORY_CARE_PROVIDER_SITE_OTHER): Payer: Medicaid Other | Admitting: Nurse Practitioner

## 2020-02-14 ENCOUNTER — Other Ambulatory Visit: Payer: Self-pay

## 2020-02-14 DIAGNOSIS — J441 Chronic obstructive pulmonary disease with (acute) exacerbation: Secondary | ICD-10-CM

## 2020-02-14 MED ORDER — AMOXICILLIN-POT CLAVULANATE 875-125 MG PO TABS: 1.0000 | ORAL_TABLET | Freq: Two times a day (BID) | ORAL | 0 refills | Status: DC

## 2020-02-14 MED ORDER — PREDNISONE 10 MG (21) PO TBPK: ORAL_TABLET | ORAL | 0 refills | Status: DC

## 2020-02-14 NOTE — Progress Notes (Signed)
Virtual Visit via telephone Note Due to COVID-19 pandemic this visit was conducted virtually. This visit type was conducted due to national recommendations for restrictions regarding the COVID-19 Pandemic (e.g. social distancing, sheltering in place) in an effort to limit this patient's exposure and mitigate transmission in our community. All issues noted in this document were discussed and addressed.  A physical exam was not performed with this format.  I connected with Kimberly Green on 02/14/20 at 12:35 by telephone and verified that I am speaking with the correct person using two identifiers. Kimberly Green is currently located at home and npo one is currently with  her during visit. The provider, Mary-Margaret Hassell Done, FNP is located in their office at time of visit.  I discussed the limitations, risks, security and privacy concerns of performing an evaluation and management service by telephone and the availability of in person appointments. I also discussed with the patient that there may be a patient responsible charge related to this service. The patient expressed understanding and agreed to proceed.   History and Present Illness:   Chief Complaint: Cough   HPI Patient call in today c/o cough and congetion. She get thievery year. Her cough is productive. This started over 2 weeks ago and has gradually worsened.   Review of Systems  Constitutional: Negative for chills and fever.  HENT: Positive for congestion and sore throat.   Respiratory: Positive for cough (productive).   Neurological: Positive for headaches. Negative for dizziness.     Observations/Objective: Alert and oriented- answers all questions appropriately No distress Voice hoarse Wet cough   Assessment and Plan: Kimberly Green in today with chief complaint of Cough   1. Chronic obstructive pulmonary disease with acute exacerbation ([place])  1. Take meds as prescribed 2. Use a cool mist  humidifier especially during the winter months and when heat has been humid. 3. Use saline nose sprays frequently 4. Saline irrigations of the nose can be very helpful if done frequently.  * 4X daily for 1 week*  * Use of a nettie pot can be helpful with this. Follow directions with this* 5. Drink plenty of fluids 6. Keep thermostat turn down low 7.For any cough or congestion  Use plain Mucinex- regular strength or max strength is fine   * Children- consult with Pharmacist for dosing 8. For fever or aces or pains- take tylenol or ibuprofen appropriate for age and weight.  * for fevers greater than 101 orally you may alternate ibuprofen and tylenol every  3 hours.   Meds ordered this encounter  Medications  . predniSONE (STERAPRED UNI-PAK 21 TAB) 10 MG (21) TBPK tablet    Sig: As directed x 6 days    Dispense:  21 tablet    Refill:  0    Order Specific Question:   Supervising Provider    Answer:   Caryl Pina A [id number]  . amoxicillin-clavulanate (AUGMENTIN) 875-125 MG tablet    Sig: Take 1 tablet by mouth 2 (two) times daily.    Dispense:  20 tablet    Refill:  0    Order Specific Question:   Supervising Provider    Answer:   Caryl Pina A [id number]   Follow Up Instructions: prn   I discussed the assessment and treatment plan with the patient. The patient was provided an opportunity to ask questions and all were answered. The patient agreed with the plan and demonstrated an understanding of the instructions.   The patient was  advised to call back or seek an in-person evaluation if the symptoms worsen or if the condition fails to improve as anticipated.  The above assessment and management plan was discussed with the patient. The patient verbalized understanding of and has agreed to the management plan. Patient is aware to call the clinic if symptoms persist or worsen. Patient is aware when to return to the clinic for a follow-up visit. Patient educated on when it is  appropriate to go to the emergency department.   Time call ended:  12:47  I provided 12 minutes of non-face-to-face time during this encounter.    Mary-Margaret Hassell Done, FNP

## 2020-02-19 DIAGNOSIS — F331 Major depressive disorder, recurrent, moderate: Secondary | ICD-10-CM | POA: Diagnosis not present

## 2020-02-19 DIAGNOSIS — Z1159 Encounter for screening for other viral diseases: Secondary | ICD-10-CM | POA: Diagnosis not present

## 2020-02-19 DIAGNOSIS — Z5181 Encounter for therapeutic drug level monitoring: Secondary | ICD-10-CM | POA: Diagnosis not present

## 2020-02-27 ENCOUNTER — Other Ambulatory Visit: Payer: Self-pay | Admitting: Family Medicine

## 2020-02-27 DIAGNOSIS — E119 Type 2 diabetes mellitus without complications: Secondary | ICD-10-CM

## 2020-02-27 DIAGNOSIS — F32A Depression, unspecified: Secondary | ICD-10-CM

## 2020-03-04 ENCOUNTER — Other Ambulatory Visit: Payer: Self-pay | Admitting: *Deleted

## 2020-03-04 DIAGNOSIS — F331 Major depressive disorder, recurrent, moderate: Secondary | ICD-10-CM | POA: Diagnosis not present

## 2020-03-04 DIAGNOSIS — E119 Type 2 diabetes mellitus without complications: Secondary | ICD-10-CM

## 2020-03-04 MED ORDER — METFORMIN HCL 1000 MG PO TABS: ORAL_TABLET | ORAL | 0 refills | Status: DC

## 2020-03-06 DIAGNOSIS — Z5181 Encounter for therapeutic drug level monitoring: Secondary | ICD-10-CM | POA: Diagnosis not present

## 2020-03-12 DIAGNOSIS — R32 Unspecified urinary incontinence: Secondary | ICD-10-CM | POA: Diagnosis not present

## 2020-03-23 DIAGNOSIS — F331 Major depressive disorder, recurrent, moderate: Secondary | ICD-10-CM | POA: Diagnosis not present

## 2020-03-24 DIAGNOSIS — Z5181 Encounter for therapeutic drug level monitoring: Secondary | ICD-10-CM | POA: Diagnosis not present

## 2020-03-25 ENCOUNTER — Other Ambulatory Visit: Payer: Self-pay | Admitting: *Deleted

## 2020-03-25 ENCOUNTER — Other Ambulatory Visit: Payer: Self-pay | Admitting: Family Medicine

## 2020-03-25 DIAGNOSIS — E119 Type 2 diabetes mellitus without complications: Secondary | ICD-10-CM

## 2020-03-25 DIAGNOSIS — E785 Hyperlipidemia, unspecified: Secondary | ICD-10-CM

## 2020-03-25 MED ORDER — DAPAGLIFLOZIN PROPANEDIOL 10 MG PO TABS: 10.0000 mg | ORAL_TABLET | Freq: Every day | ORAL | 0 refills | Status: DC

## 2020-03-25 MED ORDER — GABAPENTIN 300 MG PO CAPS: 300.0000 mg | ORAL_CAPSULE | Freq: Three times a day (TID) | ORAL | 0 refills | Status: DC

## 2020-04-13 ENCOUNTER — Other Ambulatory Visit: Payer: Self-pay | Admitting: Family Medicine

## 2020-04-13 DIAGNOSIS — E119 Type 2 diabetes mellitus without complications: Secondary | ICD-10-CM

## 2020-04-15 ENCOUNTER — Ambulatory Visit: Payer: Medicaid Other | Admitting: Family Medicine

## 2020-04-16 ENCOUNTER — Encounter: Payer: Self-pay | Admitting: Family Medicine

## 2020-04-26 ENCOUNTER — Other Ambulatory Visit: Payer: Self-pay | Admitting: Family Medicine

## 2020-04-26 DIAGNOSIS — E119 Type 2 diabetes mellitus without complications: Secondary | ICD-10-CM

## 2020-05-26 ENCOUNTER — Other Ambulatory Visit: Payer: Self-pay | Admitting: Family Medicine

## 2020-05-26 DIAGNOSIS — F32A Depression, unspecified: Secondary | ICD-10-CM

## 2020-05-26 DIAGNOSIS — E119 Type 2 diabetes mellitus without complications: Secondary | ICD-10-CM

## 2020-05-26 DIAGNOSIS — F329 Major depressive disorder, single episode, unspecified: Secondary | ICD-10-CM

## 2020-06-05 ENCOUNTER — Other Ambulatory Visit: Payer: Self-pay | Admitting: Family Medicine

## 2020-06-05 DIAGNOSIS — E119 Type 2 diabetes mellitus without complications: Secondary | ICD-10-CM

## 2020-06-23 ENCOUNTER — Encounter: Payer: Self-pay | Admitting: Family Medicine

## 2020-06-23 ENCOUNTER — Encounter: Payer: Medicaid Other | Admitting: Family Medicine

## 2020-06-28 ENCOUNTER — Other Ambulatory Visit: Payer: Self-pay | Admitting: Family Medicine

## 2020-06-28 DIAGNOSIS — E785 Hyperlipidemia, unspecified: Secondary | ICD-10-CM

## 2020-06-28 DIAGNOSIS — E119 Type 2 diabetes mellitus without complications: Secondary | ICD-10-CM

## 2020-06-29 MED ORDER — ATORVASTATIN CALCIUM 20 MG PO TABS: 20.0000 mg | ORAL_TABLET | Freq: Every day | ORAL | 0 refills | Status: DC

## 2020-06-29 MED ORDER — DAPAGLIFLOZIN PROPANEDIOL 10 MG PO TABS: ORAL_TABLET | ORAL | 0 refills | Status: DC

## 2020-06-29 MED ORDER — GLIMEPIRIDE 1 MG PO TABS: ORAL_TABLET | ORAL | 2 refills | Status: DC

## 2020-06-29 MED ORDER — GLIMEPIRIDE 1 MG PO TABS: ORAL_TABLET | ORAL | 0 refills | Status: DC

## 2020-06-29 MED ORDER — GABAPENTIN 300 MG PO CAPS: 300.0000 mg | ORAL_CAPSULE | Freq: Three times a day (TID) | ORAL | 0 refills | Status: DC

## 2020-06-29 NOTE — Addendum Note (Signed)
Addended by: Lanier Prude D on: 06/29/2020 11:51 AM   Modules accepted: Orders

## 2020-06-29 NOTE — Telephone Encounter (Signed)
Appt made for 07/21/2020 with [place] - sent in 15 days to last until appt

## 2020-06-29 NOTE — Telephone Encounter (Signed)
Kimberly Green NTBS 30 days given 06/08/20

## 2020-07-04 ENCOUNTER — Other Ambulatory Visit: Payer: Self-pay | Admitting: Family Medicine

## 2020-07-04 DIAGNOSIS — E119 Type 2 diabetes mellitus without complications: Secondary | ICD-10-CM

## 2020-07-20 ENCOUNTER — Ambulatory Visit: Payer: Medicaid Other | Admitting: Nurse Practitioner

## 2020-07-20 ENCOUNTER — Encounter: Payer: Self-pay | Admitting: Nurse Practitioner

## 2020-07-20 ENCOUNTER — Other Ambulatory Visit: Payer: Self-pay

## 2020-07-20 VITALS — BP 120/80 | HR 106 | Temp 98.1°F | Ht 64.0 in | Wt 248.2 lb

## 2020-07-20 DIAGNOSIS — I152 Hypertension secondary to endocrine disorders: Secondary | ICD-10-CM | POA: Diagnosis not present

## 2020-07-20 DIAGNOSIS — J441 Chronic obstructive pulmonary disease with (acute) exacerbation: Secondary | ICD-10-CM | POA: Diagnosis not present

## 2020-07-20 DIAGNOSIS — E1169 Type 2 diabetes mellitus with other specified complication: Secondary | ICD-10-CM

## 2020-07-20 DIAGNOSIS — F32A Depression, unspecified: Secondary | ICD-10-CM

## 2020-07-20 DIAGNOSIS — E785 Hyperlipidemia, unspecified: Secondary | ICD-10-CM | POA: Diagnosis not present

## 2020-07-20 DIAGNOSIS — E1159 Type 2 diabetes mellitus with other circulatory complications: Secondary | ICD-10-CM | POA: Diagnosis not present

## 2020-07-20 DIAGNOSIS — R222 Localized swelling, mass and lump, trunk: Secondary | ICD-10-CM | POA: Diagnosis not present

## 2020-07-20 DIAGNOSIS — J449 Chronic obstructive pulmonary disease, unspecified: Secondary | ICD-10-CM

## 2020-07-20 DIAGNOSIS — E119 Type 2 diabetes mellitus without complications: Secondary | ICD-10-CM

## 2020-07-20 DIAGNOSIS — Z23 Encounter for immunization: Secondary | ICD-10-CM

## 2020-07-20 LAB — BAYER DCA HB A1C WAIVED: HB A1C (BAYER DCA - WAIVED): 6.4 % (ref <7.0)

## 2020-07-20 MED ORDER — ALBUTEROL SULFATE HFA 108 (90 BASE) MCG/ACT IN AERS: 1.0000 | INHALATION_SPRAY | Freq: Four times a day (QID) | RESPIRATORY_TRACT | 3 refills | Status: DC | PRN

## 2020-07-20 MED ORDER — DAPAGLIFLOZIN PROPANEDIOL 10 MG PO TABS: ORAL_TABLET | ORAL | 0 refills | Status: DC

## 2020-07-20 MED ORDER — ALBUTEROL SULFATE (2.5 MG/3ML) 0.083% IN NEBU: INHALATION_SOLUTION | RESPIRATORY_TRACT | 2 refills | Status: DC

## 2020-07-20 MED ORDER — CETIRIZINE HCL 10 MG PO TABS: 10.0000 mg | ORAL_TABLET | Freq: Every day | ORAL | 0 refills | Status: DC

## 2020-07-20 MED ORDER — METFORMIN HCL 1000 MG PO TABS: ORAL_TABLET | ORAL | 0 refills | Status: DC

## 2020-07-20 MED ORDER — LISINOPRIL 2.5 MG PO TABS: 2.5000 mg | ORAL_TABLET | Freq: Every day | ORAL | 0 refills | Status: DC

## 2020-07-20 MED ORDER — GLIMEPIRIDE 1 MG PO TABS: ORAL_TABLET | ORAL | 0 refills | Status: DC

## 2020-07-20 MED ORDER — ATORVASTATIN CALCIUM 20 MG PO TABS: 20.0000 mg | ORAL_TABLET | Freq: Every day | ORAL | 0 refills | Status: DC

## 2020-07-20 MED ORDER — GABAPENTIN 300 MG PO CAPS: 300.0000 mg | ORAL_CAPSULE | Freq: Three times a day (TID) | ORAL | 0 refills | Status: DC

## 2020-07-20 MED ORDER — QUETIAPINE FUMARATE 100 MG PO TABS: 200.0000 mg | ORAL_TABLET | Freq: Every day | ORAL | 2 refills | Status: DC

## 2020-07-20 MED ORDER — FLOVENT HFA 110 MCG/ACT IN AERO: 2.0000 | INHALATION_SPRAY | Freq: Two times a day (BID) | RESPIRATORY_TRACT | 3 refills | Status: DC

## 2020-07-20 MED ORDER — IPRATROPIUM-ALBUTEROL 0.5-2.5 (3) MG/3ML IN SOLN: 3.0000 mL | Freq: Four times a day (QID) | RESPIRATORY_TRACT | 2 refills | Status: DC | PRN

## 2020-07-20 NOTE — Progress Notes (Signed)
Established Patient Office Visit  Subjective:  Patient ID: Kimberly Green, female    DOB: August 13, 1961  Age: 59 y.o. MRN: [id number]  CC:  Chief Complaint  Patient presents with  . Medical Management of Chronic Issues  . Diabetes  . knot on back    HPI [employer] presents for  Diabetes Mellitus Type II, Follow-up  Lab Results  Component Value Date   HGBA1C 7.1 (H) 09/12/2019   HGBA1C 7.8 (H) 11/28/2018   HGBA1C 7.1 (H) 08/17/2018   Wt Readings from Last 3 Encounters:  07/20/20 248 lb 3.2 oz (112.6 kg)  09/12/19 249 lb (112.9 kg)  06/12/19 230 lb (104.3 kg)   Last seen for diabetes 1 year ago.  Management since then includes Metformin. She reports good compliance with treatment. She is not having side effects.  Symptoms: No fatigue No foot ulcerations  No appetite changes No nausea  No paresthesia of the feet  No polydipsia  No polyuria No visual disturbances   No vomiting     Home blood sugar records:  Patient did not bring CBG log to visit.  Episodes of hypoglycemia? No    Current insulin regiment: Metformin patient will get a referral Most Recent Eye Exam: We will schedule Current exercise: none Current diet habits: well balanced  Pertinent Labs: Lab Results  Component Value Date   CHOL 253 (H) 05/08/2019   HDL 29 (L) 05/08/2019   LDLCALC Comment 05/08/2019   TRIG 473 (H) 05/08/2019   CHOLHDL 8.7 (H) 05/08/2019   Lab Results  Component Value Date   NA 145 (H) 05/08/2019   K 3.9 05/08/2019   CREATININE 0.68 05/08/2019   GFRNONAA 98 05/08/2019   GFRAA 113 05/08/2019   GLUCOSE 140 (H) 05/08/2019      Depression: Patient complains of depression. She complains of depressed mood, fatigue and hopelessness. Onset was approximately 3 years ago, unchanged since that time.  She denies current suicidal and homicidal plan or intent.   Family history significant for no psychiatric illness.Possible organic causes contributing are: none.  Risk  factors: previous episode of depression Previous treatment includes Antidepressants and. She complains of the following side effects from the treatment: None therapeutic.  lump in the left upper shoulder  present in the  past few months. Patient denies any pain, or fever. Patient reports a similar lump in her right upper arm. Patient is concerned.   Mixed hyperlipidemia   Pt presents with hyperlipidemia. Patient was diagnosed in 11/17/2016.Marland Kitchen Compliance with treatment has been good; The patient is compliant with medications, maintains a low cholesterol diet , follows up as directed , and maintains an exercise regimen . The patient denies experiencing any hypercholesterolemia related symptoms. Current medication Lipitor 20 mg 1 tablet by mouth daily.   Pt presents for follow up of hypertension. Patient was diagnosed in __2/05/2017.__. The patient is tolerating the medication well without side effects. Compliance with treatment has been good; including taking medication as directed , maintains a healthy diet and regular exercise regimen , and following up as directed. Current medication lisinopril 2.5 mg tablet by mouth daily. ---------------------------------------------------------------------------------------------------    Past Medical History:  Diagnosis Date  . Adrenal tumor, benign    S/P L adrenal resection, 10/11 ([place])  . Bipolar disorder ([place])   . Cervical cancer ([place]) 1987  . Cholelithiasis   . COPD (chronic obstructive pulmonary disease) ([place])   . Diabetes mellitus without complication ([place])   . Essential hypertension, benign   .  IBS (irritable bowel syndrome)   . Mixed hyperlipidemia   . PTSD (post-traumatic stress disorder)   . Renal carcinoma ([place])    S/P partial R nephrectomy (1/3 removed), 8/11 ([place])  . Sleep apnea     Past Surgical History:  Procedure Laterality Date  . ADRENAL GLAND SURGERY    . BACK SURGERY    . CERVICAL CONE BIOPSY     cervical cancer  .  CHOLECYSTECTOMY    . KIDNEY MASS REMOVAL    . NECK SURGERY    . RIGHT WRIST SURGERY    . SHOULDER SURGERY Left   . TUBAL LIGATION      Family History  Problem Relation Age of Onset  . Coronary artery disease Mother 41  . COPD Mother   . Hypertension Mother   . Breast cancer Mother   . Uterine cancer Mother   . Heart failure Father 12  . Heart disease Father   . Colon cancer Other        Aunt  . Lung cancer Other        Aunt  . Diabetes Sister   . Heart disease Sister   . Heart failure Maternal Grandmother   . Heart failure Paternal Grandmother   . Cerebral palsy Brother   . Early death Brother        MVA  . Diabetes Sister   . Heart disease Sister   . Valvular heart disease Sister   . Early death Sister        Murdered  . Uterine cancer Daughter       Outpatient Medications Prior to Visit  Medication Sig Dispense Refill  . albuterol (PROVENTIL HFA;VENTOLIN HFA) 108 (90 Base) MCG/ACT inhaler Inhale 1-2 puffs into the lungs every 6 (six) hours as needed for wheezing or shortness of breath. 1 Inhaler 0  . albuterol (PROVENTIL) (2.5 MG/3ML) 0.083% nebulizer solution INHALE THE CONTENTS OF ONE VIAL (3ML) VIA NEBULIZER EVERY 6 HOURS AS NEEDED 90 mL 2  . amphetamine-dextroamphetamine (ADDERALL) 10 MG tablet TAKE 1 TABLET BY MOUTH EVERY MORNING AND TAKE 1 TABLET BY MOUTH EVERY DAY AT NOON - MAY FILL IN JULY  0  . atorvastatin (LIPITOR) 20 MG tablet Take 1 tablet (20 mg total) by mouth daily. Must have follow for further refills 15 tablet 0  . cetirizine (ZYRTEC) 10 MG tablet Take 10 mg by mouth daily.    . dapagliflozin propanediol (FARXIGA) 10 MG TABS tablet TAKE 1 TABLET BY MOUTH EVERY DAY - NEEDS APPOINTMENT BEFORE NEXT FILL 15 tablet 0  . fluticasone (FLOVENT HFA) 110 MCG/ACT inhaler Inhale 2 puffs into the lungs 2 (two) times daily. 1 Inhaler 12  . gabapentin (NEURONTIN) 300 MG capsule Take 1 capsule (300 mg total) by mouth 3 (three) times daily. Must have appoint for  further refills 45 capsule 0  . glimepiride (AMARYL) 1 MG tablet TAKE 1 TABLET BY MOUTH EVERY DAY with breakfast Must have appointment for further refills 15 tablet 0  . glucose blood (ACCU-CHEK AVIVA PLUS) test strip Use to check BG twice daily 100 each 2  . ipratropium-albuterol (DUONEB) 0.5-2.5 (3) MG/3ML SOLN Take 3 mLs by nebulization every 6 (six) hours as needed. 360 mL 2  . Lancets (ACCU-CHEK SOFT TOUCH) lancets Use to check BG twice a day 100 each 2  . lisinopril (ZESTRIL) 2.5 MG tablet TAKE 1 TABLET BY MOUTH DAILY 90 tablet 0  . metFORMIN (GLUCOPHAGE) 1000 MG tablet TAKE 1 TABLET BY MOUTH TWICE DAILY  with a meal 60 tablet 0  . QUEtiapine (SEROQUEL) 100 MG tablet TAKE TWO TABLETS BY MOUTH AT BEDTIME 60 tablet 2  . amoxicillin-clavulanate (AUGMENTIN) 875-125 MG tablet Take 1 tablet by mouth 2 (two) times daily. 20 tablet 0  . predniSONE (STERAPRED UNI-PAK 21 TAB) 10 MG (21) TBPK tablet As directed x 6 days (Patient not taking: Reported on 07/20/2020) 21 tablet 0   No facility-administered medications prior to visit.    Allergies  Allergen Reactions  . Lexiscan [Regadenoson] Shortness Of Breath    Severe transient respiratory distress    ROS Review of Systems  Endocrine: Negative for cold intolerance, polydipsia and polyphagia.  Psychiatric/Behavioral: Positive for sleep disturbance. Negative for self-injury and suicidal ideas. The patient is nervous/anxious.   All other systems reviewed and are negative.     Objective:    Physical Exam Vitals reviewed.  Constitutional:      Appearance: Normal appearance. She is obese.  HENT:     Head: Normocephalic.  Eyes:     Conjunctiva/sclera: Conjunctivae normal.  Cardiovascular:     Rate and Rhythm: Normal rate.     Heart sounds: Normal heart sounds.     Comments: Tachycardia Pulmonary:     Effort: Pulmonary effort is normal.     Breath sounds: Normal breath sounds.  Abdominal:     General: Bowel sounds are normal.    Musculoskeletal:        General: Normal range of motion.  Skin:    General: Skin is warm.  Neurological:     Mental Status: She is alert and oriented to person, place, and time.  Psychiatric:        Behavior: Behavior normal.     BP 120/80   Pulse (!) 106   Temp 98.1 F (36.7 C) (Temporal)   Ht 5\' 4"  (1.626 m)   Wt 248 lb 3.2 oz (112.6 kg)   LMP 10/15/2015   BMI 42.60 kg/m  Wt Readings from Last 3 Encounters:  07/20/20 248 lb 3.2 oz (112.6 kg)  09/12/19 249 lb (112.9 kg)  06/12/19 230 lb (104.3 kg)     Health Maintenance Due  Topic Date Due  . OPHTHALMOLOGY EXAM  Never done  . COVID-19 Vaccine (1) Never done  . PAP SMEAR-Modifier  Never done  . Fecal DNA (Cologuard)  Never done  . MAMMOGRAM  08/04/2019  . FOOT EXAM  11/29/2019  . HEMOGLOBIN A1C  03/12/2020  . INFLUENZA VACCINE  05/10/2020      Lab Results  Component Value Date   TSH 4.430 07/19/2017   Lab Results  Component Value Date   WBC 7.9 05/08/2019   HGB 15.0 05/08/2019   HCT 43.4 05/08/2019   MCV 85 05/08/2019   PLT 219 05/08/2019   Lab Results  Component Value Date   NA 145 (H) 05/08/2019   K 3.9 05/08/2019   CO2 23 05/08/2019   GLUCOSE 140 (H) 05/08/2019   BUN 7 05/08/2019   CREATININE 0.68 05/08/2019   BILITOT 0.2 05/08/2019   ALKPHOS 83 05/08/2019   AST 45 (H) 05/08/2019   ALT 60 (H) 05/08/2019   PROT 6.5 05/08/2019   ALBUMIN 4.0 05/08/2019   CALCIUM 9.4 05/08/2019   Lab Results  Component Value Date   CHOL 253 (H) 05/08/2019   Lab Results  Component Value Date   HDL 29 (L) 05/08/2019   Lab Results  Component Value Date   LDLCALC Comment 05/08/2019   Lab Results  Component Value Date  TRIG 473 (H) 05/08/2019   Lab Results  Component Value Date   CHOLHDL 8.7 (H) 05/08/2019   Lab Results  Component Value Date   HGBA1C 6.4 07/20/2020      Assessment & Plan:   Problem List Items Addressed This Visit      Cardiovascular and Mediastinum   Hypertension  associated with diabetes ([place])    He is presents for follow-up hypertension, current blood pressure is well managed. Compliance with medication is good. No changes to medication dose today patient follows up as directed, provided education to patient with printed handouts given. Current medication-lisinopril 2.5 mg tablet by mouth daily. Refill sent to pharmacy      Relevant Medications   dapagliflozin propanediol (FARXIGA) 10 MG TABS tablet   glimepiride (AMARYL) 1 MG tablet   lisinopril (ZESTRIL) 2.5 MG tablet   metFORMIN (GLUCOPHAGE) 1000 MG tablet   atorvastatin (LIPITOR) 20 MG tablet     Respiratory   COPD (chronic obstructive pulmonary disease) ([facility])   Relevant Medications   albuterol (VENTOLIN HFA) 108 (90 Base) MCG/ACT inhaler   fluticasone (FLOVENT HFA) 110 MCG/ACT inhaler   albuterol (PROVENTIL) (2.5 MG/3ML) 0.083% nebulizer solution   cetirizine (ZYRTEC) 10 MG tablet   ipratropium-albuterol (DUONEB) 0.5-2.5 (3) MG/3ML SOLN     Endocrine   Hyperlipidemia associated with type 2 diabetes mellitus ([place])    Patient following up for hyperlipidemia. Patient is not reporting any hypolipidemia signs or symptoms. Provided education to patient, encourage low-cholesterol diet and exercise. Medication refill sent to pharmacy.      Relevant Medications   dapagliflozin propanediol (FARXIGA) 10 MG TABS tablet   glimepiride (AMARYL) 1 MG tablet   lisinopril (ZESTRIL) 2.5 MG tablet   metFORMIN (GLUCOPHAGE) 1000 MG tablet   atorvastatin (LIPITOR) 20 MG tablet   Diabetes mellitus without complication ([facility]) - Primary   Relevant Medications   dapagliflozin propanediol (FARXIGA) 10 MG TABS tablet   glimepiride (AMARYL) 1 MG tablet   lisinopril (ZESTRIL) 2.5 MG tablet   metFORMIN (GLUCOPHAGE) 1000 MG tablet   atorvastatin (LIPITOR) 20 MG tablet   Other Relevant Orders   Bayer DCA Hb A1c Waived (Completed)   Microalbumin / creatinine urine ratio   Microalbumin / creatinine urine ratio      Other   Depression    Patient is following up for depression, first diagnosed in 2018. Patient reports that signs and symptoms of depression has remained unchanged since first diagnosed. Patient reports working with a therapist because no medication regimen has worked. Patient denies any suicidal ideation today. Patient follows up with psychiatry outpatient. Patient is willing to follow-up with PCP to complete a [employer] study. Education provided to patient with printed handouts given. Follow-up with worsening or unresolved symptoms.  Rx refill sent to pharmacy.      Relevant Medications   QUEtiapine (SEROQUEL) 100 MG tablet   Lump of skin of back    Patient presents with lump on back of left upper shoulder. Patient denies any pain, or fever. Patient reports a similar lump in her right upper arm. Patient reports lump has been present in the last few weeks and has remained unchanged. Patient has done nothing over-the-counter to to remove lump. Ultrasound ordered. Pending results. Provided education to patient with printed handouts given.       Other Visit Diagnoses    COPD exacerbation ([place])       Relevant Medications   albuterol (VENTOLIN HFA) 108 (90 Base) MCG/ACT inhaler   fluticasone (FLOVENT HFA)  110 MCG/ACT inhaler   albuterol (PROVENTIL) (2.5 MG/3ML) 0.083% nebulizer solution   cetirizine (ZYRTEC) 10 MG tablet   ipratropium-albuterol (DUONEB) 0.5-2.5 (3) MG/3ML SOLN   Hyperlipidemia, unspecified hyperlipidemia type       Relevant Medications   lisinopril (ZESTRIL) 2.5 MG tablet   atorvastatin (LIPITOR) 20 MG tablet   Need for immunization against influenza       Relevant Orders   Flu Vaccine QUAD 36+ mos IM (Completed)      Meds ordered this encounter  Medications  . albuterol (VENTOLIN HFA) 108 (90 Base) MCG/ACT inhaler    Sig: Inhale 1-2 puffs into the lungs every 6 (six) hours as needed for wheezing or shortness of breath.    Dispense:  1 each    Refill:  3  .  fluticasone (FLOVENT HFA) 110 MCG/ACT inhaler    Sig: Inhale 2 puffs into the lungs 2 (two) times daily.    Dispense:  2 each    Refill:  3  . QUEtiapine (SEROQUEL) 100 MG tablet    Sig: Take 2 tablets (200 mg total) by mouth at bedtime.    Dispense:  60 tablet    Refill:  2    This prescription was filled on 05/06/2020. Any refills authorized will be placed on file.  Marland Kitchen albuterol (PROVENTIL) (2.5 MG/3ML) 0.083% nebulizer solution    Sig: INHALE THE CONTENTS OF ONE VIAL (3ML) VIA NEBULIZER EVERY 6 HOURS AS NEEDED    Dispense:  90 mL    Refill:  2    This prescription was filled on 08/28/2019. Any refills authorized will be placed on file.  . dapagliflozin propanediol (FARXIGA) 10 MG TABS tablet    Sig: TAKE 1 TABLET BY MOUTH EVERY DAY - NEEDS APPOINTMENT BEFORE NEXT FILL    Dispense:  15 tablet    Refill:  0  . glimepiride (AMARYL) 1 MG tablet    Sig: TAKE 1 TABLET BY MOUTH EVERY DAY with breakfast Must have appointment for further refills    Dispense:  15 tablet    Refill:  0  . lisinopril (ZESTRIL) 2.5 MG tablet    Sig: Take 1 tablet (2.5 mg total) by mouth daily.    Dispense:  90 tablet    Refill:  0  . metFORMIN (GLUCOPHAGE) 1000 MG tablet    Sig: TAKE 1 TABLET BY MOUTH TWICE DAILY with a meal    Dispense:  60 tablet    Refill:  0  . atorvastatin (LIPITOR) 20 MG tablet    Sig: Take 1 tablet (20 mg total) by mouth daily. Must have follow for further refills    Dispense:  15 tablet    Refill:  0    This prescription was filled on 03/05/2020. Any refills authorized will be placed on file.  . cetirizine (ZYRTEC) 10 MG tablet    Sig: Take 1 tablet (10 mg total) by mouth daily.    Dispense:  90 tablet    Refill:  0  . ipratropium-albuterol (DUONEB) 0.5-2.5 (3) MG/3ML SOLN    Sig: Take 3 mLs by nebulization every 6 (six) hours as needed.    Dispense:  360 mL    Refill:  2  . gabapentin (NEURONTIN) 300 MG capsule    Sig: Take 1 capsule (300 mg total) by mouth 3 (three) times  daily. Must have appoint for further refills    Dispense:  45 capsule    Refill:  0    Follow-up: Return in about 6  months (around 01/18/2021).    Ivy Lavonne, NP

## 2020-07-20 NOTE — Patient Instructions (Addendum)
Diabetes Basics  Diabetes (diabetes mellitus) is a long-term (chronic) disease. It occurs when the body does not properly use sugar (glucose) that is released from food after you eat. Diabetes may be caused by one or both of these problems:  Your pancreas does not make enough of a hormone called insulin.  Your body does not react in a normal way to insulin that it makes. Insulin lets sugars (glucose) go into cells in your body. This gives you energy. If you have diabetes, sugars cannot get into cells. This causes high blood sugar (hyperglycemia). Follow these instructions at home: How is diabetes treated? You may need to take insulin or other diabetes medicines daily to keep your blood sugar in balance. Take your diabetes medicines every day as told by your doctor. List your diabetes medicines here: Diabetes medicines  Name of medicine: ______________________________ ? Amount (dose): _______________ Time (a.m./p.m.): _______________ Notes: ___________________________________  Name of medicine: ______________________________ ? Amount (dose): _______________ Time (a.m./p.m.): _______________ Notes: ___________________________________  Name of medicine: ______________________________ ? Amount (dose): _______________ Time (a.m./p.m.): _______________ Notes: ___________________________________ If you use insulin, you will learn how to give yourself insulin by injection. You may need to adjust the amount based on the food that you eat. List the types of insulin you use here: Insulin  Insulin type: ______________________________ ? Amount (dose): _______________ Time (a.m./p.m.): _______________ Notes: ___________________________________ [place]ly [place] Major Depressive Disorder, Adult Major depressive disorder (MDD) is a mental health condition. MDD often makes you feel sad, hopeless, or helpless. MDD can also cause symptoms in your body. MDD can affect your: Work. School. Relationships. Other  normal activities. MDD can range from mild to very bad. It may occur once (single episode MDD). It can also occur many times (recurrent MDD). The main symptoms of MDD often include: Feeling sad, depressed, or irritable most of the time. Loss of interest. MDD symptoms also include: Sleeping too much or too little. Eating too much or too little. A change in your weight. Feeling tired (fatigue) or having low energy. Feeling worthless. Feeling guilty. Trouble making decisions. Trouble thinking clearly. Thoughts of suicide or harming others. Feeling weak. Feeling agitated. Keeping yourself from being around other people (isolation). Follow these instructions at home: Activity Do these things as told by your doctor: Go back to your normal activities. Exercise regularly. Spend time outdoors. Alcohol Talk with your doctor about how alcohol can affect your antidepressant medicines. Do not drink alcohol. Or, limit how much alcohol you drink. This means no more than 1 drink a day for nonpregnant women and 2 drinks a day for men. One drink equals one of these: 12 oz of beer. 5 oz of wine. 1 oz of hard liquor. General instructions Take over-the-counter and prescription medicines only as told by your doctor. Eat a healthy diet. Get plenty of sleep. Find activities that you enjoy. Make time to do them. Think about joining a support group. Your doctor may be able to suggest a group for you. Keep all follow-up visits as told by your doctor. This is important. Where to find more information: [employer] on Mental Illness: www.nami.org [place] National Institute of Mental Health: https://carter.com/ National Suicide Prevention Lifeline: (740) 193-4528. This is free, 24-hour help. Contact a doctor if: Your symptoms get worse. You have new symptoms. Get help right away if: You self-harm. You see, hear, taste, smell, or feel things that are not present (hallucinate). If you ever feel  like you may hurt yourself or others, or have thoughts about taking your own  life, get help right away. You can go to your nearest emergency department or call: Your local emergency services (911 in the [place] A suicide crisis helpline, such as the [place]: (816) 088-7837. This is open 24 hours a day. This information is not intended to replace advice given to you by your health care provider. Make sure you discuss any questions you have with your health care provider. Document Revised: 09/08/2017 Document Reviewed: 06/12/2016 Elsevier Patient Education  2020 [employer].  n type: ______________________________ ? Amount (dose): _______________ Time (a.m./p.m.): _______________ Notes: ___________________________________  Insulin type: ______________________________ ? Amount (dose): _______________ Time (a.m./p.m.): _______________ Notes: ___________________________________  Insulin type: ______________________________ ? Amount (dose): _______________ Time (a.m./p.m.): _______________ Notes: ___________________________________  Insulin type: ______________________________ ? Amount (dose): _______________ Time (a.m./p.m.): _______________ Notes: ___________________________________ How do I manage my blood sugar?  Check your blood sugar levels using a blood glucose monitor as directed by your doctor. Your doctor will set treatment goals for you. Generally, you should have these blood sugar levels:  Before meals (preprandial): 80-130 mg/dL (4.4-7.2 mmol/L).  After meals (postprandial): below 180 mg/dL (10 mmol/L).  A1c level: less than 7%. Write down the times that you will check your blood sugar levels: Blood sugar checks  Time: _______________ Notes: ___________________________________  Time: _______________ Notes: ___________________________________  Time: _______________ Notes: ___________________________________  Time: _______________ Notes:  ___________________________________  Time: _______________ Notes: ___________________________________  Time: _______________ Notes: ___________________________________  What do I need to know about low blood sugar? Low blood sugar is called hypoglycemia. This is when blood sugar is at or below 70 mg/dL (3.9 mmol/L). Symptoms may include:  Feeling: ? Hungry. ? Worried or nervous (anxious). ? Sweaty and clammy. ? Confused. ? Dizzy. ? Sleepy. ? Sick to your stomach (nauseous).  Having: ? A fast heartbeat. ? A headache. ? A change in your vision. ? Tingling or no feeling (numbness) around the mouth, lips, or tongue. ? Jerky movements that you cannot control (seizure).  Having trouble with: ? Moving (coordination). ? Sleeping. ? Passing out (fainting). ? Getting upset easily (irritability). Treating low blood sugar To treat low blood sugar, eat or drink something sugary right away. If you can think clearly and swallow safely, follow the 15:15 rule:  Take 15 grams of a fast-acting carb (carbohydrate). Talk with your doctor about how much you should take.  Some fast-acting carbs are: ? Sugar tablets (glucose pills). Take 3-4 glucose pills. ? 6-8 pieces of hard candy. ? 4-6 oz (120-150 mL) of fruit juice. ? 4-6 oz (120-150 mL) of regular (not diet) soda. ? 1 Tbsp (15 mL) honey or sugar.  Check your blood sugar 15 minutes after you take the carb.  If your blood sugar is still at or below 70 mg/dL (3.9 mmol/L), take 15 grams of a carb again.  If your blood sugar does not go above 70 mg/dL (3.9 mmol/L) after 3 tries, get help right away.  After your blood sugar goes back to normal, eat a meal or a snack within 1 hour. Treating very low blood sugar If your blood sugar is at or below 54 mg/dL (3 mmol/L), you have very low blood sugar (severe hypoglycemia). This is an emergency. Do not wait to see if the symptoms will go away. Get medical help right away. Call your local emergency  services (911 in the [place] Do not drive yourself to the hospital. Questions to ask your health care provider  Do I need to meet with a diabetes educator?  What  equipment will I need to care for myself at home?  What diabetes medicines do I need? When should I take them?  How often do I need to check my blood sugar?  What number can I call if I have questions?  When is my next doctor's visit?  Where can I find a support group for people with diabetes? Where to find more information  American Diabetes Association: www.diabetes.org  American Association of Diabetes Educators: www.diabeteseducator.org/patient-resources Contact a doctor if:  Your blood sugar is at or above 240 mg/dL (13.3 mmol/L) for 2 days in a row.  You have been sick or have had a fever for 2 days or more, and you are not getting better.  You have any of these problems for more than 6 hours: ? You cannot eat or drink. ? You feel sick to your stomach (nauseous). ? You throw up (vomit). ? You have watery poop (diarrhea). Get help right away if:  Your blood sugar is lower than 54 mg/dL (3 mmol/L).  You get confused.  You have trouble: ? Thinking clearly. ? Breathing. Summary  Diabetes (diabetes mellitus) is a long-term (chronic) disease. It occurs when the body does not properly use sugar (glucose) that is released from food after digestion.  Take insulin and diabetes medicines as told.  Check your blood sugar every day, as often as told.  Keep all follow-up visits as told by your doctor. This is important. This information is not intended to replace advice given to you by your health care provider. Make sure you discuss any questions you have with your health care provider. Document Revised: 06/19/2019 Document Reviewed: 12/29/2017 Elsevier Patient Education  [place].

## 2020-07-20 NOTE — Assessment & Plan Note (Signed)
Patient is following up for depression, first diagnosed in 2018. Patient reports that signs and symptoms of depression has remained unchanged since first diagnosed. Patient reports working with a therapist because no medication regimen has worked. Patient denies any suicidal ideation today. Patient follows up with psychiatry outpatient. Patient is willing to follow-up with PCP to complete a [employer] study. Education provided to patient with printed handouts given. Follow-up with worsening or unresolved symptoms.  Rx refill sent to pharmacy.

## 2020-07-20 NOTE — Assessment & Plan Note (Signed)
He is presents for follow-up hypertension, current blood pressure is well managed. Compliance with medication is good. No changes to medication dose today patient follows up as directed, provided education to patient with printed handouts given. Current medication-lisinopril 2.5 mg tablet by mouth daily. Refill sent to pharmacy

## 2020-07-20 NOTE — Assessment & Plan Note (Signed)
Patient following up for hyperlipidemia. Patient is not reporting any hypolipidemia signs or symptoms. Provided education to patient, encourage low-cholesterol diet and exercise. Medication refill sent to pharmacy.

## 2020-07-20 NOTE — Assessment & Plan Note (Signed)
Patient presents with lump on back of left upper shoulder. Patient denies any pain, or fever. Patient reports a similar lump in her right upper arm. Patient reports lump has been present in the last few weeks and has remained unchanged. Patient has done nothing over-the-counter to to remove lump. Ultrasound ordered. Pending results. Provided education to patient with printed handouts given.

## 2020-07-21 ENCOUNTER — Ambulatory Visit: Payer: Medicaid Other | Admitting: Family Medicine

## 2020-07-21 LAB — MICROALBUMIN / CREATININE URINE RATIO
Creatinine, Urine: 119.4 mg/dL
Microalb/Creat Ratio: 9 mg/g creat (ref 0–29)
Microalbumin, Urine: 11.2 ug/mL

## 2020-07-22 ENCOUNTER — Other Ambulatory Visit: Payer: Self-pay | Admitting: Family Medicine

## 2020-07-22 DIAGNOSIS — Z1231 Encounter for screening mammogram for malignant neoplasm of breast: Secondary | ICD-10-CM

## 2020-07-24 ENCOUNTER — Other Ambulatory Visit (HOSPITAL_COMMUNITY)
Admission: RE | Admit: 2020-07-24 | Discharge: 2020-07-24 | Disposition: A | Discharge status: Home / Self Care | Payer: Medicaid Other | Source: Ambulatory Visit | Attending: Nurse Practitioner | Admitting: Nurse Practitioner

## 2020-07-24 ENCOUNTER — Encounter: Payer: Self-pay | Admitting: Nurse Practitioner

## 2020-07-24 ENCOUNTER — Other Ambulatory Visit: Payer: Self-pay

## 2020-07-24 ENCOUNTER — Ambulatory Visit (INDEPENDENT_AMBULATORY_CARE_PROVIDER_SITE_OTHER): Payer: Medicaid Other | Admitting: Nurse Practitioner

## 2020-07-24 ENCOUNTER — Telehealth: Payer: Self-pay

## 2020-07-24 VITALS — BP 119/66 | HR 91 | Temp 98.4°F | Resp 20 | Ht 64.0 in | Wt 250.0 lb

## 2020-07-24 DIAGNOSIS — I10 Essential (primary) hypertension: Secondary | ICD-10-CM | POA: Diagnosis not present

## 2020-07-24 DIAGNOSIS — Z0000 Encounter for general adult medical examination without abnormal findings: Secondary | ICD-10-CM | POA: Insufficient documentation

## 2020-07-24 DIAGNOSIS — E1159 Type 2 diabetes mellitus with other circulatory complications: Secondary | ICD-10-CM

## 2020-07-24 DIAGNOSIS — I152 Hypertension secondary to endocrine disorders: Secondary | ICD-10-CM

## 2020-07-24 DIAGNOSIS — Z0001 Encounter for general adult medical examination with abnormal findings: Secondary | ICD-10-CM | POA: Diagnosis not present

## 2020-07-24 DIAGNOSIS — B078 Other viral warts: Secondary | ICD-10-CM | POA: Diagnosis not present

## 2020-07-24 DIAGNOSIS — E119 Type 2 diabetes mellitus without complications: Secondary | ICD-10-CM

## 2020-07-24 LAB — CBC WITH DIFFERENTIAL/PLATELET

## 2020-07-24 LAB — BAYER DCA HB A1C WAIVED: HB A1C (BAYER DCA - WAIVED): 6.7 % (ref <7.0)

## 2020-07-24 MED ORDER — SALICYLIC ACID 17 % EX SOLN: Freq: Every day | CUTANEOUS | 0 refills | Status: AC

## 2020-07-24 NOTE — Patient Instructions (Signed)
Health Maintenance, Female Adopting a healthy lifestyle and getting preventive care are important in promoting health and wellness. Ask your health care provider about:  The right schedule for you to have regular tests and exams.  Things you can do on your own to prevent diseases and keep yourself healthy. What should I know about diet, weight, and exercise? Eat a healthy diet   Eat a diet that includes plenty of vegetables, fruits, low-fat dairy products, and lean protein.  Do not eat a lot of foods that are high in solid fats, added sugars, or sodium. Maintain a healthy weight Body mass index (BMI) is used to identify weight problems. It estimates body fat based on height and weight. Your health care provider can help determine your BMI and help you achieve or maintain a healthy weight. Get regular exercise Get regular exercise. This is one of the most important things you can do for your health. Most adults should:  Exercise for at least 150 minutes each week. The exercise should increase your heart rate and make you sweat (moderate-intensity exercise).  Do strengthening exercises at least twice a week. This is in addition to the moderate-intensity exercise.  Spend less time sitting. Even light physical activity can be beneficial. Watch cholesterol and blood lipids Have your blood tested for lipids and cholesterol at 57 years of age, then have this test every 5 years. Have your cholesterol levels checked more often if:  Your lipid or cholesterol levels are high.  You are older than 57 years of age.  You are at high risk for heart disease. What should I know about cancer screening? Depending on your health history and family history, you may need to have cancer screening at various ages. This may include screening for:  Breast cancer.  Cervical cancer.  Colorectal cancer.  Skin cancer.  Lung cancer. What should I know about heart disease, diabetes, and high blood  pressure? Blood pressure and heart disease  High blood pressure causes heart disease and increases the risk of stroke. This is more likely to develop in people who have high blood pressure readings, are of African descent, or are overweight.  Have your blood pressure checked: ? Every 3-5 years if you are 18-39 years of age. ? Every year if you are 40 years old or older. Diabetes Have regular diabetes screenings. This checks your fasting blood sugar level. Have the screening done:  Once every three years after age 40 if you are at a normal weight and have a low risk for diabetes.  More often and at a younger age if you are overweight or have a high risk for diabetes. What should I know about preventing infection? Hepatitis B If you have a higher risk for hepatitis B, you should be screened for this virus. Talk with your health care provider to find out if you are at risk for hepatitis B infection. Hepatitis C Testing is recommended for:  Everyone born from 1945 through 1965.  Anyone with known risk factors for hepatitis C. Sexually transmitted infections (STIs)  Get screened for STIs, including gonorrhea and chlamydia, if: ? You are sexually active and are younger than 57 years of age. ? You are older than 57 years of age and your health care provider tells you that you are at risk for this type of infection. ? Your sexual activity has changed since you were last screened, and you are at increased risk for chlamydia or gonorrhea. Ask your health care provider if   you are at risk.  Ask your health care provider about whether you are at high risk for HIV. Your health care provider may recommend a prescription medicine to help prevent HIV infection. If you choose to take medicine to prevent HIV, you should first get tested for HIV. You should then be tested every 3 months for as long as you are taking the medicine. Pregnancy  If you are about to stop having your period (premenopausal) and  you may become pregnant, seek counseling before you get pregnant.  Take 400 to 800 micrograms (mcg) of folic acid every day if you become pregnant.  Ask for birth control (contraception) if you want to prevent pregnancy. Osteoporosis and menopause Osteoporosis is a disease in which the bones lose minerals and strength with aging. This can result in bone fractures. If you are 65 years old or older, or if you are at risk for osteoporosis and fractures, ask your health care provider if you should:  Be screened for bone loss.  Take a calcium or vitamin D supplement to lower your risk of fractures.  Be given hormone replacement therapy (HRT) to treat symptoms of menopause. Follow these instructions at home: Lifestyle  Do not use any products that contain nicotine or tobacco, such as cigarettes, e-cigarettes, and chewing tobacco. If you need help quitting, ask your health care provider.  Do not use street drugs.  Do not share needles.  Ask your health care provider for help if you need support or information about quitting drugs. Alcohol use  Do not drink alcohol if: ? Your health care provider tells you not to drink. ? You are pregnant, may be pregnant, or are planning to become pregnant.  If you drink alcohol: ? Limit how much you use to 0-1 drink a day. ? Limit intake if you are breastfeeding.  Be aware of how much alcohol is in your drink. In the [place] one drink equals one 12 oz bottle of beer (355 mL), one 5 oz glass of wine (148 mL), or one 1 oz glass of hard liquor (44 mL). General instructions  Schedule regular health, dental, and eye exams.  Stay current with your vaccines.  Tell your health care provider if: ? You often feel depressed. ? You have ever been abused or do not feel safe at home. Summary  Adopting a healthy lifestyle and getting preventive care are important in promoting health and wellness.  Follow your health care provider's instructions about healthy  diet, exercising, and getting tested or screened for diseases.  Follow your health care provider's instructions on monitoring your cholesterol and blood pressure. This information is not intended to replace advice given to you by your health care provider. Make sure you discuss any questions you have with your health care provider. Document Revised: 09/19/2018 Document Reviewed: 09/19/2018 Elsevier Patient Education  2020 [employer]  

## 2020-07-24 NOTE — Telephone Encounter (Signed)
Wants a referral to have her tendonitis in Left hand/middle finger looked at.

## 2020-07-24 NOTE — Assessment & Plan Note (Signed)
On assessment viral wart found in the outside part of the vagina close to outer labia.  This is not new but recurrent for patient.  Provided education to patient.  Salicylic acid external solution sent to pharmacy.  Follow-up with worsening or unresolved symptoms.

## 2020-07-24 NOTE — Assessment & Plan Note (Signed)
Patient is a 59 year old female who presents to clinic for diabetic follow-up.  Patient is currently well managed on Metformin 1000 mg tablet twice daily, Amaryl, and Farxiga  Continue to provide education to patient on health maintenance.  Diet modification and exercise as tolerated. Follow-up with worsening or unresolved symptoms.

## 2020-07-24 NOTE — Assessment & Plan Note (Addendum)
Patient is a 59 year old female who presents to clinic for annual physical exam.  Completed head to toe assessment.  Provided education to health maintenance and preventative care.  Printed handouts given. Patient is not interested in smoking cessation.  Completed labs-CBC, CMP, lipid panel, TSH, A1c and Pap.Patient mammogram scheduled for November.Completed diabetic foot exam. Follow-up in 6 months.

## 2020-07-24 NOTE — Progress Notes (Signed)
Established Patient Office Visit  Subjective:  Patient ID: Kimberly Green, female    DOB: August 21, 1961  Age: 59 y.o. MRN: [id number]  CC:  Chief Complaint  Patient presents with  . Annual Exam    HPI Kimberly Green presents for .   Encounter for general adult medical examination without abnormal findings  Physical: Patient's last physical exam was 1 year ago .  Weight: not appropriate for height (BMI greater than 27%) ;  Blood Pressure: Normal (BP less than 120/80) ;  Medical History: Patient history reviewed ; Family history reviewed ;  Allergies Reviewed: No change in current allergies ;  Medications Reviewed: Medications reviewed - no changes ;  Lipids: Normal lipid levels ;  Smoking: Life-long-smoker ;  Physical Activity: Does not exercises at least 3 times per week ;  Alcohol/Drug Use: Is a non-drinker ; No illicit drug use ;  Patient is not afflicted from Stress Incontinence and Urge Incontinence  Safety: reviewed ; Patient wears a seat belt, has smoke detectors, has carbon monoxide detectors, Dental Care: biannual cleanings, brushes and flosses daily. Ophthalmology/Optometry: Annual visit.  Hearing loss: none Vision impairments: none  Past Medical History:  Diagnosis Date  . Adrenal tumor, benign    S/P L adrenal resection, 10/11 ([place])  . Bipolar disorder ([place])   . Cervical cancer ([place]) 1987  . Cholelithiasis   . COPD (chronic obstructive pulmonary disease) ([place])   . Diabetes mellitus without complication ([place])   . Essential hypertension, benign   . IBS (irritable bowel syndrome)   . Mixed hyperlipidemia   . PTSD (post-traumatic stress disorder)   . Renal carcinoma ([place])    S/P partial R nephrectomy (1/3 removed), 8/11 ([place])  . Sleep apnea     Past Surgical History:  Procedure Laterality Date  . ADRENAL GLAND SURGERY    . BACK SURGERY    . CERVICAL CONE BIOPSY     cervical cancer  . CHOLECYSTECTOMY    . KIDNEY MASS REMOVAL    . NECK  SURGERY    . RIGHT WRIST SURGERY    . SHOULDER SURGERY Left   . TUBAL LIGATION      Family History  Problem Relation Age of Onset  . Coronary artery disease Mother 55  . COPD Mother   . Hypertension Mother   . Breast cancer Mother   . Uterine cancer Mother   . Heart failure Father 38  . Heart disease Father   . Colon cancer Other        Aunt  . Lung cancer Other        Aunt  . Diabetes Sister   . Heart disease Sister   . Heart failure Maternal Grandmother   . Heart failure Paternal Grandmother   . Cerebral palsy Brother   . Early death Brother        MVA  . Diabetes Sister   . Heart disease Sister   . Valvular heart disease Sister   . Early death Sister        Murdered  . Uterine cancer Daughter       Outpatient Medications Prior to Visit  Medication Sig Dispense Refill  . albuterol (PROVENTIL) (2.5 MG/3ML) 0.083% nebulizer solution INHALE THE CONTENTS OF ONE VIAL (3ML) VIA NEBULIZER EVERY 6 HOURS AS NEEDED 90 mL 2  . albuterol (VENTOLIN HFA) 108 (90 Base) MCG/ACT inhaler Inhale 1-2 puffs into the lungs every 6 (six) hours as needed for wheezing or shortness of breath. 1  each 3  . amphetamine-dextroamphetamine (ADDERALL) 10 MG tablet TAKE 1 TABLET BY MOUTH EVERY MORNING AND TAKE 1 TABLET BY MOUTH EVERY DAY AT NOON - MAY FILL IN JULY  0  . atorvastatin (LIPITOR) 20 MG tablet Take 1 tablet (20 mg total) by mouth daily. Must have follow for further refills 15 tablet 0  . cetirizine (ZYRTEC) 10 MG tablet Take 1 tablet (10 mg total) by mouth daily. 90 tablet 0  . dapagliflozin propanediol (FARXIGA) 10 MG TABS tablet TAKE 1 TABLET BY MOUTH EVERY DAY - NEEDS APPOINTMENT BEFORE NEXT FILL 15 tablet 0  . fluticasone (FLOVENT HFA) 110 MCG/ACT inhaler Inhale 2 puffs into the lungs 2 (two) times daily. 2 each 3  . gabapentin (NEURONTIN) 300 MG capsule Take 1 capsule (300 mg total) by mouth 3 (three) times daily. Must have appoint for further refills 45 capsule 0  . glimepiride  (AMARYL) 1 MG tablet TAKE 1 TABLET BY MOUTH EVERY DAY with breakfast Must have appointment for further refills 15 tablet 0  . glucose blood (ACCU-CHEK AVIVA PLUS) test strip Use to check BG twice daily 100 each 2  . ipratropium-albuterol (DUONEB) 0.5-2.5 (3) MG/3ML SOLN Take 3 mLs by nebulization every 6 (six) hours as needed. 360 mL 2  . Lancets (ACCU-CHEK SOFT TOUCH) lancets Use to check BG twice a day 100 each 2  . lisinopril (ZESTRIL) 2.5 MG tablet Take 1 tablet (2.5 mg total) by mouth daily. 90 tablet 0  . metFORMIN (GLUCOPHAGE) 1000 MG tablet TAKE 1 TABLET BY MOUTH TWICE DAILY with a meal 60 tablet 0  . QUEtiapine (SEROQUEL) 100 MG tablet Take 2 tablets (200 mg total) by mouth at bedtime. 60 tablet 2   No facility-administered medications prior to visit.    Allergies  Allergen Reactions  . Lexiscan [Regadenoson] Shortness Of Breath    Severe transient respiratory distress    ROS Review of Systems  Constitutional: Negative.   HENT: Negative.   Eyes: Negative.   Respiratory: Positive for cough. Negative for shortness of breath.   Cardiovascular: Negative.   Gastrointestinal: Negative.   Genitourinary: Negative.   Musculoskeletal: Positive for back pain.  Skin:       Wart  Neurological: Negative.   Psychiatric/Behavioral: Negative for sleep disturbance and suicidal ideas. The patient is not nervous/anxious.       Objective:    Physical Exam Vitals reviewed.  Constitutional:      Appearance: Normal appearance. She is obese.  HENT:     Head: Normocephalic.     Mouth/Throat:     Pharynx: Oropharynx is clear.  Eyes:     Conjunctiva/sclera: Conjunctivae normal.  Cardiovascular:     Rate and Rhythm: Normal rate and regular rhythm.     Pulses: Normal pulses.     Heart sounds: Normal heart sounds.  Pulmonary:     Effort: Pulmonary effort is normal.     Breath sounds: Normal breath sounds.  Abdominal:     General: Bowel sounds are normal.     Tenderness: There is  abdominal tenderness.  Musculoskeletal:        General: Tenderness present.     Comments: Back pain  Skin:    Comments: Wart  Neurological:     Mental Status: She is alert and oriented to person, place, and time.  Psychiatric:        Mood and Affect: Mood normal.        Behavior: Behavior normal.     BP 119/66  Pulse 91   Temp 98.4 F (36.9 C) (Temporal)   Resp 20   Ht 5\' 4"  (1.626 m)   Wt 250 lb (113.4 kg)   LMP 10/15/2015   SpO2 96%   BMI 42.91 kg/m  Wt Readings from Last 3 Encounters:  07/24/20 250 lb (113.4 kg)  07/20/20 248 lb 3.2 oz (112.6 kg)  09/12/19 249 lb (112.9 kg)     Health Maintenance Due  Topic Date Due  . OPHTHALMOLOGY EXAM  Never done  . PAP SMEAR-Modifier  Never done  . Fecal DNA (Cologuard)  Never done  . MAMMOGRAM  08/04/2019  . FOOT EXAM  11/29/2019    There are no preventive care reminders to display for this patient.  Lab Results  Component Value Date   TSH 4.430 07/19/2017   Lab Results  Component Value Date   WBC 7.9 05/08/2019   HGB 15.0 05/08/2019   HCT 43.4 05/08/2019   MCV 85 05/08/2019   PLT 219 05/08/2019   Lab Results  Component Value Date   NA 145 (H) 05/08/2019   K 3.9 05/08/2019   CO2 23 05/08/2019   GLUCOSE 140 (H) 05/08/2019   BUN 7 05/08/2019   CREATININE 0.68 05/08/2019   BILITOT 0.2 05/08/2019   ALKPHOS 83 05/08/2019   AST 45 (H) 05/08/2019   ALT 60 (H) 05/08/2019   PROT 6.5 05/08/2019   ALBUMIN 4.0 05/08/2019   CALCIUM 9.4 05/08/2019   Lab Results  Component Value Date   CHOL 253 (H) 05/08/2019   Lab Results  Component Value Date   HDL 29 (L) 05/08/2019   Lab Results  Component Value Date   LDLCALC Comment 05/08/2019   Lab Results  Component Value Date   TRIG 473 (H) 05/08/2019   Lab Results  Component Value Date   CHOLHDL 8.7 (H) 05/08/2019   Lab Results  Component Value Date   HGBA1C 6.4 07/20/2020      Assessment & Plan:   Problem List Items Addressed This Visit       Cardiovascular and Mediastinum   Hypertension associated with diabetes ([place])     Endocrine   Diabetes mellitus without complication [facility])    Patient is a 59 year old female who presents to clinic for diabetic follow-up.  Patient is currently well managed on Metformin 1000 mg tablet twice daily, Amaryl, and Farxiga  Continue to provide education to patient on health maintenance.  Diet modification and exercise as tolerated. Follow-up with worsening or unresolved symptoms.        Other   Annual physical exam - Primary    Patient is a 59 year old female who presents to clinic for annual physical exam.  Completed head to toe assessment.  Provided education to health maintenance and preventative care.  Printed handouts given. Patient is not interested in smoking cessation.  Completed labs-CBC, CMP, lipid panel, TSH, A1c and Pap.Patient mammogram scheduled for November.Completed diabetic foot exam. Follow-up in 6 months.       Relevant Orders   Cytology - PAP([place])   CBC with Differential   Comprehensive metabolic panel   Lipid Panel   Bayer DCA Hb A1c Waived   Other viral warts    On assessment viral wart found in the outside part of the vagina close to outer labia.  This is not new but recurrent for patient.  Provided education to patient.  Salicylic acid external solution sent to pharmacy.  Follow-up with worsening or unresolved symptoms.      Relevant  Medications   salicylic acid-lactic acid 17 % external solution    Other Visit Diagnoses    Essential hypertension          Meds ordered this encounter  Medications  . salicylic acid-lactic acid 17 % external solution    Sig: Apply topically daily.    Dispense:  14 mL    Refill:  0    Order Specific Question:   Supervising Provider    Answer:   Caryl Pina A [[id number]]    Follow-up: Return in about 1 year (around 07/24/2021).    Kimberly Lilymae, NP

## 2020-07-25 LAB — COMPREHENSIVE METABOLIC PANEL
ALT: 57 IU/L — ABNORMAL HIGH (ref 0–32)
AST: 53 IU/L — ABNORMAL HIGH (ref 0–40)
Albumin/Globulin Ratio: 1.9 (ref 1.2–2.2)
Albumin: 4.5 g/dL (ref 3.8–4.9)
Alkaline Phosphatase: 89 IU/L (ref 44–121)
BUN/Creatinine Ratio: 13 (ref 9–23)
BUN: 9 mg/dL (ref 6–24)
Bilirubin Total: 0.3 mg/dL (ref 0.0–1.2)
CO2: 23 mmol/L (ref 20–29)
Calcium: 10.1 mg/dL (ref 8.7–10.2)
Chloride: 105 mmol/L (ref 96–106)
Creatinine, Ser: 0.69 mg/dL (ref 0.57–1.00)
GFR calc Af Amer: 112 mL/min/{1.73_m2} (ref >59)
GFR calc non Af Amer: 97 mL/min/{1.73_m2} (ref >59)
Globulin, Total: 2.4 g/dL (ref 1.5–4.5)
Glucose: 87 mg/dL (ref 65–99)
Potassium: 3.9 mmol/L (ref 3.5–5.2)
Sodium: 142 mmol/L (ref 134–144)
Total Protein: 6.9 g/dL (ref 6.0–8.5)

## 2020-07-25 LAB — LIPID PANEL
Chol/HDL Ratio: 3.3 ratio (ref 0.0–4.4)
Cholesterol, Total: 122 mg/dL (ref 100–199)
HDL: 37 mg/dL — ABNORMAL LOW (ref >39)
LDL Chol Calc (NIH): 52 mg/dL (ref 0–99)
Triglycerides: 204 mg/dL — ABNORMAL HIGH (ref 0–149)
VLDL Cholesterol Cal: 33 mg/dL (ref 5–40)

## 2020-07-25 LAB — CBC WITH DIFFERENTIAL/PLATELET
Basophils Absolute: 0.1 10*3/uL (ref 0.0–0.2)
Basos: 1 %
EOS (ABSOLUTE): 0.3 10*3/uL (ref 0.0–0.4)
Eos: 3 %
Hematocrit: 43.2 % (ref 34.0–46.6)
Hemoglobin: 14.7 g/dL (ref 11.1–15.9)
Immature Grans (Abs): 0 10*3/uL (ref 0.0–0.1)
Immature Granulocytes: 0 %
Lymphocytes Absolute: 2.8 10*3/uL (ref 0.7–3.1)
Lymphs: 33 %
MCH: 29.2 pg (ref 26.6–33.0)
MCHC: 34 g/dL (ref 31.5–35.7)
MCV: 86 fL (ref 79–97)
Monocytes Absolute: 0.6 10*3/uL (ref 0.1–0.9)
Monocytes: 7 %
Neutrophils Absolute: 4.6 10*3/uL (ref 1.4–7.0)
Neutrophils: 56 %
Platelets: 255 10*3/uL (ref 150–450)
RBC: 5.03 x10E6/uL (ref 3.77–5.28)
RDW: 12.6 % (ref 11.7–15.4)
WBC: 8.4 10*3/uL (ref 3.4–10.8)

## 2020-07-25 NOTE — Telephone Encounter (Signed)
Is this something y'all have discussed since you have seen her the past two times? If not, she needs an appointment.

## 2020-07-26 ENCOUNTER — Other Ambulatory Visit: Payer: Self-pay | Admitting: Nurse Practitioner

## 2020-07-26 DIAGNOSIS — R7401 Elevation of levels of liver transaminase levels: Secondary | ICD-10-CM

## 2020-07-26 NOTE — Telephone Encounter (Signed)
Please have patient make an appointment with her PCP to evaluate tendonitis. Thank you

## 2020-07-27 NOTE — Telephone Encounter (Signed)
Appointment scheduled.

## 2020-07-28 ENCOUNTER — Encounter: Payer: Self-pay | Admitting: Internal Medicine

## 2020-07-28 ENCOUNTER — Ambulatory Visit (HOSPITAL_COMMUNITY)
Admission: RE | Admit: 2020-07-28 | Discharge: 2020-07-28 | Disposition: A | Discharge status: Home / Self Care | Payer: Medicaid Other | Source: Ambulatory Visit | Attending: Nurse Practitioner | Admitting: Nurse Practitioner

## 2020-07-28 ENCOUNTER — Other Ambulatory Visit: Payer: Self-pay

## 2020-07-28 DIAGNOSIS — R2231 Localized swelling, mass and lump, right upper limb: Secondary | ICD-10-CM | POA: Diagnosis not present

## 2020-07-28 DIAGNOSIS — R222 Localized swelling, mass and lump, trunk: Secondary | ICD-10-CM | POA: Diagnosis present

## 2020-07-29 LAB — CYTOLOGY - PAP
Adequacy: ABSENT
Diagnosis: NEGATIVE
Diagnosis: REACTIVE

## 2020-07-30 ENCOUNTER — Ambulatory Visit (INDEPENDENT_AMBULATORY_CARE_PROVIDER_SITE_OTHER): Payer: Medicaid Other | Admitting: Family Medicine

## 2020-07-30 ENCOUNTER — Encounter: Payer: Self-pay | Admitting: Family Medicine

## 2020-07-30 DIAGNOSIS — M19049 Primary osteoarthritis, unspecified hand: Secondary | ICD-10-CM

## 2020-07-30 NOTE — Progress Notes (Signed)
Subjective:    Patient ID: Kimberly Green, female    DOB: 1960/10/23, 59 y.o.   MRN: [id number]   HPI: [name] is a 59 y.o. female presenting for tendinitis in both hands. Missed her referral. Feel tight. Swollen. Can't make a fist. Left hand locked up for 2 days. Then it released like a balloon popped. Now aching, can't grip anything. Onset was a year ago. Getting worse in spite of use of prn Advil. Locks up frequently at knuckles of PIP joints. Had accident in 1985 with extensive reconstruction of the right hand.Declines "pills." Wants to see a Psychologist, sport and exercise.    Depression screen [facility] 2/9 07/24/2020 07/20/2020 09/12/2019 05/08/2019 11/28/2018  Decreased Interest 3 3 3 3 3   Down, Depressed, Hopeless 3 3 3 3 3   PHQ - 2 Score 6 6 6 6 6   Altered sleeping 3 3 0 0 3  Tired, decreased energy 3 3 2 3 3   Change in appetite 3 3 1 3 3   Feeling bad or failure about yourself  3 3 0 0 3  Trouble concentrating 3 3 3 3 3   Moving slowly or fidgety/restless 0 0 0 0 3  Suicidal thoughts 1 3 3 3 3   PHQ-9 Score 22 24 15 18 27   Difficult doing work/chores Not difficult at all Very difficult Very difficult Very difficult -  Some recent data might be hidden     Relevant past medical, surgical, family and social history reviewed and updated as indicated.  Interim medical history since our last visit reviewed. Allergies and medications reviewed and updated.  ROS:  Review of Systems   Social History   Tobacco Use  Smoking Status Current Every Day Smoker  . Packs/day: 0.25  . Years: 45.00  . Pack years: 11.25  . Types: Cigarettes  Smokeless Tobacco Never Used       Objective:     Wt Readings from Last 3 Encounters:  07/24/20 250 lb (113.4 kg)  07/20/20 248 lb 3.2 oz (112.6 kg)  09/12/19 249 lb (112.9 kg)     Exam deferred. Pt. [place] due to COVID 19. Phone visit performed.   Assessment & Plan:   1. Arthritis of hand     No orders of the defined types were placed  in this encounter.   Orders Placed This Encounter  Procedures  . Ambulatory referral to Hand Surgery    Referral Priority:   Routine    Referral Type:   Surgical    Referral Reason:   Specialty Services Required    Requested Specialty:   Hand Surgery    Number of Visits Requested:   1      Diagnoses and all orders for this visit:  Arthritis of hand -     Ambulatory referral to Hand Surgery    Virtual Visit via telephone Note  I discussed the limitations, risks, security and privacy concerns of performing an evaluation and management service by telephone and the availability of in person appointments. The patient was identified with two identifiers. Pt.expressed understanding and agreed to proceed. Pt. Is at home. Dr. Livia Snellen is in his office.  Follow Up Instructions:   I discussed the assessment and treatment plan with the patient. The patient was provided an opportunity to ask questions and all were answered. The patient agreed with the plan and demonstrated an understanding of the instructions.   The patient was advised to call back or seek an in-person evaluation if the symptoms worsen or  if the condition fails to improve as anticipated.   Total minutes including chart review and phone contact time: 18   Follow up plan: Return if symptoms worsen or fail to improve.  Claretta Fraise, MD [place]

## 2020-08-18 ENCOUNTER — Encounter: Payer: Medicaid Other | Admitting: Nurse Practitioner

## 2020-08-24 ENCOUNTER — Other Ambulatory Visit: Payer: Self-pay | Admitting: *Deleted

## 2020-08-24 DIAGNOSIS — E119 Type 2 diabetes mellitus without complications: Secondary | ICD-10-CM

## 2020-08-24 MED ORDER — GLIMEPIRIDE 1 MG PO TABS: ORAL_TABLET | ORAL | 2 refills | Status: DC

## 2020-08-24 MED ORDER — DAPAGLIFLOZIN PROPANEDIOL 10 MG PO TABS: ORAL_TABLET | ORAL | 2 refills | Status: DC

## 2020-08-24 MED ORDER — GABAPENTIN 300 MG PO CAPS
300.0000 mg | ORAL_CAPSULE | Freq: Three times a day (TID) | ORAL | 2 refills | Status: DC
Start: 2020-08-24 — End: 2020-11-23

## 2020-08-24 MED ORDER — METFORMIN HCL 1000 MG PO TABS: ORAL_TABLET | ORAL | 2 refills | Status: DC

## 2020-09-01 ENCOUNTER — Encounter: Payer: Self-pay | Admitting: Internal Medicine

## 2020-09-02 ENCOUNTER — Ambulatory Visit: Payer: Medicaid Other | Admitting: Gastroenterology

## 2020-09-14 ENCOUNTER — Encounter: Payer: Self-pay | Admitting: Nurse Practitioner

## 2020-09-14 ENCOUNTER — Other Ambulatory Visit: Payer: Self-pay

## 2020-09-14 ENCOUNTER — Ambulatory Visit: Payer: Medicaid Other | Admitting: Nurse Practitioner

## 2020-09-14 VITALS — BP 131/87 | HR 97 | Temp 98.1°F | Ht 64.0 in | Wt 247.8 lb

## 2020-09-14 DIAGNOSIS — F32A Depression, unspecified: Secondary | ICD-10-CM | POA: Diagnosis not present

## 2020-09-14 DIAGNOSIS — F908 Attention-deficit hyperactivity disorder, other type: Secondary | ICD-10-CM

## 2020-09-14 DIAGNOSIS — F988 Other specified behavioral and emotional disorders with onset usually occurring in childhood and adolescence: Secondary | ICD-10-CM | POA: Insufficient documentation

## 2020-09-14 NOTE — Progress Notes (Signed)
Established Patient Office Visit  Subjective:  Patient ID: Kimberly Green, female    DOB: 1960/12/08  Age: 59 y.o. MRN: [id number]  CC:  Chief Complaint  Patient presents with  . Discuss ADD    HPI Kimberly Green presents for follow-up of ADD.  Patient is reporting in the last few years she was evaluated by a psychiatrist for ADD and has been getting Atarax for symptom management.  Patient is in clinic today to continue with Atarax prescriptions due to current psych clinic closing.  Patient is reporting compliance with medications, she is taking medication as prescribed with no side effects and following up as directed.  Depression: Patient complains of depression. She complains of depressed mood and difficulty concentrating. Onset was approximately 2 years ago, stable since that time.  She denies current suicidal and homicidal plan or intent.   Family history significant for no psychiatric illness.Possible organic causes contributing are: none.  Risk factors: previous episode of depression Previous treatment includes Wellbutrin and  She complains of the following side effects from the treatment: none.  Past Medical History:  Diagnosis Date  . Adrenal tumor, benign    S/P L adrenal resection, 10/11 ([place])  . Bipolar disorder ([place])   . Cervical cancer ([place]) 1987  . Cholelithiasis   . COPD (chronic obstructive pulmonary disease) ([place])   . Diabetes mellitus without complication ([place])   . Essential hypertension, benign   . IBS (irritable bowel syndrome)   . Mixed hyperlipidemia   . PTSD (post-traumatic stress disorder)   . Renal carcinoma ([place])    S/P partial R nephrectomy (1/3 removed), 8/11 ([place])  . Sleep apnea     Past Surgical History:  Procedure Laterality Date  . ADRENAL GLAND SURGERY    . BACK SURGERY    . CERVICAL CONE BIOPSY     cervical cancer  . CHOLECYSTECTOMY    . KIDNEY MASS REMOVAL    . NECK SURGERY    . RIGHT WRIST SURGERY    . SHOULDER SURGERY  Left   . TUBAL LIGATION      Family History  Problem Relation Age of Onset  . Coronary artery disease Mother 72  . COPD Mother   . Hypertension Mother   . Breast cancer Mother   . Uterine cancer Mother   . Heart failure Father 81  . Heart disease Father   . Colon cancer Other        Aunt  . Lung cancer Other        Aunt  . Diabetes Sister   . Heart disease Sister   . Heart failure Maternal Grandmother   . Heart failure Paternal Grandmother   . Cerebral palsy Brother   . Early death Brother        MVA  . Diabetes Sister   . Heart disease Sister   . Valvular heart disease Sister   . Early death Sister        Murdered  . Uterine cancer Daughter     Social History   Socioeconomic History  . Marital status: Divorced    Spouse name: Not on file  . Number of children: 2  . Years of education: 71  . Highest education level: Not on file  Occupational History  . Not on file  Tobacco Use  . Smoking status: Current Every Day Smoker    Packs/day: 0.25    Years: 45.00    Pack years: 11.25    Types: Cigarettes  .  Smokeless tobacco: Never Used  Vaping Use  . Vaping Use: Never used  Substance and Sexual Activity  . Alcohol use: No  . Drug use: No  . Sexual activity: Not Currently  Other Topics Concern  . Not on file  Social History Narrative   Drinks 1 cup of coffee a day    Social Determinants of Health   Financial Resource Strain:   . Difficulty of Paying Living Expenses: Not on file  Food Insecurity:   . Worried About Charity fundraiser in the Last Year: Not on file  . Ran Out of Food in the Last Year: Not on file  Transportation Needs:   . Lack of Transportation (Medical): Not on file  . Lack of Transportation (Non-Medical): Not on file  Physical Activity:   . Days of Exercise per Week: Not on file  . Minutes of Exercise per Session: Not on file  Stress:   . Feeling of Stress : Not on file  Social Connections:   . Frequency of Communication with Friends  and Family: Not on file  . Frequency of Social Gatherings with Friends and Family: Not on file  . Attends Religious Services: Not on file  . Active Member of Clubs or Organizations: Not on file  . Attends Archivist Meetings: Not on file  . Marital Status: Not on file  Intimate Partner Violence:   . Fear of Current or Ex-Partner: Not on file  . Emotionally Abused: Not on file  . Physically Abused: Not on file  . Sexually Abused: Not on file    Outpatient Medications Prior to Visit  Medication Sig Dispense Refill  . albuterol (PROVENTIL) (2.5 MG/3ML) 0.083% nebulizer solution INHALE THE CONTENTS OF ONE VIAL (3ML) VIA NEBULIZER EVERY 6 HOURS AS NEEDED 90 mL 2  . albuterol (VENTOLIN HFA) 108 (90 Base) MCG/ACT inhaler Inhale 1-2 puffs into the lungs every 6 (six) hours as needed for wheezing or shortness of breath. 1 each 3  . amphetamine-dextroamphetamine (ADDERALL) 10 MG tablet TAKE 1 TABLET BY MOUTH EVERY MORNING AND TAKE 1 TABLET BY MOUTH EVERY DAY AT NOON - MAY FILL IN JULY  0  . atorvastatin (LIPITOR) 20 MG tablet Take 1 tablet (20 mg total) by mouth daily. Must have follow for further refills 15 tablet 0  . cetirizine (ZYRTEC) 10 MG tablet Take 1 tablet (10 mg total) by mouth daily. 90 tablet 0  . dapagliflozin propanediol (FARXIGA) 10 MG TABS tablet TAKE 1 TABLET BY MOUTH EVERY DAY - 30 tablet 2  . fluticasone (FLOVENT HFA) 110 MCG/ACT inhaler Inhale 2 puffs into the lungs 2 (two) times daily. 2 each 3  . gabapentin (NEURONTIN) 300 MG capsule Take 1 capsule (300 mg total) by mouth 3 (three) times daily. 90 capsule 2  . glimepiride (AMARYL) 1 MG tablet TAKE 1 TABLET BY MOUTH EVERY DAY with breakfast 30 tablet 2  . glucose blood (ACCU-CHEK AVIVA PLUS) test strip Use to check BG twice daily 100 each 2  . ipratropium-albuterol (DUONEB) 0.5-2.5 (3) MG/3ML SOLN Take 3 mLs by nebulization every 6 (six) hours as needed. 360 mL 2  . Lancets (ACCU-CHEK SOFT TOUCH) lancets Use to check  BG twice a day 100 each 2  . lisinopril (ZESTRIL) 2.5 MG tablet Take 1 tablet (2.5 mg total) by mouth daily. 90 tablet 0  . metFORMIN (GLUCOPHAGE) 1000 MG tablet TAKE 1 TABLET BY MOUTH TWICE DAILY with a meal 60 tablet 2  . QUEtiapine (SEROQUEL) 100  MG tablet Take 2 tablets (200 mg total) by mouth at bedtime. 60 tablet 2  . salicylic acid-lactic acid 17 % external solution Apply topically daily. 14 mL 0   No facility-administered medications prior to visit.    Allergies  Allergen Reactions  . Lexiscan [Regadenoson] Shortness Of Breath    Severe transient respiratory distress    ROS Review of Systems  Psychiatric/Behavioral: Negative for behavioral problems and self-injury. The patient is not nervous/anxious.   All other systems reviewed and are negative.     Objective:    Physical Exam Vitals reviewed.  HENT:     Head: Normocephalic.     Nose: Nose normal.  Eyes:     Conjunctiva/sclera: Conjunctivae normal.  Cardiovascular:     Rate and Rhythm: Normal rate and regular rhythm.     Pulses: Normal pulses.     Heart sounds: Normal heart sounds.  Pulmonary:     Effort: Pulmonary effort is normal.     Breath sounds: Normal breath sounds.  Abdominal:     General: Bowel sounds are normal.  Musculoskeletal:        General: Normal range of motion.  Skin:    General: Skin is warm.  Neurological:     Mental Status: She is alert and oriented to person, place, and time.  Psychiatric:        Mood and Affect: Mood normal.     Comments: ADD     BP 131/87   Pulse 97   Temp 98.1 F (36.7 C) (Temporal)   Ht 5\' 4"  (1.626 m)   Wt 247 lb 12.8 oz (112.4 kg)   LMP 10/15/2015   BMI 42.53 kg/m  Wt Readings from Last 3 Encounters:  09/14/20 247 lb 12.8 oz (112.4 kg)  07/24/20 250 lb (113.4 kg)  07/20/20 248 lb 3.2 oz (112.6 kg)     Health Maintenance Due  Topic Date Due  . OPHTHALMOLOGY EXAM  Never done  . COVID-19 Vaccine (1) Never done  . Fecal DNA (Cologuard)  Never  done  . MAMMOGRAM  08/04/2019  . FOOT EXAM  11/29/2019      Lab Results  Component Value Date   TSH 4.430 07/19/2017   Lab Results  Component Value Date   WBC 8.4 07/24/2020   HGB 14.7 07/24/2020   HCT 43.2 07/24/2020   MCV 86 07/24/2020   PLT 255 07/24/2020   Lab Results  Component Value Date   NA 142 07/24/2020   K 3.9 07/24/2020   CO2 23 07/24/2020   GLUCOSE 87 07/24/2020   BUN 9 07/24/2020   CREATININE 0.69 07/24/2020   BILITOT 0.3 07/24/2020   ALKPHOS 89 07/24/2020   AST 53 (H) 07/24/2020   ALT 57 (H) 07/24/2020   PROT 6.9 07/24/2020   ALBUMIN 4.5 07/24/2020   CALCIUM 10.1 07/24/2020   Lab Results  Component Value Date   CHOL 122 07/24/2020   Lab Results  Component Value Date   HDL 37 (L) 07/24/2020   Lab Results  Component Value Date   LDLCALC 52 07/24/2020   Lab Results  Component Value Date   TRIG 204 (H) 07/24/2020   Lab Results  Component Value Date   CHOLHDL 3.3 07/24/2020   Lab Results  Component Value Date   HGBA1C 6.7 07/24/2020      Assessment & Plan:   Problem List Items Addressed This Visit      Other   Depression    Well-controlled on Wellbutrin Xl 300  mg tablet daily       Attention deficit disorder - Primary    Well-controlled on current medication patient will follow up with psychiatry in few weeks.         No orders of the defined types were placed in this encounter.   Follow-up: Return if symptoms worsen or fail to improve.    Ivy Shaasia, NP

## 2020-09-14 NOTE — Patient Instructions (Signed)

## 2020-09-14 NOTE — Assessment & Plan Note (Signed)
Well-controlled on Wellbutrin Xl 300 mg tablet daily

## 2020-09-14 NOTE — Assessment & Plan Note (Signed)
Well-controlled on current medication patient will follow up with psychiatry in few weeks.

## 2020-09-16 ENCOUNTER — Telehealth: Payer: Self-pay

## 2020-09-17 NOTE — Telephone Encounter (Signed)
Please advise 

## 2020-09-18 ENCOUNTER — Other Ambulatory Visit: Payer: Self-pay | Admitting: Nurse Practitioner

## 2020-09-18 MED ORDER — AMPHETAMINE-DEXTROAMPHETAMINE 10 MG PO TABS: 10.0000 mg | ORAL_TABLET | Freq: Two times a day (BID) | ORAL | 0 refills | Status: DC

## 2020-09-18 NOTE — Telephone Encounter (Signed)
Rx sent to pharmacy   

## 2020-09-18 NOTE — Telephone Encounter (Signed)
Since Kimberly Green is her PCP I do not want to say yes or no and allow her PCP to make the decision to treat her ADD as an adult patient but if she needs her ADD medication refill until PCP returns I can definitely do that for her.

## 2020-09-18 NOTE — Telephone Encounter (Signed)
Pt states she would like you to refill it for now and she does understand she will have to discuss this with Britney to see if she will continue to fill or if she will need to go to psych.

## 2020-09-18 NOTE — Telephone Encounter (Signed)
You saw this patient on 09/14/20 and she is calling back to see if her ADD can be treated here instead of at psychiatry.  She said she had spoken to you about this and you were going to check to see if this is possible.  Please advise.

## 2020-09-21 NOTE — Telephone Encounter (Signed)
Lmtcb  When returning call- let patient know that the requested gift has been sent in.

## 2020-09-22 ENCOUNTER — Other Ambulatory Visit: Payer: Self-pay | Admitting: *Deleted

## 2020-09-22 DIAGNOSIS — E785 Hyperlipidemia, unspecified: Secondary | ICD-10-CM

## 2020-09-22 MED ORDER — ATORVASTATIN CALCIUM 20 MG PO TABS: 20.0000 mg | ORAL_TABLET | Freq: Every day | ORAL | 4 refills | Status: DC

## 2020-10-07 ENCOUNTER — Other Ambulatory Visit (HOSPITAL_COMMUNITY): Payer: Self-pay | Admitting: Family Medicine

## 2020-10-07 DIAGNOSIS — Z1231 Encounter for screening mammogram for malignant neoplasm of breast: Secondary | ICD-10-CM

## 2020-10-08 ENCOUNTER — Encounter: Payer: Self-pay | Admitting: *Deleted

## 2020-10-23 ENCOUNTER — Ambulatory Visit: Payer: Medicaid Other | Admitting: Gastroenterology

## 2020-11-12 DIAGNOSIS — B349 Viral infection, unspecified: Secondary | ICD-10-CM | POA: Diagnosis not present

## 2020-11-12 DIAGNOSIS — U071 COVID-19: Secondary | ICD-10-CM | POA: Diagnosis not present

## 2020-11-12 DIAGNOSIS — R059 Cough, unspecified: Secondary | ICD-10-CM | POA: Diagnosis not present

## 2020-11-12 DIAGNOSIS — R0602 Shortness of breath: Secondary | ICD-10-CM | POA: Diagnosis not present

## 2020-11-19 DIAGNOSIS — J441 Chronic obstructive pulmonary disease with (acute) exacerbation: Secondary | ICD-10-CM | POA: Diagnosis not present

## 2020-11-19 DIAGNOSIS — J1282 Pneumonia due to coronavirus disease 2019: Secondary | ICD-10-CM | POA: Diagnosis not present

## 2020-11-19 DIAGNOSIS — R0902 Hypoxemia: Secondary | ICD-10-CM | POA: Diagnosis not present

## 2020-11-19 DIAGNOSIS — Z720 Tobacco use: Secondary | ICD-10-CM | POA: Diagnosis not present

## 2020-11-19 DIAGNOSIS — U071 COVID-19: Secondary | ICD-10-CM | POA: Diagnosis not present

## 2020-11-19 DIAGNOSIS — E119 Type 2 diabetes mellitus without complications: Secondary | ICD-10-CM | POA: Diagnosis not present

## 2020-11-23 ENCOUNTER — Other Ambulatory Visit: Payer: Self-pay | Admitting: *Deleted

## 2020-11-23 DIAGNOSIS — E119 Type 2 diabetes mellitus without complications: Secondary | ICD-10-CM

## 2020-11-23 DIAGNOSIS — F32A Depression, unspecified: Secondary | ICD-10-CM

## 2020-11-23 MED ORDER — METFORMIN HCL 1000 MG PO TABS: ORAL_TABLET | ORAL | 1 refills | Status: DC

## 2020-11-23 MED ORDER — GLIMEPIRIDE 1 MG PO TABS: ORAL_TABLET | ORAL | 1 refills | Status: DC

## 2020-11-23 MED ORDER — GABAPENTIN 300 MG PO CAPS
300.0000 mg | ORAL_CAPSULE | Freq: Three times a day (TID) | ORAL | 1 refills | Status: DC
Start: 2020-11-23 — End: 2021-02-01

## 2020-11-23 MED ORDER — DAPAGLIFLOZIN PROPANEDIOL 10 MG PO TABS: ORAL_TABLET | ORAL | 1 refills | Status: DC

## 2020-11-24 MED ORDER — QUETIAPINE FUMARATE 100 MG PO TABS: 200.0000 mg | ORAL_TABLET | Freq: Every day | ORAL | 1 refills | Status: DC

## 2020-12-14 ENCOUNTER — Telehealth: Payer: Self-pay

## 2021-01-19 ENCOUNTER — Ambulatory Visit: Payer: Medicaid Other | Admitting: Family Medicine

## 2021-01-29 ENCOUNTER — Ambulatory Visit: Payer: Medicaid Other | Admitting: Family Medicine

## 2021-01-31 ENCOUNTER — Other Ambulatory Visit: Payer: Self-pay | Admitting: Family Medicine

## 2021-01-31 DIAGNOSIS — F32A Depression, unspecified: Secondary | ICD-10-CM

## 2021-01-31 DIAGNOSIS — E119 Type 2 diabetes mellitus without complications: Secondary | ICD-10-CM

## 2021-02-01 NOTE — Telephone Encounter (Signed)
Patient is due for a follow-up.  It has been greater than 6 months since her last A1c.

## 2021-02-02 NOTE — Telephone Encounter (Signed)
Patient aware and verbalizes understanding. 

## 2021-02-17 ENCOUNTER — Other Ambulatory Visit: Payer: Self-pay

## 2021-02-17 ENCOUNTER — Ambulatory Visit (INDEPENDENT_AMBULATORY_CARE_PROVIDER_SITE_OTHER): Payer: Medicaid Other | Admitting: Family Medicine

## 2021-02-17 ENCOUNTER — Encounter: Payer: Self-pay | Admitting: Family Medicine

## 2021-02-17 VITALS — BP 126/73 | HR 97 | Temp 98.5°F | Ht 64.0 in | Wt 248.8 lb

## 2021-02-17 DIAGNOSIS — R748 Abnormal levels of other serum enzymes: Secondary | ICD-10-CM

## 2021-02-17 DIAGNOSIS — R053 Chronic cough: Secondary | ICD-10-CM | POA: Diagnosis not present

## 2021-02-17 DIAGNOSIS — R7401 Elevation of levels of liver transaminase levels: Secondary | ICD-10-CM | POA: Diagnosis not present

## 2021-02-17 DIAGNOSIS — M19049 Primary osteoarthritis, unspecified hand: Secondary | ICD-10-CM

## 2021-02-17 DIAGNOSIS — M65332 Trigger finger, left middle finger: Secondary | ICD-10-CM | POA: Diagnosis not present

## 2021-02-17 DIAGNOSIS — E119 Type 2 diabetes mellitus without complications: Secondary | ICD-10-CM

## 2021-02-17 DIAGNOSIS — Z6841 Body Mass Index (BMI) 40.0 and over, adult: Secondary | ICD-10-CM | POA: Diagnosis not present

## 2021-02-17 DIAGNOSIS — F332 Major depressive disorder, recurrent severe without psychotic features: Secondary | ICD-10-CM | POA: Diagnosis not present

## 2021-02-17 DIAGNOSIS — Z0000 Encounter for general adult medical examination without abnormal findings: Secondary | ICD-10-CM

## 2021-02-17 DIAGNOSIS — J449 Chronic obstructive pulmonary disease, unspecified: Secondary | ICD-10-CM | POA: Diagnosis not present

## 2021-02-17 DIAGNOSIS — E1169 Type 2 diabetes mellitus with other specified complication: Secondary | ICD-10-CM | POA: Diagnosis not present

## 2021-02-17 DIAGNOSIS — F32A Depression, unspecified: Secondary | ICD-10-CM

## 2021-02-17 DIAGNOSIS — E785 Hyperlipidemia, unspecified: Secondary | ICD-10-CM | POA: Diagnosis not present

## 2021-02-17 DIAGNOSIS — I10 Essential (primary) hypertension: Secondary | ICD-10-CM | POA: Diagnosis not present

## 2021-02-18 ENCOUNTER — Other Ambulatory Visit: Payer: Self-pay | Admitting: Family Medicine

## 2021-02-18 DIAGNOSIS — E785 Hyperlipidemia, unspecified: Secondary | ICD-10-CM

## 2021-02-18 LAB — CBC WITH DIFFERENTIAL/PLATELET
Basophils Absolute: 0.1 10*3/uL (ref 0.0–0.2)
Basos: 1 %
EOS (ABSOLUTE): 0.3 10*3/uL (ref 0.0–0.4)
Eos: 4 %
Hematocrit: 43.7 % (ref 34.0–46.6)
Hemoglobin: 15.2 g/dL (ref 11.1–15.9)
Immature Grans (Abs): 0 10*3/uL (ref 0.0–0.1)
Immature Granulocytes: 0 %
Lymphocytes Absolute: 2.4 10*3/uL (ref 0.7–3.1)
Lymphs: 35 %
MCH: 30 pg (ref 26.6–33.0)
MCHC: 34.8 g/dL (ref 31.5–35.7)
MCV: 86 fL (ref 79–97)
Monocytes Absolute: 0.4 10*3/uL (ref 0.1–0.9)
Monocytes: 6 %
Neutrophils Absolute: 3.6 10*3/uL (ref 1.4–7.0)
Neutrophils: 54 %
Platelets: 211 10*3/uL (ref 150–450)
RBC: 5.06 x10E6/uL (ref 3.77–5.28)
RDW: 12.9 % (ref 11.7–15.4)
WBC: 6.7 10*3/uL (ref 3.4–10.8)

## 2021-02-18 LAB — CMP14+EGFR
ALT: 73 IU/L — ABNORMAL HIGH (ref 0–32)
AST: 64 IU/L — ABNORMAL HIGH (ref 0–40)
Albumin/Globulin Ratio: 1.7 (ref 1.2–2.2)
Albumin: 4.3 g/dL (ref 3.8–4.9)
Alkaline Phosphatase: 88 IU/L (ref 44–121)
BUN/Creatinine Ratio: 13 (ref 9–23)
BUN: 9 mg/dL (ref 6–24)
Bilirubin Total: <0.2 mg/dL (ref 0.0–1.2)
CO2: 23 mmol/L (ref 20–29)
Calcium: 10.1 mg/dL (ref 8.7–10.2)
Chloride: 103 mmol/L (ref 96–106)
Creatinine, Ser: 0.68 mg/dL (ref 0.57–1.00)
Globulin, Total: 2.6 g/dL (ref 1.5–4.5)
Glucose: 109 mg/dL — ABNORMAL HIGH (ref 65–99)
Potassium: 3.9 mmol/L (ref 3.5–5.2)
Sodium: 141 mmol/L (ref 134–144)
Total Protein: 6.9 g/dL (ref 6.0–8.5)
eGFR: 101 mL/min/{1.73_m2} (ref >59)

## 2021-02-18 LAB — LIPID PANEL
Chol/HDL Ratio: 8.9 ratio — ABNORMAL HIGH (ref 0.0–4.4)
Cholesterol, Total: 250 mg/dL — ABNORMAL HIGH (ref 100–199)
HDL: 28 mg/dL — ABNORMAL LOW (ref >39)
LDL Chol Calc (NIH): 129 mg/dL — ABNORMAL HIGH (ref 0–99)
Triglycerides: 511 mg/dL — ABNORMAL HIGH (ref 0–149)
VLDL Cholesterol Cal: 93 mg/dL — ABNORMAL HIGH (ref 5–40)

## 2021-02-18 LAB — BAYER DCA HB A1C WAIVED: HB A1C (BAYER DCA - WAIVED): 6.5 % (ref <7.0)

## 2021-02-18 MED ORDER — ATORVASTATIN CALCIUM 20 MG PO TABS: 20.0000 mg | ORAL_TABLET | Freq: Every day | ORAL | 1 refills | Status: DC

## 2021-02-20 LAB — ACUTE VIRAL HEPATITIS (HAV, HBV, HCV)
HCV Ab: <0.1 s/co ratio (ref 0.0–0.9)
Hep A IgM: NEGATIVE
Hep B C IgM: NEGATIVE
Hepatitis B Surface Ag: NEGATIVE

## 2021-02-20 LAB — HCV INTERPRETATION

## 2021-02-20 LAB — GAMMA GT: GGT: 43 IU/L (ref 0–60)

## 2021-02-20 LAB — SPECIMEN STATUS REPORT

## 2021-02-21 ENCOUNTER — Encounter: Payer: Self-pay | Admitting: Family Medicine

## 2021-02-21 MED ORDER — METFORMIN HCL 1000 MG PO TABS: 1.0000 | ORAL_TABLET | Freq: Two times a day (BID) | ORAL | 1 refills | Status: DC

## 2021-02-21 MED ORDER — QUETIAPINE FUMARATE 100 MG PO TABS
200.0000 mg | ORAL_TABLET | Freq: Every day | ORAL | 1 refills | Status: DC
Start: 2021-02-21 — End: 2021-12-16

## 2021-02-21 MED ORDER — DAPAGLIFLOZIN PROPANEDIOL 10 MG PO TABS: 10.0000 mg | ORAL_TABLET | Freq: Every day | ORAL | 1 refills | Status: DC

## 2021-02-21 MED ORDER — GLIMEPIRIDE 1 MG PO TABS: 1.0000 mg | ORAL_TABLET | Freq: Every day | ORAL | 1 refills | Status: DC

## 2021-02-21 NOTE — Progress Notes (Signed)
Assessment & Plan:  1. Essential hypertension Well controlled on current regimen.  - Lipid panel - CMP14+EGFR - CBC with Differential/Platelet  2. Hyperlipidemia associated with type 2 diabetes mellitus ([place]) Labs to assess. - Lipid panel - CMP14+EGFR  3. Diabetes mellitus without complication ([place]) Lab Results  Component Value Date   HGBA1C 6.5 02/17/2021   HGBA1C 6.7 07/24/2020   HGBA1C 6.4 07/20/2020    - Diabetes is at goal of A1c < 7. - Medications: continue current medications - Home glucose monitoring: not monitoring - Patient is currently taking a statin. Patient is taking an ACE-inhibitor/ARB.  - Instruction/counseling given: reminded to get eye exam and discussed foot care  Diabetes Health Maintenance Due  Topic Date Due  . OPHTHALMOLOGY EXAM  Never done  . URINE MICROALBUMIN  07/20/2021  . HEMOGLOBIN A1C  08/20/2021  . FOOT EXAM  02/17/2022    - Lipid panel - CMP14+EGFR - CBC with Differential/Platelet - Bayer DCA Hb A1c Waived - metFORMIN (GLUCOPHAGE) 1000 MG tablet; Take 1 tablet (1,000 mg total) by mouth 2 (two) times daily with a meal.  Dispense: 180 tablet; Refill: 1 - dapagliflozin propanediol (FARXIGA) 10 MG TABS tablet; Take 1 tablet (10 mg total) by mouth daily.  Dispense: 90 tablet; Refill: 1 - glimepiride (AMARYL) 1 MG tablet; Take 1 tablet (1 mg total) by mouth daily with breakfast.  Dispense: 90 tablet; Refill: 1  4. Severe episode of recurrent major depressive disorder, without psychotic features ([place]) Uncontrolled. GeneSight testing completed today. - CMP14+EGFR - QUEtiapine (SEROQUEL) 100 MG tablet; Take 2 tablets (200 mg total) by mouth at bedtime.  Dispense: 180 tablet; Refill: 1  5. Chronic obstructive pulmonary disease, unspecified COPD type ([place]) Encouraged to use Flovent daily and not as needed.  6. Chronic cough - Ambulatory referral to ENT  7. Arthritis of hand Advised to call [facility] and schedule her appointment as a  referral was previously placed.   8. Trigger middle finger of left hand Advised to call [facility] and schedule her appointment as a referral was previously placed.   9. Elevated liver enzymes - Acute Viral Hepatitis (HAV, HBV, HCV) - Gamma GT - Specimen status report  10. Morbid obesity with BMI of 40.0-44.9, adult ([place]) Diet and exercise encouraged.  11. Healthcare maintenance Encouraged to complete Cologuard that she has at home. Mammogram scheduled for 03/03/21. Declined COVID vaccines.    Return in about 2 months (around 04/19/2021) for depression, cough.  [name], MSN, APRN, FNP-C [facility]  Subjective:    Patient ID: Kimberly Green, female    DOB: 25-Mar-1961, 60 y.o.   MRN: [id number]  Patient Care Team: Loman Brooklyn, FNP as PCP - General (Family Medicine) Ryan  as Consulting Physician (Psychiatry) Georga Bora, MD as Consulting Physician (Neurosurgery) Eloise Harman, DO as Consulting Physician (Internal Medicine)   Chief Complaint:  Chief Complaint  Patient presents with  . Diabetes    Check up of chronic medical conditions   . Nausea    X 6-7 months. Patient states she also has bloating with eating.  Wanting to know if it could be from the hernia?   . Referral    Tendonitis     HPI: Kimberly Green is a 60 y.o. female presenting on 02/17/2021 for Diabetes (Check up of chronic medical conditions ) and Referral (Tendonitis )  Diabetes: Patient presents for follow up of diabetes. Current symptoms include: none. Known diabetic complications: none. Medication  compliance: yes. Current diet: trying to eat healthy. Current exercise: none. Home blood sugar records: patient does not check sugars. Is she  on ACE inhibitor or angiotensin II receptor blocker? Yes. Is she on a statin? Yes. Patient has never had a diabetic eye exam.   COPD: patient has both an Albuterol and Flovent inhaler which she is using  both as needed. Wheezing present. She has a chronic cough that she states has been present since she was 77 months old when they "knicked her larynx". She reports she has been told that she has an esophageal spasm causing her cough. She is currently taking Gabapentin for the cough, but continues to cough frequently. She would like to be referred to an ENT for further assessment.   Depression: patient states she quit going to her psychiatrist. She has been out of Adderall x1 month. States she was given this to boost her mood and activity. The only other associated medication she is taking is Seroquel, which she reports she takes for sleep. Patient states she has seen numerous psychiatrists since 2012 and has failed therapy with Lithium, Clonazepam, Xanax, Zoloft, Celexa, Prozac, and Trazodone. She is not sure about Effexor and Lexapro.   Depression screen [facility] 2/9 02/17/2021 09/14/2020 07/24/2020  Decreased Interest 3 2 3   Down, Depressed, Hopeless 3 2 3   PHQ - 2 Score 6 4 6   Altered sleeping 3 2 3   Tired, decreased energy 3 2 3   Change in appetite 3 2 3   Feeling bad or failure about yourself  3 2 3   Trouble concentrating 3 2 3   Moving slowly or fidgety/restless 3 0 0  Suicidal thoughts 3 1 1   PHQ-9 Score 27 15 22   Difficult doing work/chores Very difficult Not difficult at all Not difficult at all  Some recent data might be hidden   Patient reports her thoughts are that she would be better off dead, not of harming herself.   GAD 7 : Generalized Anxiety Score 02/17/2021 09/14/2020 07/20/2020 09/12/2019  Nervous, Anxious, on Edge 3 2 3 3   Control/stop worrying 3 2 3 3   Worry too much - different things 3 2 3 3   Trouble relaxing 3 2 3  0  Restless 3 2 3 1   Easily annoyed or irritable 3 2 - 3  Afraid - awful might happen 2 1 2 3   Total GAD 7 Score 20 13 - 16  Anxiety Difficulty Very difficult Somewhat difficult Very difficult Very difficult    New complaints: Patient states she needs to see a  hand doctor. She was previously referred to Dr. Amedeo Plenty and did not schedule as she reports it needs to be after school is out for summer.   Social history:  Relevant past medical, surgical, family and social history reviewed and updated as indicated. Interim medical history since our last visit reviewed.  Allergies and medications reviewed and updated.  DATA REVIEWED: CHART IN EPIC  ROS: Negative unless specifically indicated above in HPI.    Current Outpatient Medications:  .  albuterol (PROVENTIL) (2.5 MG/3ML) 0.083% nebulizer solution, INHALE THE CONTENTS OF ONE VIAL (3ML) VIA NEBULIZER EVERY 6 HOURS AS NEEDED, Disp: 90 mL, Rfl: 2 .  albuterol (VENTOLIN HFA) 108 (90 Base) MCG/ACT inhaler, Inhale 1-2 puffs into the lungs every 6 (six) hours as needed for wheezing or shortness of breath., Disp: 1 each, Rfl: 3 .  amphetamine-dextroamphetamine (ADDERALL) 10 MG tablet, Take 1 tablet (10 mg total) by mouth 2 (two) times daily., Disp: 60 tablet,  Rfl: 0 .  amphetamine-dextroamphetamine (ADDERALL) 10 MG tablet, Take 1 tablet (10 mg total) by mouth 2 (two) times daily., Disp: 60 tablet, Rfl: 0 .  cetirizine (ZYRTEC) 10 MG tablet, Take 1 tablet (10 mg total) by mouth daily., Disp: 90 tablet, Rfl: 0 .  FARXIGA 10 MG TABS tablet, TAKE 1 TABLET BY MOUTH EVERY DAY, Disp: 30 tablet, Rfl: 1 .  gabapentin (NEURONTIN) 300 MG capsule, TAKE ONE CAPSULE BY MOUTH THREE TIMES DAILY, Disp: 90 capsule, Rfl: 1 .  glimepiride (AMARYL) 1 MG tablet, TAKE 1 TABLET BY MOUTH DAILY WITH BREAKFAST, Disp: 30 tablet, Rfl: 1 .  glucose blood (ACCU-CHEK AVIVA PLUS) test strip, Use to check BG twice daily, Disp: 100 each, Rfl: 2 .  ipratropium-albuterol (DUONEB) 0.5-2.5 (3) MG/3ML SOLN, Take 3 mLs by nebulization every 6 (six) hours as needed., Disp: 360 mL, Rfl: 2 .  Lancets (ACCU-CHEK SOFT TOUCH) lancets, Use to check BG twice a day, Disp: 100 each, Rfl: 2 .  metFORMIN (GLUCOPHAGE) 1000 MG tablet, TAKE 1 TABLET BY MOUTH TWICE  DAILY WITH A MEAL, Disp: 60 tablet, Rfl: 1 .  QUEtiapine (SEROQUEL) 100 MG tablet, TAKE TWO TABLETS BY MOUTH AT BEDTIME, Disp: 60 tablet, Rfl: 1 .  salicylic acid-lactic acid 17 % external solution, Apply topically daily., Disp: 14 mL, Rfl: 0 .  atorvastatin (LIPITOR) 20 MG tablet, Take 1 tablet (20 mg total) by mouth daily., Disp: 90 tablet, Rfl: 1   Allergies  Allergen Reactions  . Lexiscan [Regadenoson] Shortness Of Breath    Severe transient respiratory distress  . Niacin Itching   Past Medical History:  Diagnosis Date  . Adrenal tumor, benign    S/P L adrenal resection, 10/11 ([place])  . Bipolar disorder ([place])   . Cervical cancer ([place]) 1987  . Cholelithiasis   . COPD (chronic obstructive pulmonary disease) ([place])   . Diabetes mellitus without complication ([place])   . Essential hypertension, benign   . IBS (irritable bowel syndrome)   . Mixed hyperlipidemia   . PTSD (post-traumatic stress disorder)   . Renal carcinoma ([place])    S/P partial R nephrectomy (1/3 removed), 8/11 ([place])  . Sleep apnea     Past Surgical History:  Procedure Laterality Date  . ADRENAL GLAND SURGERY    . BACK SURGERY    . CERVICAL CONE BIOPSY     cervical cancer  . CHOLECYSTECTOMY    . KIDNEY MASS REMOVAL    . NECK SURGERY    . RIGHT WRIST SURGERY    . SHOULDER SURGERY Left   . TUBAL LIGATION      Social History   Socioeconomic History  . Marital status: Divorced    Spouse name: Not on file  . Number of children: 2  . Years of education: 14  . Highest education level: Not on file  Occupational History  . Not on file  Tobacco Use  . Smoking status: Current Every Day Smoker    Packs/day: 0.25    Years: 45.00    Pack years: 11.25    Types: Cigarettes  . Smokeless tobacco: Never Used  Vaping Use  . Vaping Use: Never used  Substance and Sexual Activity  . Alcohol use: No  . Drug use: No  . Sexual activity: Not Currently  Other Topics Concern  . Not on file  Social History Narrative    Drinks 1 cup of coffee a day    Social Determinants of Health   Financial Resource Strain: Not on  file  Food Insecurity: Not on file  Transportation Needs: Not on file  Physical Activity: Not on file  Stress: Not on file  Social Connections: Not on file  Intimate Partner Violence: Not on file        Objective:    BP 126/73   Pulse 97   Temp 98.5 F (36.9 C) (Temporal)   Ht $R'5\' 4"'UB$  (1.626 m)   Wt 248 lb 12.8 oz (112.9 kg)   LMP 10/15/2015   SpO2 97%   BMI 42.71 kg/m   Wt Readings from Last 3 Encounters:  02/17/21 248 lb 12.8 oz (112.9 kg)  09/14/20 247 lb 12.8 oz (112.4 kg)  07/24/20 250 lb (113.4 kg)    Physical Exam Vitals reviewed.  Constitutional:      General: She is not in acute distress.    Appearance: Normal appearance. She is morbidly obese. She is not ill-appearing, toxic-appearing or diaphoretic.  HENT:     Head: Normocephalic and atraumatic.  Eyes:     General: No scleral icterus.       Right eye: No discharge.        Left eye: No discharge.     Conjunctiva/sclera: Conjunctivae normal.  Cardiovascular:     Rate and Rhythm: Normal rate and regular rhythm.     Heart sounds: Normal heart sounds. No murmur heard. No friction rub. No gallop.   Pulmonary:     Effort: Pulmonary effort is normal. No respiratory distress.     Breath sounds: Normal breath sounds. No stridor. No wheezing, rhonchi or rales.  Musculoskeletal:        General: Normal range of motion.     Cervical back: Normal range of motion.  Skin:    General: Skin is warm and dry.     Capillary Refill: Capillary refill takes less than 2 seconds.  Neurological:     General: No focal deficit present.     Mental Status: She is alert and oriented to person, place, and time. Mental status is at baseline.  Psychiatric:        Mood and Affect: Mood normal.        Behavior: Behavior normal.        Thought Content: Thought content normal.        Judgment: Judgment normal.     Lab Results   Component Value Date   TSH 4.430 07/19/2017   Lab Results  Component Value Date   WBC 6.7 02/17/2021   HGB 15.2 02/17/2021   HCT 43.7 02/17/2021   MCV 86 02/17/2021   PLT 211 02/17/2021   Lab Results  Component Value Date   NA 141 02/17/2021   K 3.9 02/17/2021   CO2 23 02/17/2021   GLUCOSE 109 (H) 02/17/2021   BUN 9 02/17/2021   CREATININE 0.68 02/17/2021   BILITOT <0.2 02/17/2021   ALKPHOS 88 02/17/2021   AST 64 (H) 02/17/2021   ALT 73 (H) 02/17/2021   PROT 6.9 02/17/2021   ALBUMIN 4.3 02/17/2021   CALCIUM 10.1 02/17/2021   EGFR 101 02/17/2021   Lab Results  Component Value Date   CHOL 250 (H) 02/17/2021   Lab Results  Component Value Date   HDL 28 (L) 02/17/2021   Lab Results  Component Value Date   LDLCALC 129 (H) 02/17/2021   Lab Results  Component Value Date   TRIG 511 (H) 02/17/2021   Lab Results  Component Value Date   CHOLHDL 8.9 (H) 02/17/2021   Lab Results  Component Value Date   HGBA1C 6.5 02/17/2021

## 2021-02-23 ENCOUNTER — Telehealth: Payer: Self-pay | Admitting: Family Medicine

## 2021-02-23 ENCOUNTER — Other Ambulatory Visit: Payer: Self-pay | Admitting: Family Medicine

## 2021-02-23 DIAGNOSIS — F332 Major depressive disorder, recurrent severe without psychotic features: Secondary | ICD-10-CM

## 2021-02-23 MED ORDER — DESVENLAFAXINE SUCCINATE ER 25 MG PO TB24: 25.0000 mg | ORAL_TABLET | Freq: Every day | ORAL | 2 refills | Status: DC

## 2021-02-23 NOTE — Telephone Encounter (Signed)
Patient aware.

## 2021-02-23 NOTE — Telephone Encounter (Signed)
Please let patient know I received her GeneSight test results.  I sent a prescription for Pristiq 25 mg once daily to the pharmacy.  I will put a copy of the results in the mail to her.

## 2021-03-10 ENCOUNTER — Ambulatory Visit (INDEPENDENT_AMBULATORY_CARE_PROVIDER_SITE_OTHER): Payer: Medicaid Other | Admitting: Family Medicine

## 2021-03-10 ENCOUNTER — Encounter: Payer: Self-pay | Admitting: Family Medicine

## 2021-03-10 DIAGNOSIS — J441 Chronic obstructive pulmonary disease with (acute) exacerbation: Secondary | ICD-10-CM | POA: Diagnosis not present

## 2021-03-10 MED ORDER — METHYLPREDNISOLONE 4 MG PO TBPK: ORAL_TABLET | ORAL | 0 refills | Status: DC

## 2021-03-10 MED ORDER — AMOXICILLIN-POT CLAVULANATE 875-125 MG PO TABS: 1.0000 | ORAL_TABLET | Freq: Two times a day (BID) | ORAL | 0 refills | Status: AC

## 2021-03-10 MED ORDER — HYDROCOD POLST-CPM POLST ER 10-8 MG/5ML PO SUER: 5.0000 mL | Freq: Two times a day (BID) | ORAL | 0 refills | Status: DC | PRN

## 2021-03-10 NOTE — Progress Notes (Signed)
Virtual Visit via Telephone Note  I connected with Kimberly Green on 03/10/21 at 4:43 PM by telephone and verified that I am speaking with the correct person using two identifiers. Kimberly Green is currently located at home and her grandson is currently with her during this visit. The provider, Loman Brooklyn, FNP is located in their office at time of visit.  I discussed the limitations, risks, security and privacy concerns of performing an evaluation and management service by telephone and the availability of in person appointments. I also discussed with the patient that there may be a patient responsible charge related to this service. The patient expressed understanding and agreed to proceed.  Subjective: PCP: Loman Brooklyn, FNP  Chief Complaint  Patient presents with  . URI   Patient complains of cough, chest congestion and headache. Cough is productive of sputum that is thicker and increased from her normal. Onset of symptoms was 2 weeks ago. She is drinking plenty of fluids. Evaluation to date: none. Treatment to date: none. She has a history of COPD. She does smoke.    ROS: Per HPI  Current Outpatient Medications:  .  albuterol (PROVENTIL) (2.5 MG/3ML) 0.083% nebulizer solution, INHALE THE CONTENTS OF ONE VIAL (3ML) VIA NEBULIZER EVERY 6 HOURS AS NEEDED, Disp: 90 mL, Rfl: 2 .  albuterol (VENTOLIN HFA) 108 (90 Base) MCG/ACT inhaler, Inhale 1-2 puffs into the lungs every 6 (six) hours as needed for wheezing or shortness of breath., Disp: 1 each, Rfl: 3 .  atorvastatin (LIPITOR) 20 MG tablet, Take 1 tablet (20 mg total) by mouth daily., Disp: 90 tablet, Rfl: 1 .  cetirizine (ZYRTEC) 10 MG tablet, Take 1 tablet (10 mg total) by mouth daily., Disp: 90 tablet, Rfl: 0 .  dapagliflozin propanediol (FARXIGA) 10 MG TABS tablet, Take 1 tablet (10 mg total) by mouth daily., Disp: 90 tablet, Rfl: 1 .  Desvenlafaxine Succinate ER (PRISTIQ) 25 MG TB24, Take 25 mg by mouth  daily., Disp: 30 tablet, Rfl: 2 .  gabapentin (NEURONTIN) 300 MG capsule, TAKE ONE CAPSULE BY MOUTH THREE TIMES DAILY, Disp: 90 capsule, Rfl: 1 .  glucose blood (ACCU-CHEK AVIVA PLUS) test strip, Use to check BG twice daily, Disp: 100 each, Rfl: 2 .  ipratropium-albuterol (DUONEB) 0.5-2.5 (3) MG/3ML SOLN, Take 3 mLs by nebulization every 6 (six) hours as needed., Disp: 360 mL, Rfl: 2 .  Lancets (ACCU-CHEK SOFT TOUCH) lancets, Use to check BG twice a day, Disp: 100 each, Rfl: 2 .  metFORMIN (GLUCOPHAGE) 1000 MG tablet, Take 1 tablet (1,000 mg total) by mouth 2 (two) times daily with a meal., Disp: 180 tablet, Rfl: 1 .  QUEtiapine (SEROQUEL) 100 MG tablet, Take 2 tablets (200 mg total) by mouth at bedtime., Disp: 180 tablet, Rfl: 1 .  salicylic acid-lactic acid 17 % external solution, Apply topically daily., Disp: 14 mL, Rfl: 0  Allergies  Allergen Reactions  . Lexiscan [Regadenoson] Shortness Of Breath    Severe transient respiratory distress  . Niacin Itching   Past Medical History:  Diagnosis Date  . Adrenal tumor, benign    S/P L adrenal resection, 10/11 ([place])  . Bipolar disorder ([place])   . Cervical cancer ([place]) 1987  . Cholelithiasis   . COPD (chronic obstructive pulmonary disease) ([place])   . Diabetes mellitus without complication ([place])   . Essential hypertension, benign   . IBS (irritable bowel syndrome)   . Mixed hyperlipidemia   . PTSD (post-traumatic stress disorder)   .  Renal carcinoma ([place])    S/P partial R nephrectomy (1/3 removed), 8/11 ([place])  . Sleep apnea     Observations/Objective: A&O  No respiratory distress or wheezing audible over the phone Mood, judgement, and thought processes all WNL  Assessment and Plan: 1. COPD exacerbation ([place]) Discussed symptom management.  - amoxicillin-clavulanate (AUGMENTIN) 875-125 MG tablet; Take 1 tablet by mouth 2 (two) times daily for 7 days.  Dispense: 14 tablet; Refill: 0 - methylPREDNISolone (MEDROL DOSEPAK) 4 MG TBPK  tablet; Use as directed.  Dispense: 21 each; Refill: 0 - chlorpheniramine-HYDROcodone (TUSSIONEX) 10-8 MG/5ML SUER; Take 5 mLs by mouth every 12 (twelve) hours as needed for cough.  Dispense: 115 mL; Refill: 0   Follow Up Instructions:  I discussed the assessment and treatment plan with the patient. The patient was provided an opportunity to ask questions and all were answered. The patient agreed with the plan and demonstrated an understanding of the instructions.   The patient was advised to call back or seek an in-person evaluation if the symptoms worsen or if the condition fails to improve as anticipated.  The above assessment and management plan was discussed with the patient. The patient verbalized understanding of and has agreed to the management plan. Patient is aware to call the clinic if symptoms persist or worsen. Patient is aware when to return to the clinic for a follow-up visit. Patient educated on when it is appropriate to go to the emergency department.   Time call ended: 4:54 PM  I provided 11 minutes of non-face-to-face time during this encounter.  [name], MSN, APRN, FNP-C [facility] 03/10/21

## 2021-03-31 ENCOUNTER — Other Ambulatory Visit: Payer: Self-pay | Admitting: Family Medicine

## 2021-03-31 DIAGNOSIS — E119 Type 2 diabetes mellitus without complications: Secondary | ICD-10-CM

## 2021-04-21 ENCOUNTER — Ambulatory Visit (INDEPENDENT_AMBULATORY_CARE_PROVIDER_SITE_OTHER): Payer: Medicaid Other | Admitting: Family Medicine

## 2021-04-21 ENCOUNTER — Other Ambulatory Visit: Payer: Self-pay

## 2021-04-21 ENCOUNTER — Encounter: Payer: Self-pay | Admitting: Family Medicine

## 2021-04-21 VITALS — BP 137/84 | HR 98 | Temp 97.6°F | Ht 64.0 in | Wt 247.2 lb

## 2021-04-21 DIAGNOSIS — R058 Other specified cough: Secondary | ICD-10-CM | POA: Diagnosis not present

## 2021-04-21 DIAGNOSIS — F332 Major depressive disorder, recurrent severe without psychotic features: Secondary | ICD-10-CM

## 2021-04-21 DIAGNOSIS — F908 Attention-deficit hyperactivity disorder, other type: Secondary | ICD-10-CM

## 2021-04-21 MED ORDER — ATOMOXETINE HCL 40 MG PO CAPS
40.0000 mg | ORAL_CAPSULE | Freq: Every day | ORAL | 2 refills | Status: DC
Start: 2021-04-21 — End: 2021-05-19

## 2021-04-21 NOTE — Progress Notes (Signed)
Assessment & Plan:  1. Upper airway cough syndrome Encouraged to reschedule her appointment with ENT.  2. Severe episode of recurrent major depressive disorder, without psychotic features ([place]) Improving.  Continue Pristiq 25 mg once daily.  3. Attention deficit hyperactivity disorder (ADHD), other type Started patient on Strattera 40 mg once daily. - atomoxetine (STRATTERA) 40 MG capsule; Take 1 capsule (40 mg total) by mouth daily.  Dispense: 30 capsule; Refill: 2   Return in about 4 weeks (around 05/19/2021) for Strattera (telephone).  [name], MSN, APRN, FNP-C [facility]  Subjective:    Patient ID: Kimberly Green, female    DOB: 04/20/1961, 60 y.o.   MRN: [id number]  Patient Care Team: Loman Brooklyn, FNP as PCP - General (Family Medicine) Georga Bora, MD as Consulting Physician (Neurosurgery) Eloise Harman, DO as Consulting Physician (Internal Medicine) Particia Nearing, OD (Optometry)   Chief Complaint:  Chief Complaint  Patient presents with   Depression   Cough    2 month follow up      HPI: Kimberly Green is a 60 y.o. female presenting on 04/21/2021 for Depression and Cough (2 month follow up /)  Cough: She has a chronic cough that she states has been present since she was 69 months old when they "knicked her larynx". She reports she has been told that she has an esophageal spasm causing her cough. She is currently taking Gabapentin for the cough, but continues to cough frequently. She was previously referred to an ENT for further assessment.  She states she was unable to go to her first appointment as she did not have a babysitter for her grandchildren that she keeps.  She has not yet rescheduled the appointment.  Depression: Patient was previously going to a psychiatrist, but quit going.  At our last visit GeneSight testing was completed and she was started on Pristiq.  She states that she is much calmer and no  longer angry.  However she feels like she sits around more and is "a couch potato".  She is not keeping her house clean or doing other things that she feels like she needs to be doing.  She feels this would be corrected if she had her Adderall back.  The only other associated medication she is taking is Seroquel, which she reports she takes for sleep. Patient has failed therapy with Lithium, Clonazepam, Xanax, Zoloft, Celexa, Prozac, and Trazodone. She is not sure about Effexor and Lexapro.   Depression screen [facility] 2/9 04/21/2021 02/17/2021 09/14/2020  Decreased Interest 2 3 2   Down, Depressed, Hopeless 2 3 2   PHQ - 2 Score 4 6 4   Altered sleeping 2 3 2   Tired, decreased energy 2 3 2   Change in appetite 2 3 2   Feeling bad or failure about yourself  2 3 2   Trouble concentrating 2 3 2   Moving slowly or fidgety/restless 2 3 0  Suicidal thoughts 2 3 1   PHQ-9 Score 18 27 15   Difficult doing work/chores - Very difficult Not difficult at all  Some recent data might be hidden   Patient reports her thoughts are that she would be better off dead, not of harming herself.   GAD 7 : Generalized Anxiety Score 04/21/2021 02/17/2021 09/14/2020 07/20/2020  Nervous, Anxious, on Edge 1 3 2 3   Control/stop worrying 1 3 2 3   Worry too much - different things 1 3 2 3   Trouble relaxing 1 3 2 3   Restless 1 3  2 3  Easily annoyed or irritable $RemoveBefo'1 3 2 'TdpCNanpAEn$ -  Afraid - awful might happen $RemoveBefo'1 2 1 2  'ZNsiiwSKNsd$ Total GAD 7 Score $Remov'7 20 13 'OQIPDQ$ -  Anxiety Difficulty Extremely difficult Very difficult Somewhat difficult Very difficult    New complaints: None  Social history:  Relevant past medical, surgical, family and social history reviewed and updated as indicated. Interim medical history since our last visit reviewed.  Allergies and medications reviewed and updated.  DATA REVIEWED: CHART IN EPIC  ROS: Negative unless specifically indicated above in HPI.    Current Outpatient Medications:    albuterol (PROVENTIL) (2.5 MG/3ML) 0.083%  nebulizer solution, INHALE THE CONTENTS OF ONE VIAL (3ML) VIA NEBULIZER EVERY 6 HOURS AS NEEDED, Disp: 90 mL, Rfl: 2   albuterol (VENTOLIN HFA) 108 (90 Base) MCG/ACT inhaler, Inhale 1-2 puffs into the lungs every 6 (six) hours as needed for wheezing or shortness of breath., Disp: 1 each, Rfl: 3   atorvastatin (LIPITOR) 20 MG tablet, Take 1 tablet (20 mg total) by mouth daily., Disp: 90 tablet, Rfl: 1   cetirizine (ZYRTEC) 10 MG tablet, Take 1 tablet (10 mg total) by mouth daily., Disp: 90 tablet, Rfl: 0   dapagliflozin propanediol (FARXIGA) 10 MG TABS tablet, Take 1 tablet (10 mg total) by mouth daily., Disp: 90 tablet, Rfl: 1   Desvenlafaxine Succinate ER (PRISTIQ) 25 MG TB24, Take 25 mg by mouth daily., Disp: 30 tablet, Rfl: 2   gabapentin (NEURONTIN) 300 MG capsule, TAKE ONE CAPSULE BY MOUTH THREE TIMES DAILY, Disp: 90 capsule, Rfl: 0   glucose blood (ACCU-CHEK AVIVA PLUS) test strip, Use to check BG twice daily, Disp: 100 each, Rfl: 2   ipratropium-albuterol (DUONEB) 0.5-2.5 (3) MG/3ML SOLN, Take 3 mLs by nebulization every 6 (six) hours as needed., Disp: 360 mL, Rfl: 2   Lancets (ACCU-CHEK SOFT TOUCH) lancets, Use to check BG twice a day, Disp: 100 each, Rfl: 2   metFORMIN (GLUCOPHAGE) 1000 MG tablet, TAKE 1 TABLET BY MOUTH TWICE DAILY WITH A MEAL, Disp: 60 tablet, Rfl: 0   QUEtiapine (SEROQUEL) 100 MG tablet, Take 2 tablets (200 mg total) by mouth at bedtime., Disp: 180 tablet, Rfl: 1   salicylic acid-lactic acid 17 % external solution, Apply topically daily., Disp: 14 mL, Rfl: 0   Allergies  Allergen Reactions   Lexiscan [Regadenoson] Shortness Of Breath    Severe transient respiratory distress   Niacin Itching   Past Medical History:  Diagnosis Date   Adrenal tumor, benign    S/P L adrenal resection, 10/11 ([place])   Bipolar disorder ([place])    Cervical cancer ([place]) 1987   Cholelithiasis    COPD (chronic obstructive pulmonary disease) ([place])    Diabetes mellitus without complication  ([place])    Essential hypertension, benign    IBS (irritable bowel syndrome)    Mixed hyperlipidemia    PTSD (post-traumatic stress disorder)    Renal carcinoma (HCC)    S/P partial R nephrectomy (1/3 removed), 8/11 [facility])   Sleep apnea     Past Surgical History:  Procedure Laterality Date   ADRENAL GLAND SURGERY     BACK SURGERY     CERVICAL CONE BIOPSY     cervical cancer   CHOLECYSTECTOMY     KIDNEY MASS REMOVAL     NECK SURGERY     RIGHT WRIST SURGERY     SHOULDER SURGERY Left    TUBAL LIGATION      Social History   Socioeconomic History  Marital status: Divorced    Spouse name: Not on file   Number of children: 2   Years of education: 43   Highest education level: Not on file  Occupational History   Not on file  Tobacco Use   Smoking status: Every Day    Packs/day: 0.25    Years: 45.00    Pack years: 11.25    Types: Cigarettes   Smokeless tobacco: Never  Vaping Use   Vaping Use: Never used  Substance and Sexual Activity   Alcohol use: No   Drug use: No   Sexual activity: Not Currently  Other Topics Concern   Not on file  Social History Narrative   Drinks 1 cup of coffee a day    Social Determinants of Health   Financial Resource Strain: Not on file  Food Insecurity: Not on file  Transportation Needs: Not on file  Physical Activity: Not on file  Stress: Not on file  Social Connections: Not on file  Intimate Partner Violence: Not on file        Objective:    BP 137/84   Pulse 98   Temp 97.6 F (36.4 C) (Temporal)   Ht 5\' 4"  (1.626 m)   Wt 247 lb 3.2 oz (112.1 kg)   LMP 10/15/2015   SpO2 92%   BMI 42.43 kg/m   Wt Readings from Last 3 Encounters:  04/21/21 247 lb 3.2 oz (112.1 kg)  02/17/21 248 lb 12.8 oz (112.9 kg)  09/14/20 247 lb 12.8 oz (112.4 kg)    Physical Exam Vitals reviewed.  Constitutional:      General: She is not in acute distress.    Appearance: Normal appearance. She is morbidly obese. She is not ill-appearing,  toxic-appearing or diaphoretic.  HENT:     Head: Normocephalic and atraumatic.  Eyes:     General: No scleral icterus.       Right eye: No discharge.        Left eye: No discharge.     Conjunctiva/sclera: Conjunctivae normal.  Cardiovascular:     Rate and Rhythm: Normal rate and regular rhythm.     Heart sounds: Normal heart sounds. No murmur heard.   No friction rub. No gallop.  Pulmonary:     Effort: Pulmonary effort is normal. No respiratory distress.     Breath sounds: Normal breath sounds. No stridor. No wheezing, rhonchi or rales.  Musculoskeletal:        General: Normal range of motion.     Cervical back: Normal range of motion.  Skin:    General: Skin is warm and dry.     Capillary Refill: Capillary refill takes less than 2 seconds.  Neurological:     General: No focal deficit present.     Mental Status: She is alert and oriented to person, place, and time. Mental status is at baseline.  Psychiatric:        Mood and Affect: Mood normal.        Behavior: Behavior normal.        Thought Content: Thought content normal.        Judgment: Judgment normal.    Lab Results  Component Value Date   TSH 4.430 07/19/2017   Lab Results  Component Value Date   WBC 6.7 02/17/2021   HGB 15.2 02/17/2021   HCT 43.7 02/17/2021   MCV 86 02/17/2021   PLT 211 02/17/2021   Lab Results  Component Value Date   NA 141 02/17/2021  K 3.9 02/17/2021   CO2 23 02/17/2021   GLUCOSE 109 (H) 02/17/2021   BUN 9 02/17/2021   CREATININE 0.68 02/17/2021   BILITOT <0.2 02/17/2021   ALKPHOS 88 02/17/2021   AST 64 (H) 02/17/2021   ALT 73 (H) 02/17/2021   PROT 6.9 02/17/2021   ALBUMIN 4.3 02/17/2021   CALCIUM 10.1 02/17/2021   EGFR 101 02/17/2021   Lab Results  Component Value Date   CHOL 250 (H) 02/17/2021   Lab Results  Component Value Date   HDL 28 (L) 02/17/2021   Lab Results  Component Value Date   LDLCALC 129 (H) 02/17/2021   Lab Results  Component Value Date   TRIG 511  (H) 02/17/2021   Lab Results  Component Value Date   CHOLHDL 8.9 (H) 02/17/2021   Lab Results  Component Value Date   HGBA1C 6.5 02/17/2021

## 2021-04-22 ENCOUNTER — Encounter: Payer: Self-pay | Admitting: Family Medicine

## 2021-04-22 ENCOUNTER — Telehealth: Payer: Self-pay | Admitting: Family Medicine

## 2021-04-22 DIAGNOSIS — F332 Major depressive disorder, recurrent severe without psychotic features: Secondary | ICD-10-CM | POA: Insufficient documentation

## 2021-04-22 MED ORDER — FLUTICASONE PROPIONATE 50 MCG/ACT NA SUSP: 2.0000 | Freq: Every day | NASAL | 6 refills | Status: DC

## 2021-04-22 NOTE — Telephone Encounter (Signed)
Flonase prescription sent.

## 2021-04-22 NOTE — Telephone Encounter (Signed)
Not on medication list - can we send in

## 2021-04-29 ENCOUNTER — Other Ambulatory Visit: Payer: Self-pay | Admitting: Family Medicine

## 2021-04-29 DIAGNOSIS — F332 Major depressive disorder, recurrent severe without psychotic features: Secondary | ICD-10-CM

## 2021-05-04 ENCOUNTER — Other Ambulatory Visit: Payer: Self-pay | Admitting: Family Medicine

## 2021-05-04 ENCOUNTER — Ambulatory Visit: Payer: Medicaid Other | Admitting: Family Medicine

## 2021-05-05 ENCOUNTER — Telehealth: Payer: Self-pay | Admitting: Family Medicine

## 2021-05-05 NOTE — Telephone Encounter (Signed)
Pt aware she has to have visit with notes discussing need for supplies, she has an appt for 8/2

## 2021-05-06 ENCOUNTER — Encounter: Payer: Self-pay | Admitting: Family Medicine

## 2021-05-11 ENCOUNTER — Ambulatory Visit: Payer: Medicaid Other | Admitting: Family Medicine

## 2021-05-18 ENCOUNTER — Ambulatory Visit: Payer: Medicaid Other | Admitting: Family Medicine

## 2021-05-19 ENCOUNTER — Ambulatory Visit (INDEPENDENT_AMBULATORY_CARE_PROVIDER_SITE_OTHER): Payer: Medicaid Other | Admitting: Family Medicine

## 2021-05-19 DIAGNOSIS — N393 Stress incontinence (female) (male): Secondary | ICD-10-CM | POA: Insufficient documentation

## 2021-05-19 DIAGNOSIS — F908 Attention-deficit hyperactivity disorder, other type: Secondary | ICD-10-CM | POA: Diagnosis not present

## 2021-05-19 DIAGNOSIS — J449 Chronic obstructive pulmonary disease, unspecified: Secondary | ICD-10-CM

## 2021-05-19 MED ORDER — IPRATROPIUM-ALBUTEROL 0.5-2.5 (3) MG/3ML IN SOLN: 3.0000 mL | Freq: Four times a day (QID) | RESPIRATORY_TRACT | 2 refills | Status: AC | PRN

## 2021-05-19 MED ORDER — NEBULIZER/TUBING/MOUTHPIECE KIT: 1.0000 | PACK | 0 refills | Status: AC | PRN

## 2021-05-19 MED ORDER — NEBULIZER MASK ADULT MISC
1.0000 | 0 refills | Status: AC | PRN
Start: 2021-05-19 — End: ?

## 2021-05-19 NOTE — Progress Notes (Signed)
Virtual Visit via Telephone Note  I connected with Kimberly Green on 05/19/21 at 11:02 AM by telephone and verified that I am speaking with the correct person using two identifiers. Kimberly Green is currently located at home and nobody is currently with her during this visit. The provider, Loman Brooklyn, FNP is located in their office at time of visit.  I discussed the limitations, risks, security and privacy concerns of performing an evaluation and management service by telephone and the availability of in person appointments. I also discussed with the patient that there may be a patient responsible charge related to this service. The patient expressed understanding and agreed to proceed.  Subjective: PCP: Loman Brooklyn, FNP  Chief Complaint  Patient presents with   ADHD   Urinary Incontinence   Patient does not feel Strattera is helping with ADD.  She is in need of urinary incontinent supplies due to stress incontinence. States she urinates and stools when she coughs.   Patient is in need of a hose and mask for her nebulizer. Requesting this be sent to [facility] in [place].    ROS: Per HPI  Current Outpatient Medications:    albuterol (PROVENTIL) (2.5 MG/3ML) 0.083% nebulizer solution, INHALE THE CONTENTS OF ONE VIAL (3ML) VIA NEBULIZER EVERY 6 HOURS AS NEEDED, Disp: 90 mL, Rfl: 2   albuterol (VENTOLIN HFA) 108 (90 Base) MCG/ACT inhaler, Inhale 1-2 puffs into the lungs every 6 (six) hours as needed for wheezing or shortness of breath., Disp: 1 each, Rfl: 3   atomoxetine (STRATTERA) 40 MG capsule, Take 1 capsule (40 mg total) by mouth daily., Disp: 30 capsule, Rfl: 2   atorvastatin (LIPITOR) 20 MG tablet, Take 1 tablet (20 mg total) by mouth daily., Disp: 90 tablet, Rfl: 1   cetirizine (ZYRTEC) 10 MG tablet, Take 1 tablet (10 mg total) by mouth daily., Disp: 90 tablet, Rfl: 0   dapagliflozin propanediol (FARXIGA) 10 MG TABS tablet, Take 1 tablet (10 mg total)  by mouth daily., Disp: 90 tablet, Rfl: 1   Desvenlafaxine Succinate ER 25 MG TB24, take TAKE 1 TABLET BY MOUTH DAILY, Disp: 30 tablet, Rfl: 2   fluticasone (FLONASE) 50 MCG/ACT nasal spray, Place 2 sprays into both nostrils daily., Disp: 16 g, Rfl: 6   gabapentin (NEURONTIN) 300 MG capsule, TAKE ONE CAPSULE BY MOUTH THREE TIMES DAILY, Disp: 90 capsule, Rfl: 0   glucose blood (ACCU-CHEK AVIVA PLUS) test strip, Use to check BG twice daily, Disp: 100 each, Rfl: 2   ipratropium-albuterol (DUONEB) 0.5-2.5 (3) MG/3ML SOLN, Take 3 mLs by nebulization every 6 (six) hours as needed., Disp: 360 mL, Rfl: 2   Lancets (ACCU-CHEK SOFT TOUCH) lancets, Use to check BG twice a day, Disp: 100 each, Rfl: 2   metFORMIN (GLUCOPHAGE) 1000 MG tablet, TAKE 1 TABLET BY MOUTH TWICE DAILY WITH A MEAL, Disp: 60 tablet, Rfl: 0   QUEtiapine (SEROQUEL) 100 MG tablet, Take 2 tablets (200 mg total) by mouth at bedtime., Disp: 180 tablet, Rfl: 1   salicylic acid-lactic acid 17 % external solution, Apply topically daily., Disp: 14 mL, Rfl: 0  Allergies  Allergen Reactions   Lexiscan [Regadenoson] Shortness Of Breath    Severe transient respiratory distress   Niacin Itching   Past Medical History:  Diagnosis Date   Adrenal tumor, benign    S/P L adrenal resection, 10/11 ([place])   Bipolar disorder ([place])    Cervical cancer ([place]) 1987   Cholelithiasis    COPD (  chronic obstructive pulmonary disease) (HCC)    Diabetes mellitus without complication ([place])    Essential hypertension, benign    IBS (irritable bowel syndrome)    Mixed hyperlipidemia    PTSD (post-traumatic stress disorder)    Renal carcinoma (HCC)    S/P partial R nephrectomy (1/3 removed), 8/11 [facility])   Sleep apnea     Observations/Objective: A&O  No respiratory distress or wheezing audible over the phone Mood, judgement, and thought processes all WNL   Assessment and Plan: 1. Attention deficit hyperactivity disorder (ADHD), other type Uncontrolled.  Strattera D/C'd since it was not helping. Advised to follow-up with psychiatry.  2. Stress incontinence of urine Incontinence supplies ordered.  3. Chronic obstructive pulmonary disease, unspecified COPD type ([place]) - Respiratory Therapy Supplies (NEBULIZER MASK ADULT) MISC; 1 each by Does not apply route as needed.  Dispense: 1 each; Refill: 0 - Respiratory Therapy Supplies (NEBULIZER/TUBING/MOUTHPIECE) KIT; 1 each by Does not apply route as needed.  Dispense: 1 kit; Refill: 0   Follow Up Instructions: Return in about 3 months (around 08/19/2021) for annual physical.  I discussed the assessment and treatment plan with the patient. The patient was provided an opportunity to ask questions and all were answered. The patient agreed with the plan and demonstrated an understanding of the instructions.   The patient was advised to call back or seek an in-person evaluation if the symptoms worsen or if the condition fails to improve as anticipated.  The above assessment and management plan was discussed with the patient. The patient verbalized understanding of and has agreed to the management plan. Patient is aware to call the clinic if symptoms persist or worsen. Patient is aware when to return to the clinic for a follow-up visit. Patient educated on when it is appropriate to go to the emergency department.   Time call ended: 11:13 AM  I provided 11 minutes of non-face-to-face time during this encounter.  [name], MSN, APRN, FNP-C [facility] 05/19/21

## 2021-05-20 DIAGNOSIS — R32 Unspecified urinary incontinence: Secondary | ICD-10-CM | POA: Diagnosis not present

## 2021-05-20 DIAGNOSIS — J449 Chronic obstructive pulmonary disease, unspecified: Secondary | ICD-10-CM | POA: Diagnosis not present

## 2021-05-30 ENCOUNTER — Other Ambulatory Visit: Payer: Self-pay | Admitting: Family Medicine

## 2021-05-30 DIAGNOSIS — E785 Hyperlipidemia, unspecified: Secondary | ICD-10-CM

## 2021-06-10 DIAGNOSIS — R32 Unspecified urinary incontinence: Secondary | ICD-10-CM | POA: Diagnosis not present

## 2021-06-10 DIAGNOSIS — J449 Chronic obstructive pulmonary disease, unspecified: Secondary | ICD-10-CM | POA: Diagnosis not present

## 2021-06-13 ENCOUNTER — Other Ambulatory Visit: Payer: Self-pay | Admitting: Family Medicine

## 2021-07-06 ENCOUNTER — Ambulatory Visit (INDEPENDENT_AMBULATORY_CARE_PROVIDER_SITE_OTHER): Payer: Medicaid Other | Admitting: Family Medicine

## 2021-07-06 ENCOUNTER — Encounter: Payer: Self-pay | Admitting: Family Medicine

## 2021-07-06 DIAGNOSIS — J441 Chronic obstructive pulmonary disease with (acute) exacerbation: Secondary | ICD-10-CM

## 2021-07-06 MED ORDER — HYDROCOD POLST-CPM POLST ER 10-8 MG/5ML PO SUER
5.0000 mL | Freq: Two times a day (BID) | ORAL | 0 refills | Status: DC | PRN
Start: 2021-07-06 — End: 2021-12-16

## 2021-07-06 MED ORDER — PREDNISONE 10 MG (21) PO TBPK: ORAL_TABLET | ORAL | 0 refills | Status: DC

## 2021-07-06 MED ORDER — AMOXICILLIN-POT CLAVULANATE 875-125 MG PO TABS: 1.0000 | ORAL_TABLET | Freq: Two times a day (BID) | ORAL | 0 refills | Status: AC

## 2021-07-06 NOTE — Progress Notes (Signed)
   Virtual Visit  Note Due to COVID-19 pandemic this visit was conducted virtually. This visit type was conducted due to national recommendations for restrictions regarding the COVID-19 Pandemic (e.g. social distancing, sheltering in place) in an effort to limit this patient's exposure and mitigate transmission in our community. All issues noted in this document were discussed and addressed.  A physical exam was not performed with this format.  I connected with Loma Sender on 07/06/21 at [place] by telephone and verified that I am speaking with the correct person using two identifiers. Kimberly Green is currently located at home and her grandson is currently with her during the visit. The provider, Gwenlyn Perking, FNP is located in their office at time of visit.  I discussed the limitations, risks, security and privacy concerns of performing an evaluation and management service by telephone and the availability of in person appointments. I also discussed with the patient that there may be a patient responsible charge related to this service. The patient expressed understanding and agreed to proceed.  CC: cough  History and Present Illness:  HPI Kimberly Green reports a increased cough for about 10 days. She reports having a head cold that started at this time but the cough has gotten worse. Her cough is productive She has a history of COPD and asthma. She is also a smoker. She is also having increased shortness of breath. She is using her duoneb for this with improvement. She denies chest pain, fever, or chills.    ROS As per HPI.   Observations/Objective: Alert and oriented x 3. Able to speak in full sentences without difficulty. Coughing noted.    Assessment and Plan: [name] was seen today for cough.  Diagnoses and all orders for this visit:  COPD exacerbation ([place]) Discussed symptomatic care and return precautions.  -     amoxicillin-clavulanate (AUGMENTIN) 875-125 MG tablet;  Take 1 tablet by mouth 2 (two) times daily for 7 days. -     predniSONE (STERAPRED UNI-PAK 21 TAB) 10 MG (21) TBPK tablet; Use as directed on back of pill pack -     chlorpheniramine-HYDROcodone (TUSSIONEX) 10-8 MG/5ML SUER; Take 5 mLs by mouth every 12 (twelve) hours as needed for cough.    Follow Up Instructions: As needed.     I discussed the assessment and treatment plan with the patient. The patient was provided an opportunity to ask questions and all were answered. The patient agreed with the plan and demonstrated an understanding of the instructions.   The patient was advised to call back or seek an in-person evaluation if the symptoms worsen or if the condition fails to improve as anticipated.  The above assessment and management plan was discussed with the patient. The patient verbalized understanding of and has agreed to the management plan. Patient is aware to call the clinic if symptoms persist or worsen. Patient is aware when to return to the clinic for a follow-up visit. Patient educated on when it is appropriate to go to the emergency department.   Time call ended:  0812  I provided 11 minutes of  non face-to-face time during this encounter.    Gwenlyn Perking, FNP

## 2021-07-10 DIAGNOSIS — J449 Chronic obstructive pulmonary disease, unspecified: Secondary | ICD-10-CM | POA: Diagnosis not present

## 2021-07-10 DIAGNOSIS — R32 Unspecified urinary incontinence: Secondary | ICD-10-CM | POA: Diagnosis not present

## 2021-07-12 ENCOUNTER — Other Ambulatory Visit: Payer: Self-pay | Admitting: Family Medicine

## 2021-07-12 DIAGNOSIS — F908 Attention-deficit hyperactivity disorder, other type: Secondary | ICD-10-CM

## 2021-07-26 ENCOUNTER — Encounter: Payer: Medicaid Other | Admitting: Family Medicine

## 2021-07-27 ENCOUNTER — Encounter: Payer: Medicaid Other | Admitting: Family Medicine

## 2021-08-10 DIAGNOSIS — R32 Unspecified urinary incontinence: Secondary | ICD-10-CM | POA: Diagnosis not present

## 2021-08-10 DIAGNOSIS — J449 Chronic obstructive pulmonary disease, unspecified: Secondary | ICD-10-CM | POA: Diagnosis not present

## 2021-08-13 DIAGNOSIS — Z20822 Contact with and (suspected) exposure to covid-19: Secondary | ICD-10-CM | POA: Diagnosis not present

## 2021-08-13 DIAGNOSIS — M791 Myalgia, unspecified site: Secondary | ICD-10-CM | POA: Diagnosis not present

## 2021-08-13 DIAGNOSIS — J069 Acute upper respiratory infection, unspecified: Secondary | ICD-10-CM | POA: Diagnosis not present

## 2021-08-13 DIAGNOSIS — R070 Pain in throat: Secondary | ICD-10-CM | POA: Diagnosis not present

## 2021-08-17 ENCOUNTER — Other Ambulatory Visit: Payer: Self-pay | Admitting: Family Medicine

## 2021-08-20 ENCOUNTER — Encounter: Payer: Medicaid Other | Admitting: Family Medicine

## 2021-09-09 DIAGNOSIS — R32 Unspecified urinary incontinence: Secondary | ICD-10-CM | POA: Diagnosis not present

## 2021-09-09 DIAGNOSIS — J449 Chronic obstructive pulmonary disease, unspecified: Secondary | ICD-10-CM | POA: Diagnosis not present

## 2021-09-18 DIAGNOSIS — J441 Chronic obstructive pulmonary disease with (acute) exacerbation: Secondary | ICD-10-CM | POA: Diagnosis not present

## 2021-09-18 DIAGNOSIS — J208 Acute bronchitis due to other specified organisms: Secondary | ICD-10-CM | POA: Diagnosis not present

## 2021-09-18 DIAGNOSIS — J111 Influenza due to unidentified influenza virus with other respiratory manifestations: Secondary | ICD-10-CM | POA: Diagnosis not present

## 2021-09-18 DIAGNOSIS — R509 Fever, unspecified: Secondary | ICD-10-CM | POA: Diagnosis not present

## 2021-09-18 DIAGNOSIS — Z20822 Contact with and (suspected) exposure to covid-19: Secondary | ICD-10-CM | POA: Diagnosis not present

## 2021-09-19 ENCOUNTER — Other Ambulatory Visit: Payer: Self-pay | Admitting: Family Medicine

## 2021-10-27 ENCOUNTER — Other Ambulatory Visit: Payer: Self-pay | Admitting: Family Medicine

## 2021-10-27 DIAGNOSIS — E119 Type 2 diabetes mellitus without complications: Secondary | ICD-10-CM

## 2021-10-27 DIAGNOSIS — F332 Major depressive disorder, recurrent severe without psychotic features: Secondary | ICD-10-CM

## 2021-11-28 ENCOUNTER — Other Ambulatory Visit: Payer: Self-pay | Admitting: Family Medicine

## 2021-11-28 DIAGNOSIS — E119 Type 2 diabetes mellitus without complications: Secondary | ICD-10-CM

## 2021-12-10 ENCOUNTER — Telehealth: Payer: Self-pay | Admitting: Family Medicine

## 2021-12-10 NOTE — Telephone Encounter (Signed)
I agree with you that it should be in-person since her last visit was a televisit. ?

## 2021-12-10 NOTE — Telephone Encounter (Signed)
Patient called in to set up appt because she needs a refill on her medication. She has an appt made for 3/7 but wanted a message sent back to [facility] to ask if this could be a televisit. I made her aware that we prefer for her to come in for these visits since it is about a medication but she still wanted for Britney to be asked. Her last appt for a recheck was a televisit and it was on 05/19/2021. Please call back and advise.  ?

## 2021-12-13 ENCOUNTER — Other Ambulatory Visit: Payer: Self-pay | Admitting: Family Medicine

## 2021-12-13 DIAGNOSIS — E119 Type 2 diabetes mellitus without complications: Secondary | ICD-10-CM

## 2021-12-13 DIAGNOSIS — E785 Hyperlipidemia, unspecified: Secondary | ICD-10-CM

## 2021-12-13 NOTE — Telephone Encounter (Signed)
Pt informed and will have in person visit on 3/7 ?

## 2021-12-14 ENCOUNTER — Ambulatory Visit: Payer: Medicaid Other | Admitting: Family Medicine

## 2021-12-16 ENCOUNTER — Other Ambulatory Visit: Payer: Self-pay | Admitting: Family Medicine

## 2021-12-16 ENCOUNTER — Encounter: Payer: Self-pay | Admitting: Family Medicine

## 2021-12-16 ENCOUNTER — Ambulatory Visit (INDEPENDENT_AMBULATORY_CARE_PROVIDER_SITE_OTHER): Payer: Medicaid Other | Admitting: Family Medicine

## 2021-12-16 VITALS — BP 128/80 | HR 92 | Temp 97.6°F | Ht 64.0 in | Wt 231.2 lb

## 2021-12-16 DIAGNOSIS — F332 Major depressive disorder, recurrent severe without psychotic features: Secondary | ICD-10-CM

## 2021-12-16 DIAGNOSIS — E1169 Type 2 diabetes mellitus with other specified complication: Secondary | ICD-10-CM | POA: Diagnosis not present

## 2021-12-16 DIAGNOSIS — E1165 Type 2 diabetes mellitus with hyperglycemia: Secondary | ICD-10-CM

## 2021-12-16 DIAGNOSIS — I10 Essential (primary) hypertension: Secondary | ICD-10-CM

## 2021-12-16 DIAGNOSIS — E119 Type 2 diabetes mellitus without complications: Secondary | ICD-10-CM

## 2021-12-16 DIAGNOSIS — E785 Hyperlipidemia, unspecified: Secondary | ICD-10-CM

## 2021-12-16 DIAGNOSIS — Z1231 Encounter for screening mammogram for malignant neoplasm of breast: Secondary | ICD-10-CM

## 2021-12-16 DIAGNOSIS — F908 Attention-deficit hyperactivity disorder, other type: Secondary | ICD-10-CM

## 2021-12-16 LAB — BAYER DCA HB A1C WAIVED: HB A1C (BAYER DCA - WAIVED): 7.6 % — ABNORMAL HIGH (ref 4.8–5.6)

## 2021-12-16 LAB — HM DIABETES EYE EXAM

## 2021-12-16 MED ORDER — QUETIAPINE FUMARATE 100 MG PO TABS: 200.0000 mg | ORAL_TABLET | Freq: Every day | ORAL | 1 refills | Status: DC

## 2021-12-16 MED ORDER — ATORVASTATIN CALCIUM 20 MG PO TABS: 20.0000 mg | ORAL_TABLET | Freq: Every day | ORAL | 1 refills | Status: DC

## 2021-12-16 MED ORDER — METFORMIN HCL 1000 MG PO TABS: 1000.0000 mg | ORAL_TABLET | Freq: Two times a day (BID) | ORAL | 1 refills | Status: DC

## 2021-12-16 MED ORDER — VENLAFAXINE HCL ER 37.5 MG PO CP24: 37.5000 mg | ORAL_CAPSULE | Freq: Every day | ORAL | 2 refills | Status: DC

## 2021-12-16 MED ORDER — GABAPENTIN 300 MG PO CAPS: 300.0000 mg | ORAL_CAPSULE | Freq: Three times a day (TID) | ORAL | 5 refills | Status: DC

## 2021-12-16 MED ORDER — DAPAGLIFLOZIN PROPANEDIOL 10 MG PO TABS: 10.0000 mg | ORAL_TABLET | Freq: Every day | ORAL | 1 refills | Status: DC

## 2021-12-16 NOTE — Progress Notes (Signed)
Assessment & Plan:  1. Type 2 diabetes mellitus with hyperglycemia, without long-term current use of insulin (HCC) Lab Results  Component Value Date   HGBA1C 7.6 (H) 12/16/2021   HGBA1C 6.5 02/17/2021   HGBA1C 6.7 07/24/2020    - Diabetes is not at goal of A1c < 7. - Medications: continue current medications. Patient would like to bring her A1c back down with lifestyle changes. We did discuss if it is not under 7 in three months medications will have to be adjusted. - Home glucose monitoring: encouraged to monitor. - Patient is currently taking a statin. Patient is not taking an ACE-inhibitor/ARB.  - Instruction/counseling given: discussed diet  Diabetes Health Maintenance Due  Topic Date Due   OPHTHALMOLOGY EXAM  Never done   FOOT EXAM  02/17/2022   HEMOGLOBIN A1C  06/18/2022   URINE MICROALBUMIN  12/17/2022    Lab Results  Component Value Date   LABMICR 6.6 12/16/2021   LABMICR 11.2 07/20/2020   - Lipid panel - CBC with Differential/Platelet - CMP14+EGFR - Bayer DCA Hb A1c Waived - Vitamin B12 - Microalbumin / creatinine urine ratio - atorvastatin (LIPITOR) 20 MG tablet; Take 1 tablet (20 mg total) by mouth daily.  Dispense: 90 tablet; Refill: 1 - dapagliflozin propanediol (FARXIGA) 10 MG TABS tablet; Take 1 tablet (10 mg total) by mouth daily.  Dispense: 90 tablet; Refill: 1 - metFORMIN (GLUCOPHAGE) 1000 MG tablet; Take 1 tablet (1,000 mg total) by mouth 2 (two) times daily with a meal.  Dispense: 180 tablet; Refill: 1  2. Hyperlipidemia associated with type 2 diabetes mellitus ([place]) Well controlled on current regimen.  - Lipid panel - CMP14+EGFR - atorvastatin (LIPITOR) 20 MG tablet; Take 1 tablet (20 mg total) by mouth daily.  Dispense: 90 tablet; Refill: 1  3. Essential hypertension Well controlled on current regimen.  - Lipid panel - CBC with Differential/Platelet - CMP14+EGFR  4. Severe episode of recurrent major depressive disorder, without psychotic  features ([place]) Uncontrolled. Started Effexor. - QUEtiapine (SEROQUEL) 100 MG tablet; Take 2 tablets (200 mg total) by mouth at bedtime.  Dispense: 180 tablet; Refill: 1 - venlafaxine XR (EFFEXOR XR) 37.5 MG 24 hr capsule; Take 1 capsule (37.5 mg total) by mouth daily with breakfast.  Dispense: 30 capsule; Refill: 2  5. Attention deficit hyperactivity disorder (ADHD), other type - Ambulatory referral to Psychiatry   Return in about 6 weeks (around 01/27/2022) for Depression (telephone) and 3 months DM.  [name], MSN, APRN, FNP-C [facility]  Subjective:    Patient ID: Kimberly Green, female    DOB: [birth year], 61 y.o.   MRN: [id number]  Patient Care Team: Loman Brooklyn, FNP as PCP - General (Family Medicine) Georga Bora, MD as Consulting Physician (Neurosurgery) Eloise Harman, DO as Consulting Physician (Internal Medicine) Particia Nearing, OD (Optometry)   Chief Complaint:  Chief Complaint  Patient presents with   Medical Management of Chronic Issues   Depression    No better     HPI: Anabell Swint is a 61 y.o. female presenting on 12/16/2021 for Medical Management of Chronic Issues and Depression (No better )  Diabetes: Patient presents for follow up of diabetes. Current symptoms include: none. Known diabetic complications: none. Medication compliance: yes - Farxiga 10 mg QD and Metformin 1,000 mg BID. Current diet:  watches sugars and carbs; drinking lots of water; has a potato chip addiction . Current exercise: none. Home blood sugar records: patient does not check  sugars. Is she  on ACE inhibitor or angiotensin II receptor blocker? No. Is she on a statin? Yes (Atorvastatin). Patient has never had a diabetic eye exam. She does report they have been eating out a lot more recently due to issues at home and she has not been on her regular eating plan with fruits and vegetables.   COPD: patient has Albuterol that she uses 2-3 times  per month.  Depression: Patient was previously going to a psychiatrist, but quit going.  GeneSight testing was completed previously and she was started on Pristiq.  She previously stated that she was much calmer and no longer angry.  However she felt like she was sitting around more and was not keeping her house clean or doing other things that she feels like she needs to be doing, so she quit taking it.  She feels this would be corrected if she had her Adderall back. She is agreeable in trying Effexor at the recommendation of a family member that is a Software engineer. The only other associated medication she is taking is Seroquel, which she reports she takes for sleep. Patient has failed therapy with Lithium, Clonazepam, Xanax, Zoloft, Celexa, Prozac, and Trazodone. She is not sure about Effexor and Lexapro.   Depression screen [facility] 2/9 12/16/2021 04/21/2021 02/17/2021  Decreased Interest _0 Down, Depressed, Hopeless _1 PHQ - 2 Score _2 Altered sleeping _3 Tired, decreased energy _4 Change in appetite _5 Feeling bad or failure about yourself  _6 Trouble concentrating _7 Moving slowly or fidgety/restless _8 Suicidal thoughts _9 PHQ-9 Score _10 Difficult doing work/chores Somewhat difficult - Very difficult  Some recent data might be hidden   Patient reports her thoughts are that she would be better off dead, not of harming herself.   GAD 7 : Generalized Anxiety Score 12/16/2021 04/21/2021 02/17/2021 09/14/2020  Nervous, Anxious, on Edge _11 Control/stop worrying _12 Worry too much - different things _13 Trouble relaxing _14 Restless _15 Easily annoyed or irritable _16 Afraid - awful might happen _17 Total GAD 7 Score _18 Anxiety Difficulty Somewhat difficult Extremely difficult Very difficult Somewhat difficult    New complaints: None   Social history:  Relevant past medical, surgical, family and social  history reviewed and updated as indicated. Interim medical history since our last visit reviewed.  Allergies and medications reviewed and updated.  DATA REVIEWED: CHART IN EPIC  ROS: Negative unless specifically indicated above in HPI.    Current Outpatient Medications:    albuterol (PROVENTIL) (2.5 MG/3ML) 0.083% nebulizer solution, INHALE THE CONTENTS OF ONE VIAL (3ML) VIA NEBULIZER EVERY 6 HOURS AS NEEDED, Disp: 90 mL, Rfl: 2   albuterol (VENTOLIN HFA) 108 (90 Base) MCG/ACT inhaler, Inhale 1-2 puffs into the lungs every 6 (six) hours as needed for wheezing or shortness of breath., Disp: 1 each, Rfl: 3   atorvastatin (LIPITOR) 20 MG tablet, Take 1 tablet (20 mg total) by mouth daily. (NEEDS TO BE SEEN BEFORE NEXT REFILL), Disp: 30 tablet, Rfl: 0   cetirizine (ZYRTEC) 10 MG tablet, Take 1 tablet (10 mg total) by mouth daily., Disp: 90 tablet, Rfl: 0   dapagliflozin  propanediol (FARXIGA) 10 MG TABS tablet, Take 1 tablet (10 mg total) by mouth daily. (NEEDS TO BE SEEN BEFORE NEXT REFILL), Disp: 30 tablet, Rfl: 0   fluticasone (FLONASE) 50 MCG/ACT nasal spray, Place 2 sprays into both nostrils daily., Disp: 16 g, Rfl: 6   gabapentin (NEURONTIN) 300 MG capsule, Take 1 capsule (300 mg total) by mouth 3 (three) times daily. (NEEDS TO BE SEEN BEFORE NEXT REFILL), Disp: 90 capsule, Rfl: 0   glucose blood (ACCU-CHEK AVIVA PLUS) test strip, Use to check BG twice daily, Disp: 100 each, Rfl: 2   ipratropium-albuterol (DUONEB) 0.5-2.5 (3) MG/3ML SOLN, Take 3 mLs by nebulization every 6 (six) hours as needed., Disp: 360 mL, Rfl: 2   Lancets (ACCU-CHEK SOFT TOUCH) lancets, Use to check BG twice a day, Disp: 100 each, Rfl: 2   metFORMIN (GLUCOPHAGE) 1000 MG tablet, Take 1 tablet (1,000 mg total) by mouth 2 (two) times daily with a meal. (NEEDS TO BE SEEN BEFORE NEXT REFILL), Disp: 60 tablet, Rfl: 0   QUEtiapine (SEROQUEL) 100 MG tablet, Take 2 tablets (200 mg total) by mouth at bedtime., Disp: 180 tablet, Rfl:  1   Respiratory Therapy Supplies (NEBULIZER MASK ADULT) MISC, 1 each by Does not apply route as needed., Disp: 1 each, Rfl: 0   Respiratory Therapy Supplies (NEBULIZER/TUBING/MOUTHPIECE) KIT, 1 each by Does not apply route as needed., Disp: 1 kit, Rfl: 0   salicylic acid-lactic acid 17 % external solution, Apply topically daily., Disp: 14 mL, Rfl: 0   Desvenlafaxine Succinate ER 25 MG TB24, take TAKE 1 TABLET BY MOUTH DAILY (Patient not taking: Reported on 12/16/2021), Disp: 30 tablet, Rfl: 2   Allergies  Allergen Reactions   Lexiscan [Regadenoson] Shortness Of Breath    Severe transient respiratory distress   Niacin Itching   Past Medical History:  Diagnosis Date   Adrenal tumor, benign    S/P L adrenal resection, 10/11 ([place])   Bipolar disorder ([place])    Cervical cancer ([place]) 1987   Cholelithiasis    COPD (chronic obstructive pulmonary disease) ([place])    Diabetes mellitus without complication ([place])    Essential hypertension, benign    IBS (irritable bowel syndrome)    Mixed hyperlipidemia    PTSD (post-traumatic stress disorder)    Renal carcinoma (HCC)    S/P partial R nephrectomy (1/3 removed), 8/11 [facility])   Sleep apnea     Past Surgical History:  Procedure Laterality Date   ADRENAL GLAND SURGERY     BACK SURGERY     CERVICAL CONE BIOPSY     cervical cancer   CHOLECYSTECTOMY     KIDNEY MASS REMOVAL     NECK SURGERY     RIGHT WRIST SURGERY     SHOULDER SURGERY Left    TUBAL LIGATION      Social History   Socioeconomic History   Marital status: Divorced    Spouse name: Not on file   Number of children: 2   Years of education: 12   Highest education level: Not on file  Occupational History   Not on file  Tobacco Use   Smoking status: Every Day    Packs/day: 0.25    Years: 45.00    Pack years: 11.25    Types: Cigarettes   Smokeless tobacco: Never  Vaping Use   Vaping Use: Never used  Substance and Sexual Activity   Alcohol use: No   Drug use: No   Sexual  activity: Not Currently  Other Topics  Concern   Not on file  Social History Narrative   Drinks 1 cup of coffee a day    Social Determinants of Health   Financial Resource Strain: Not on file  Food Insecurity: Not on file  Transportation Needs: Not on file  Physical Activity: Not on file  Stress: Not on file  Social Connections: Not on file  Intimate Partner Violence: Not on file        Objective:    BP 128/80    Pulse 92    Temp 97.6 F (36.4 C) (Temporal)    Ht _0  (1.626 m)    Wt 231 lb 3.2 oz (104.9 kg)    LMP 10/15/2015    SpO2 94%    BMI 39.69 kg/m   Wt Readings from Last 3 Encounters:  12/16/21 231 lb 3.2 oz (104.9 kg)  04/21/21 247 lb 3.2 oz (112.1 kg)  02/17/21 248 lb 12.8 oz (112.9 kg)    Physical Exam Vitals reviewed.  Constitutional:      General: She is not in acute distress.    Appearance: Normal appearance. She is morbidly obese. She is not ill-appearing, toxic-appearing or diaphoretic.  HENT:     Head: Normocephalic and atraumatic.  Eyes:     General: No scleral icterus.       Right eye: No discharge.        Left eye: No discharge.     Conjunctiva/sclera: Conjunctivae normal.  Cardiovascular:     Rate and Rhythm: Normal rate and regular rhythm.     Heart sounds: Normal heart sounds. No murmur heard.   No friction rub. No gallop.  Pulmonary:     Effort: Pulmonary effort is normal. No respiratory distress.     Breath sounds: Normal breath sounds. No stridor. No wheezing, rhonchi or rales.  Musculoskeletal:        General: Normal range of motion.     Cervical back: Normal range of motion.  Skin:    General: Skin is warm and dry.     Capillary Refill: Capillary refill takes less than 2 seconds.  Neurological:     General: No focal deficit present.     Mental Status: She is alert and oriented to person, place, and time. Mental status is at baseline.  Psychiatric:        Mood and Affect: Mood normal.        Behavior: Behavior normal.         Thought Content: Thought content normal.        Judgment: Judgment normal.    Lab Results  Component Value Date   TSH 4.430 07/19/2017   Lab Results  Component Value Date   WBC 6.7 02/17/2021   HGB 15.2 02/17/2021   HCT 43.7 02/17/2021   MCV 86 02/17/2021   PLT 211 02/17/2021   Lab Results  Component Value Date   NA 141 02/17/2021   K 3.9 02/17/2021   CO2 23 02/17/2021   GLUCOSE 109 (H) 02/17/2021   BUN 9 02/17/2021   CREATININE 0.68 02/17/2021   BILITOT <0.2 02/17/2021   ALKPHOS 88 02/17/2021   AST 64 (H) 02/17/2021   ALT 73 (H) 02/17/2021   PROT 6.9 02/17/2021   ALBUMIN 4.3 02/17/2021   CALCIUM 10.1 02/17/2021   EGFR 101 02/17/2021   Lab Results  Component Value Date   CHOL 250 (H) 02/17/2021   Lab Results  Component Value Date   HDL 28 (L) 02/17/2021   Lab Results  Component Value Date   LDLCALC 129 (H) 02/17/2021   Lab Results  Component Value Date   TRIG 511 (H) 02/17/2021   Lab Results  Component Value Date   CHOLHDL 8.9 (H) 02/17/2021   Lab Results  Component Value Date   HGBA1C 6.5 02/17/2021

## 2021-12-17 ENCOUNTER — Encounter: Payer: Self-pay | Admitting: Family Medicine

## 2021-12-17 LAB — VITAMIN B12: Vitamin B-12: 407 pg/mL (ref 232–1245)

## 2021-12-17 LAB — CMP14+EGFR
ALT: 42 IU/L — ABNORMAL HIGH (ref 0–32)
AST: 48 IU/L — ABNORMAL HIGH (ref 0–40)
Albumin/Globulin Ratio: 1.7 (ref 1.2–2.2)
Albumin: 4 g/dL (ref 3.8–4.9)
Alkaline Phosphatase: 87 IU/L (ref 44–121)
BUN/Creatinine Ratio: 11 (ref 9–23)
BUN: 9 mg/dL (ref 6–24)
Bilirubin Total: <0.2 mg/dL (ref 0.0–1.2)
CO2: 22 mmol/L (ref 20–29)
Calcium: 9.6 mg/dL (ref 8.7–10.2)
Chloride: 105 mmol/L (ref 96–106)
Creatinine, Ser: 0.79 mg/dL (ref 0.57–1.00)
Globulin, Total: 2.4 g/dL (ref 1.5–4.5)
Glucose: 159 mg/dL — ABNORMAL HIGH (ref 70–99)
Potassium: 4.2 mmol/L (ref 3.5–5.2)
Sodium: 144 mmol/L (ref 134–144)
Total Protein: 6.4 g/dL (ref 6.0–8.5)
eGFR: 86 mL/min/{1.73_m2} (ref >59)

## 2021-12-17 LAB — LIPID PANEL
Chol/HDL Ratio: 5.3 ratio — ABNORMAL HIGH (ref 0.0–4.4)
Cholesterol, Total: 170 mg/dL (ref 100–199)
HDL: 32 mg/dL — ABNORMAL LOW (ref >39)
LDL Chol Calc (NIH): 72 mg/dL (ref 0–99)
Triglycerides: 421 mg/dL — ABNORMAL HIGH (ref 0–149)
VLDL Cholesterol Cal: 66 mg/dL — ABNORMAL HIGH (ref 5–40)

## 2021-12-17 LAB — CBC WITH DIFFERENTIAL/PLATELET
Basophils Absolute: 0.1 10*3/uL (ref 0.0–0.2)
Basos: 1 %
EOS (ABSOLUTE): 0.3 10*3/uL (ref 0.0–0.4)
Eos: 4 %
Hematocrit: 42.4 % (ref 34.0–46.6)
Hemoglobin: 14.5 g/dL (ref 11.1–15.9)
Immature Grans (Abs): 0 10*3/uL (ref 0.0–0.1)
Immature Granulocytes: 0 %
Lymphocytes Absolute: 2.5 10*3/uL (ref 0.7–3.1)
Lymphs: 32 %
MCH: 29.6 pg (ref 26.6–33.0)
MCHC: 34.2 g/dL (ref 31.5–35.7)
MCV: 87 fL (ref 79–97)
Monocytes Absolute: 0.4 10*3/uL (ref 0.1–0.9)
Monocytes: 6 %
Neutrophils Absolute: 4.3 10*3/uL (ref 1.4–7.0)
Neutrophils: 57 %
Platelets: 214 10*3/uL (ref 150–450)
RBC: 4.9 x10E6/uL (ref 3.77–5.28)
RDW: 12.1 % (ref 11.7–15.4)
WBC: 7.6 10*3/uL (ref 3.4–10.8)

## 2021-12-17 LAB — MICROALBUMIN / CREATININE URINE RATIO
Creatinine, Urine: 85.2 mg/dL
Microalb/Creat Ratio: 8 mg/g creat (ref 0–29)
Microalbumin, Urine: 6.6 ug/mL

## 2022-01-07 DIAGNOSIS — R32 Unspecified urinary incontinence: Secondary | ICD-10-CM | POA: Diagnosis not present

## 2022-01-07 DIAGNOSIS — J449 Chronic obstructive pulmonary disease, unspecified: Secondary | ICD-10-CM | POA: Diagnosis not present

## 2022-01-19 ENCOUNTER — Ambulatory Visit
Admission: RE | Admit: 2022-01-19 | Discharge: 2022-01-19 | Disposition: A | Discharge status: Home / Self Care | Payer: Medicaid Other | Source: Ambulatory Visit | Attending: Family Medicine | Admitting: Family Medicine

## 2022-01-19 DIAGNOSIS — Z1231 Encounter for screening mammogram for malignant neoplasm of breast: Secondary | ICD-10-CM

## 2022-01-20 DIAGNOSIS — F331 Major depressive disorder, recurrent, moderate: Secondary | ICD-10-CM | POA: Diagnosis not present

## 2022-01-20 DIAGNOSIS — F411 Generalized anxiety disorder: Secondary | ICD-10-CM | POA: Diagnosis not present

## 2022-01-20 DIAGNOSIS — F900 Attention-deficit hyperactivity disorder, predominantly inattentive type: Secondary | ICD-10-CM | POA: Diagnosis not present

## 2022-01-20 DIAGNOSIS — Z79899 Other long term (current) drug therapy: Secondary | ICD-10-CM | POA: Diagnosis not present

## 2022-01-24 DIAGNOSIS — Z20822 Contact with and (suspected) exposure to covid-19: Secondary | ICD-10-CM | POA: Diagnosis not present

## 2022-01-27 ENCOUNTER — Ambulatory Visit (INDEPENDENT_AMBULATORY_CARE_PROVIDER_SITE_OTHER): Payer: Medicaid Other | Admitting: Family Medicine

## 2022-01-27 ENCOUNTER — Encounter: Payer: Self-pay | Admitting: Family Medicine

## 2022-01-27 DIAGNOSIS — R454 Irritability and anger: Secondary | ICD-10-CM

## 2022-01-27 DIAGNOSIS — F332 Major depressive disorder, recurrent severe without psychotic features: Secondary | ICD-10-CM

## 2022-01-27 DIAGNOSIS — Z780 Asymptomatic menopausal state: Secondary | ICD-10-CM

## 2022-01-27 MED ORDER — VENLAFAXINE HCL ER 75 MG PO CP24: 75.0000 mg | ORAL_CAPSULE | Freq: Every day | ORAL | 2 refills | Status: DC

## 2022-01-27 NOTE — Progress Notes (Signed)
? ?Virtual Visit via Telephone Note ? ?I connected with Kimberly Green on 01/27/22 at 9:47 AM by telephone and verified that I am speaking with the correct person using two identifiers. Kimberly Green is currently located at home and nobody is currently with her during this visit. The provider, Loman Brooklyn, FNP is located in their office at time of visit. ? ?I discussed the limitations, risks, security and privacy concerns of performing an evaluation and management service by telephone and the availability of in person appointments. I also discussed with the patient that there may be a patient responsible charge related to this service. The patient expressed understanding and agreed to proceed. ? ?Subjective: ?PCP: Loman Brooklyn, FNP ? ?Chief Complaint  ?Patient presents with  ? Depression  ? ?Patient is following up on depression. She was started on Effexor 37.5 mg at our last visit six weeks ago. She reports today she felt a little improvement at the beginning, but then felt like it wasn't doing much. She has been able to get re-established with psychiatry. States they gave her something similar to Adderal but she hasn't had the money to pick it up yet.  ? ? ?  01/27/2022  ?  9:48 AM 12/16/2021  ?  3:18 PM 04/21/2021  ?  4:11 PM  ?Depression screen PHQ 2/9  ?Decreased Interest 3 3 2   ?Down, Depressed, Hopeless 3 3 2   ?PHQ - 2 Score 6 6 4   ?Altered sleeping 3 3 2   ?Tired, decreased energy 3 3 2   ?Change in appetite 3 3 2   ?Feeling bad or failure about yourself  3 3 2   ?Trouble concentrating 3 3 2   ?Moving slowly or fidgety/restless 3 3 2   ?Suicidal thoughts 1 3 2   ?PHQ-9 Score 25 27 18   ?Difficult doing work/chores Very difficult Somewhat difficult   ? ? ?  01/27/2022  ?  9:49 AM 12/16/2021  ?  3:18 PM 04/21/2021  ?  4:11 PM 02/17/2021  ?  4:52 PM  ?GAD 7 : Generalized Anxiety Score  ?Nervous, Anxious, on Edge 3 3 1 3   ?Control/stop worrying 3 2 1 3   ?Worry too much - different things 3 3 1 3   ?Trouble  relaxing 3 3 1 3   ?Restless 3 3 1 3   ?Easily annoyed or irritable 3 3 1 3   ?Afraid - awful might happen 2 2 1 2   ?Total GAD 7 Score 20 19 7 20   ?Anxiety Difficulty Somewhat difficult Somewhat difficult Extremely difficult Very difficult  ? ? ?New Complaints: ?Patient would like hormone lab work due to her irritability.  ? ? ?ROS: Per HPI ? ?Current Outpatient Medications:  ?  albuterol (PROVENTIL) (2.5 MG/3ML) 0.083% nebulizer solution, INHALE THE CONTENTS OF ONE VIAL (3ML) VIA NEBULIZER EVERY 6 HOURS AS NEEDED, Disp: 90 mL, Rfl: 2 ?  albuterol (VENTOLIN HFA) 108 (90 Base) MCG/ACT inhaler, Inhale 1-2 puffs into the lungs every 6 (six) hours as needed for wheezing or shortness of breath., Disp: 1 each, Rfl: 3 ?  atorvastatin (LIPITOR) 20 MG tablet, Take 1 tablet (20 mg total) by mouth daily., Disp: 90 tablet, Rfl: 1 ?  cetirizine (ZYRTEC) 10 MG tablet, Take 1 tablet (10 mg total) by mouth daily., Disp: 90 tablet, Rfl: 0 ?  dapagliflozin propanediol (FARXIGA) 10 MG TABS tablet, Take 1 tablet (10 mg total) by mouth daily., Disp: 90 tablet, Rfl: 1 ?  fluticasone (FLONASE) 50 MCG/ACT nasal spray, Place 2 sprays into both nostrils  daily., Disp: 16 g, Rfl: 6 ?  gabapentin (NEURONTIN) 300 MG capsule, Take 1 capsule (300 mg total) by mouth 3 (three) times daily., Disp: 90 capsule, Rfl: 5 ?  glucose blood (ACCU-CHEK AVIVA PLUS) test strip, Use to check BG twice daily, Disp: 100 each, Rfl: 2 ?  ipratropium-albuterol (DUONEB) 0.5-2.5 (3) MG/3ML SOLN, Take 3 mLs by nebulization every 6 (six) hours as needed., Disp: 360 mL, Rfl: 2 ?  Lancets (ACCU-CHEK SOFT TOUCH) lancets, Use to check BG twice a day, Disp: 100 each, Rfl: 2 ?  metFORMIN (GLUCOPHAGE) 1000 MG tablet, Take 1 tablet (1,000 mg total) by mouth 2 (two) times daily with a meal., Disp: 180 tablet, Rfl: 1 ?  QUEtiapine (SEROQUEL) 100 MG tablet, Take 2 tablets (200 mg total) by mouth at bedtime., Disp: 180 tablet, Rfl: 1 ?  Respiratory Therapy Supplies (NEBULIZER MASK  ADULT) MISC, 1 each by Does not apply route as needed., Disp: 1 each, Rfl: 0 ?  Respiratory Therapy Supplies (NEBULIZER/TUBING/MOUTHPIECE) KIT, 1 each by Does not apply route as needed., Disp: 1 kit, Rfl: 0 ?  salicylic acid-lactic acid 17 % external solution, Apply topically daily., Disp: 14 mL, Rfl: 0 ?  venlafaxine XR (EFFEXOR XR) 37.5 MG 24 hr capsule, Take 1 capsule (37.5 mg total) by mouth daily with breakfast., Disp: 30 capsule, Rfl: 2 ? ?Allergies  ?Allergen Reactions  ? Lexiscan [Regadenoson] Shortness Of Breath  ?  Severe transient respiratory distress  ? Niacin Itching  ? ?Past Medical History:  ?Diagnosis Date  ? Adrenal tumor, benign   ? S/P L adrenal resection, 10/11 [facility])  ? Bipolar disorder ([place])   ? Cervical cancer ([place]) 1987  ? Cholelithiasis   ? COPD (chronic obstructive pulmonary disease) ([place])   ? Diabetes mellitus without complication ([place])   ? Essential hypertension, benign   ? IBS (irritable bowel syndrome)   ? Mixed hyperlipidemia   ? PTSD (post-traumatic stress disorder)   ? Renal carcinoma ([place])   ? S/P partial R nephrectomy (1/3 removed), 8/11 [facility])  ? Sleep apnea   ? ? ?Observations/Objective: ?A&O  ?No respiratory distress or wheezing audible over the phone ?Mood, judgement, and thought processes all WNL ? ?Assessment and Plan: ?1. Severe episode of recurrent major depressive disorder, without psychotic features ([place]) ?Uncontrolled. Increasing Effexor from 37.5 mg to 75 mg daily. Continue following with psychiatry. ?- venlafaxine XR (EFFEXOR XR) 75 MG 24 hr capsule; Take 1 capsule (75 mg total) by mouth daily with breakfast.  Dispense: 30 capsule; Refill: 2 ? ?2. Irritability ?- Hormone Panel; Future ? ?3. Post-menopausal ?- Hormone Panel; Future ? ? ?Follow Up Instructions: ?Return as scheduled. ? ?I discussed the assessment and treatment plan with the patient. The patient was provided an opportunity to ask questions and all were answered. The patient agreed with the plan and  demonstrated an understanding of the instructions. ?  ?The patient was advised to call back or seek an in-person evaluation if the symptoms worsen or if the condition fails to improve as anticipated. ? ?The above assessment and management plan was discussed with the patient. The patient verbalized understanding of and has agreed to the management plan. Patient is aware to call the clinic if symptoms persist or worsen. Patient is aware when to return to the clinic for a follow-up visit. Patient educated on when it is appropriate to go to the emergency department.  ? ?Time call ended: 9:58 AM ? ?I provided 11 minutes of non-face-to-face time during this  encounter. ? ?[name], MSN, APRN, FNP-C ?[place] ?01/27/22 ? ? ?

## 2022-02-17 DIAGNOSIS — Z79899 Other long term (current) drug therapy: Secondary | ICD-10-CM | POA: Diagnosis not present

## 2022-02-17 DIAGNOSIS — F331 Major depressive disorder, recurrent, moderate: Secondary | ICD-10-CM | POA: Diagnosis not present

## 2022-02-17 DIAGNOSIS — F411 Generalized anxiety disorder: Secondary | ICD-10-CM | POA: Diagnosis not present

## 2022-02-17 DIAGNOSIS — F900 Attention-deficit hyperactivity disorder, predominantly inattentive type: Secondary | ICD-10-CM | POA: Diagnosis not present

## 2022-02-22 ENCOUNTER — Telehealth: Payer: Self-pay | Admitting: Family Medicine

## 2022-02-22 DIAGNOSIS — J40 Bronchitis, not specified as acute or chronic: Secondary | ICD-10-CM | POA: Diagnosis not present

## 2022-02-22 DIAGNOSIS — J101 Influenza due to other identified influenza virus with other respiratory manifestations: Secondary | ICD-10-CM | POA: Diagnosis not present

## 2022-02-22 DIAGNOSIS — M791 Myalgia, unspecified site: Secondary | ICD-10-CM | POA: Diagnosis not present

## 2022-02-22 DIAGNOSIS — Z20822 Contact with and (suspected) exposure to covid-19: Secondary | ICD-10-CM | POA: Diagnosis not present

## 2022-02-22 NOTE — Telephone Encounter (Signed)
Patient aware and verbalizes understanding. 

## 2022-02-22 NOTE — Telephone Encounter (Signed)
I cannot see where she went to urgent care.  Encourage her to take Delsym for her cough. ?

## 2022-02-22 NOTE — Telephone Encounter (Signed)
Pt states that she has been sick since last Friday. Her cough is constant and productive. Denies fever today. Pt is waiting on pharmacy to deliver ATB and prednisone from seeing urgent care today. States that Karsten Fells is aware of her laryngeal spasms and hernia. States that the hernia has enlarged this past week. Pt would like a prescription for Tussinex. ? ?[place] for phone visit if needed. ?

## 2022-02-24 ENCOUNTER — Other Ambulatory Visit: Payer: Self-pay | Admitting: Family Medicine

## 2022-02-24 DIAGNOSIS — F332 Major depressive disorder, recurrent severe without psychotic features: Secondary | ICD-10-CM

## 2022-03-17 DIAGNOSIS — F900 Attention-deficit hyperactivity disorder, predominantly inattentive type: Secondary | ICD-10-CM | POA: Diagnosis not present

## 2022-03-17 DIAGNOSIS — F411 Generalized anxiety disorder: Secondary | ICD-10-CM | POA: Diagnosis not present

## 2022-03-17 DIAGNOSIS — F331 Major depressive disorder, recurrent, moderate: Secondary | ICD-10-CM | POA: Diagnosis not present

## 2022-03-23 ENCOUNTER — Other Ambulatory Visit: Payer: Self-pay | Admitting: Nurse Practitioner

## 2022-03-23 DIAGNOSIS — J441 Chronic obstructive pulmonary disease with (acute) exacerbation: Secondary | ICD-10-CM

## 2022-03-29 ENCOUNTER — Encounter: Payer: Self-pay | Admitting: Family Medicine

## 2022-03-29 ENCOUNTER — Encounter: Payer: Medicaid Other | Admitting: Family Medicine

## 2022-03-29 NOTE — Progress Notes (Signed)
Patient did not answer the phone. VM left. No call back. Closing encounter.

## 2022-03-30 ENCOUNTER — Encounter: Payer: Self-pay | Admitting: Family Medicine

## 2022-03-30 ENCOUNTER — Telehealth: Payer: Self-pay | Admitting: Family Medicine

## 2022-03-30 ENCOUNTER — Ambulatory Visit (INDEPENDENT_AMBULATORY_CARE_PROVIDER_SITE_OTHER): Payer: Medicaid Other | Admitting: Family Medicine

## 2022-03-30 DIAGNOSIS — J441 Chronic obstructive pulmonary disease with (acute) exacerbation: Secondary | ICD-10-CM | POA: Diagnosis not present

## 2022-03-30 DIAGNOSIS — J449 Chronic obstructive pulmonary disease, unspecified: Secondary | ICD-10-CM

## 2022-03-30 MED ORDER — METHYLPREDNISOLONE 4 MG PO TBPK: ORAL_TABLET | ORAL | 0 refills | Status: DC

## 2022-03-30 MED ORDER — AMOXICILLIN-POT CLAVULANATE 875-125 MG PO TABS: 1.0000 | ORAL_TABLET | Freq: Two times a day (BID) | ORAL | 0 refills | Status: AC

## 2022-03-30 NOTE — Progress Notes (Signed)
Virtual Visit via Telephone Note  I connected with Kimberly Green on 03/30/22 at 9:17 AM by telephone and verified that I am speaking with the correct person using two identifiers. Kimberly Green is currently located at home and nobody is currently with her during this visit. The provider, Loman Brooklyn, FNP is located in their office at time of visit.  I discussed the limitations, risks, security and privacy concerns of performing an evaluation and management service by telephone and the availability of in person appointments. I also discussed with the patient that there may be a patient responsible charge related to this service. The patient expressed understanding and agreed to proceed.  Subjective: PCP: Loman Brooklyn, FNP  Chief Complaint  Patient presents with   Cough   Patient complains of cough, chest congestion, sore throat, shortness of breath, and wheezing. Onset of symptoms was 3 days ago, gradually worsening since that time. She is drinking plenty of fluids. Evaluation to date: none. Treatment to date: cough suppressants. She has a history of COPD. She does smoke.    ROS: Per HPI  Current Outpatient Medications:    albuterol (PROVENTIL) (2.5 MG/3ML) 0.083% nebulizer solution, inhale THE contents of one vial via nebulizer EVERY 6 HOURS AS NEEDED, Disp: 90 mL, Rfl: 0   albuterol (VENTOLIN HFA) 108 (90 Base) MCG/ACT inhaler, Inhale 1-2 puffs into the lungs every 6 (six) hours as needed for wheezing or shortness of breath., Disp: 1 each, Rfl: 3   atorvastatin (LIPITOR) 20 MG tablet, Take 1 tablet (20 mg total) by mouth daily., Disp: 90 tablet, Rfl: 1   cetirizine (ZYRTEC) 10 MG tablet, Take 1 tablet (10 mg total) by mouth daily., Disp: 90 tablet, Rfl: 0   dapagliflozin propanediol (FARXIGA) 10 MG TABS tablet, Take 1 tablet (10 mg total) by mouth daily., Disp: 90 tablet, Rfl: 1   fluticasone (FLONASE) 50 MCG/ACT nasal spray, Place 2 sprays into both nostrils  daily., Disp: 16 g, Rfl: 6   gabapentin (NEURONTIN) 300 MG capsule, Take 1 capsule (300 mg total) by mouth 3 (three) times daily., Disp: 90 capsule, Rfl: 5   glucose blood (ACCU-CHEK AVIVA PLUS) test strip, Use to check BG twice daily, Disp: 100 each, Rfl: 2   ipratropium-albuterol (DUONEB) 0.5-2.5 (3) MG/3ML SOLN, Take 3 mLs by nebulization every 6 (six) hours as needed., Disp: 360 mL, Rfl: 2   Lancets (ACCU-CHEK SOFT TOUCH) lancets, Use to check BG twice a day, Disp: 100 each, Rfl: 2   metFORMIN (GLUCOPHAGE) 1000 MG tablet, Take 1 tablet (1,000 mg total) by mouth 2 (two) times daily with a meal., Disp: 180 tablet, Rfl: 1   QUEtiapine (SEROQUEL) 100 MG tablet, Take 2 tablets (200 mg total) by mouth at bedtime., Disp: 180 tablet, Rfl: 1   Respiratory Therapy Supplies (NEBULIZER MASK ADULT) MISC, 1 each by Does not apply route as needed., Disp: 1 each, Rfl: 0   Respiratory Therapy Supplies (NEBULIZER/TUBING/MOUTHPIECE) KIT, 1 each by Does not apply route as needed., Disp: 1 kit, Rfl: 0   salicylic acid-lactic acid 17 % external solution, Apply topically daily., Disp: 14 mL, Rfl: 0   venlafaxine XR (EFFEXOR XR) 75 MG 24 hr capsule, Take 1 capsule (75 mg total) by mouth daily with breakfast., Disp: 30 capsule, Rfl: 2   venlafaxine XR (EFFEXOR-XR) 37.5 MG 24 hr capsule, TAKE 1 CAPSULE BY MOUTH DAILY WITH BREAKFAST, Disp: 30 capsule, Rfl: 2  Allergies  Allergen Reactions   Lexiscan [Regadenoson] Shortness Of  Breath    Severe transient respiratory distress   Niacin Itching   Past Medical History:  Diagnosis Date   Adrenal tumor, benign    S/P L adrenal resection, 10/11 ([place])   Bipolar disorder ([place])    Cervical cancer ([place]) 1987   Cholelithiasis    COPD (chronic obstructive pulmonary disease) ([place])    Diabetes mellitus without complication ([place])    Essential hypertension, benign    IBS (irritable bowel syndrome)    Mixed hyperlipidemia    PTSD (post-traumatic stress disorder)    Renal  carcinoma (HCC)    S/P partial R nephrectomy (1/3 removed), 8/11 [facility])   Sleep apnea     Observations/Objective: A&O  No respiratory distress or wheezing audible over the phone Mood, judgement, and thought processes all WNL  Assessment and Plan: 1. COPD exacerbation [facility]) Education provided on COPD exacerbations. - amoxicillin-clavulanate (AUGMENTIN) 875-125 MG tablet; Take 1 tablet by mouth 2 (two) times daily for 7 days.  Dispense: 14 tablet; Refill: 0 - methylPREDNISolone (MEDROL DOSEPAK) 4 MG TBPK tablet; Use as directed.  Dispense: 21 each; Refill: 0   Follow Up Instructions:  I discussed the assessment and treatment plan with the patient. The patient was provided an opportunity to ask questions and all were answered. The patient agreed with the plan and demonstrated an understanding of the instructions.   The patient was advised to call back or seek an in-person evaluation if the symptoms worsen or if the condition fails to improve as anticipated.  The above assessment and management plan was discussed with the patient. The patient verbalized understanding of and has agreed to the management plan. Patient is aware to call the clinic if symptoms persist or worsen. Patient is aware when to return to the clinic for a follow-up visit. Patient educated on when it is appropriate to go to the emergency department.   Time call ended: 9:28 PM  I provided 11 minutes of non-face-to-face time during this encounter.  [name], MSN, APRN, FNP-C [facility] 03/30/22

## 2022-03-30 NOTE — Telephone Encounter (Signed)
Nebulizer ordered and printed to be faxed over.

## 2022-03-30 NOTE — Telephone Encounter (Signed)
Please advise, patient was seen today

## 2022-04-05 ENCOUNTER — Telehealth: Payer: Self-pay

## 2022-04-05 NOTE — Telephone Encounter (Signed)
Farxiga 10MG  tablets   Key: [id number] - PA Case ID: [id number] - Rx #: [id number]   Sent to plan

## 2022-04-06 NOTE — Telephone Encounter (Signed)
Kimberly Green (Key: [id number]) Rx #: [id number] Ozempic (0.25 or 0.5 MG/DOSE) '2MG'$ /3ML pen-injectors   Form [employer] Electronic Prior Authorization Form (2017 NCPDP) Created 2 days ago Sent to Plan 5 hours ago Plan Response 5 hours ago Submit Clinical Questions 5 hours ago Determination Favorable 5 hours ago Message from Plan Request Reference Number: [id number]. OZEMPIC INJ '2MG'$ /3ML is approved through 04/07/2023. Your patient may now fill this prescription and it will be covered.  Pharmacy aware

## 2022-04-07 ENCOUNTER — Ambulatory Visit: Payer: Medicaid Other | Admitting: Family Medicine

## 2022-04-19 ENCOUNTER — Telehealth: Payer: Self-pay | Admitting: Family Medicine

## 2022-04-19 NOTE — Telephone Encounter (Signed)
..   Medicaid Managed Care   Unsuccessful Outreach Note  04/19/2022 Name: Kimberly Green MRN: [id number] DOB: [birth year]  Referred by: Loman Brooklyn, FNP Reason for referral : High Risk Managed Medicaid (I called the patient today to get her scheduled with the MM Team. Her number would ring a couple of times and then go to a fast busy signal.)   An unsuccessful telephone outreach was attempted today. The patient was referred to the case management team for assistance with care management and care coordination.   Follow Up Plan: The care management team will reach out to the patient again over the next 7-14 days.     [place]

## 2022-04-20 DIAGNOSIS — R32 Unspecified urinary incontinence: Secondary | ICD-10-CM | POA: Diagnosis not present

## 2022-04-20 DIAGNOSIS — J449 Chronic obstructive pulmonary disease, unspecified: Secondary | ICD-10-CM | POA: Diagnosis not present

## 2022-04-24 ENCOUNTER — Other Ambulatory Visit: Payer: Self-pay | Admitting: Family Medicine

## 2022-04-24 DIAGNOSIS — F332 Major depressive disorder, recurrent severe without psychotic features: Secondary | ICD-10-CM

## 2022-05-03 ENCOUNTER — Ambulatory Visit: Payer: Medicaid Other | Admitting: Family Medicine

## 2022-05-04 ENCOUNTER — Ambulatory Visit: Payer: Medicaid Other | Admitting: Family Medicine

## 2022-05-27 ENCOUNTER — Ambulatory Visit: Payer: Medicaid Other | Admitting: Nurse Practitioner

## 2022-06-02 ENCOUNTER — Other Ambulatory Visit: Payer: Self-pay | Admitting: Family Medicine

## 2022-06-02 DIAGNOSIS — F332 Major depressive disorder, recurrent severe without psychotic features: Secondary | ICD-10-CM

## 2022-06-06 ENCOUNTER — Ambulatory Visit: Payer: Medicaid Other | Admitting: Nurse Practitioner

## 2022-06-07 ENCOUNTER — Encounter: Payer: Self-pay | Admitting: Nurse Practitioner

## 2022-06-09 DIAGNOSIS — F331 Major depressive disorder, recurrent, moderate: Secondary | ICD-10-CM | POA: Diagnosis not present

## 2022-06-09 DIAGNOSIS — F900 Attention-deficit hyperactivity disorder, predominantly inattentive type: Secondary | ICD-10-CM | POA: Diagnosis not present

## 2022-06-09 DIAGNOSIS — F411 Generalized anxiety disorder: Secondary | ICD-10-CM | POA: Diagnosis not present

## 2022-06-21 DIAGNOSIS — J449 Chronic obstructive pulmonary disease, unspecified: Secondary | ICD-10-CM | POA: Diagnosis not present

## 2022-06-21 DIAGNOSIS — R32 Unspecified urinary incontinence: Secondary | ICD-10-CM | POA: Diagnosis not present

## 2022-06-25 DIAGNOSIS — J069 Acute upper respiratory infection, unspecified: Secondary | ICD-10-CM | POA: Diagnosis not present

## 2022-06-25 DIAGNOSIS — Z20822 Contact with and (suspected) exposure to covid-19: Secondary | ICD-10-CM | POA: Diagnosis not present

## 2022-07-06 ENCOUNTER — Other Ambulatory Visit: Payer: Self-pay | Admitting: Family Medicine

## 2022-07-06 DIAGNOSIS — J40 Bronchitis, not specified as acute or chronic: Secondary | ICD-10-CM | POA: Diagnosis not present

## 2022-07-06 DIAGNOSIS — E1169 Type 2 diabetes mellitus with other specified complication: Secondary | ICD-10-CM

## 2022-07-06 DIAGNOSIS — J449 Chronic obstructive pulmonary disease, unspecified: Secondary | ICD-10-CM | POA: Diagnosis not present

## 2022-07-06 DIAGNOSIS — E1165 Type 2 diabetes mellitus with hyperglycemia: Secondary | ICD-10-CM

## 2022-07-08 DIAGNOSIS — Z20822 Contact with and (suspected) exposure to covid-19: Secondary | ICD-10-CM | POA: Diagnosis not present

## 2022-07-08 DIAGNOSIS — R059 Cough, unspecified: Secondary | ICD-10-CM | POA: Diagnosis not present

## 2022-07-08 DIAGNOSIS — R0602 Shortness of breath: Secondary | ICD-10-CM | POA: Diagnosis not present

## 2022-07-08 DIAGNOSIS — Z720 Tobacco use: Secondary | ICD-10-CM | POA: Diagnosis not present

## 2022-07-08 DIAGNOSIS — J441 Chronic obstructive pulmonary disease with (acute) exacerbation: Secondary | ICD-10-CM | POA: Diagnosis not present

## 2022-08-03 ENCOUNTER — Telehealth: Payer: Self-pay | Admitting: Nurse Practitioner

## 2022-08-04 ENCOUNTER — Other Ambulatory Visit: Payer: Self-pay | Admitting: Family Medicine

## 2022-08-04 DIAGNOSIS — E1165 Type 2 diabetes mellitus with hyperglycemia: Secondary | ICD-10-CM

## 2022-08-04 NOTE — Telephone Encounter (Signed)
Paperwork was received, faxed back w/ note pt NTBS last OV w/ notes pertaining to incontinence supplies was 05/19/21

## 2022-08-15 ENCOUNTER — Other Ambulatory Visit: Payer: Self-pay | Admitting: Nurse Practitioner

## 2022-08-15 DIAGNOSIS — E1169 Type 2 diabetes mellitus with other specified complication: Secondary | ICD-10-CM

## 2022-08-15 DIAGNOSIS — E1165 Type 2 diabetes mellitus with hyperglycemia: Secondary | ICD-10-CM

## 2022-08-30 DIAGNOSIS — F331 Major depressive disorder, recurrent, moderate: Secondary | ICD-10-CM | POA: Diagnosis not present

## 2022-08-30 DIAGNOSIS — F900 Attention-deficit hyperactivity disorder, predominantly inattentive type: Secondary | ICD-10-CM | POA: Diagnosis not present

## 2022-08-30 DIAGNOSIS — F411 Generalized anxiety disorder: Secondary | ICD-10-CM | POA: Diagnosis not present

## 2022-08-30 DIAGNOSIS — Z79899 Other long term (current) drug therapy: Secondary | ICD-10-CM | POA: Diagnosis not present

## 2022-09-06 ENCOUNTER — Other Ambulatory Visit: Payer: Self-pay | Admitting: Nurse Practitioner

## 2022-09-06 DIAGNOSIS — F332 Major depressive disorder, recurrent severe without psychotic features: Secondary | ICD-10-CM

## 2022-09-06 DIAGNOSIS — E1169 Type 2 diabetes mellitus with other specified complication: Secondary | ICD-10-CM

## 2022-09-06 DIAGNOSIS — E1165 Type 2 diabetes mellitus with hyperglycemia: Secondary | ICD-10-CM

## 2022-09-06 NOTE — Telephone Encounter (Signed)
Je NTBS 30 days given 08/04/22

## 2022-09-07 ENCOUNTER — Encounter: Payer: Self-pay | Admitting: Nurse Practitioner

## 2022-09-07 NOTE — Telephone Encounter (Signed)
Letter sent.

## 2022-10-10 DIAGNOSIS — J441 Chronic obstructive pulmonary disease with (acute) exacerbation: Secondary | ICD-10-CM | POA: Diagnosis not present

## 2022-10-10 DIAGNOSIS — Z720 Tobacco use: Secondary | ICD-10-CM | POA: Diagnosis not present

## 2022-10-10 DIAGNOSIS — R0602 Shortness of breath: Secondary | ICD-10-CM | POA: Diagnosis not present

## 2022-10-10 DIAGNOSIS — R059 Cough, unspecified: Secondary | ICD-10-CM | POA: Diagnosis not present

## 2022-10-12 ENCOUNTER — Other Ambulatory Visit: Payer: Self-pay | Admitting: Family Medicine

## 2022-10-12 DIAGNOSIS — J441 Chronic obstructive pulmonary disease with (acute) exacerbation: Secondary | ICD-10-CM

## 2022-10-14 ENCOUNTER — Ambulatory Visit (INDEPENDENT_AMBULATORY_CARE_PROVIDER_SITE_OTHER): Payer: Medicaid Other | Admitting: Nurse Practitioner

## 2022-10-14 ENCOUNTER — Encounter: Payer: Self-pay | Admitting: Nurse Practitioner

## 2022-10-14 VITALS — BP 138/86 | HR 107 | Temp 98.7°F | Ht 64.0 in | Wt 224.0 lb

## 2022-10-14 DIAGNOSIS — E785 Hyperlipidemia, unspecified: Secondary | ICD-10-CM

## 2022-10-14 DIAGNOSIS — E1165 Type 2 diabetes mellitus with hyperglycemia: Secondary | ICD-10-CM

## 2022-10-14 DIAGNOSIS — F332 Major depressive disorder, recurrent severe without psychotic features: Secondary | ICD-10-CM

## 2022-10-14 DIAGNOSIS — Z0000 Encounter for general adult medical examination without abnormal findings: Secondary | ICD-10-CM

## 2022-10-14 DIAGNOSIS — E1169 Type 2 diabetes mellitus with other specified complication: Secondary | ICD-10-CM | POA: Diagnosis not present

## 2022-10-14 DIAGNOSIS — J441 Chronic obstructive pulmonary disease with (acute) exacerbation: Secondary | ICD-10-CM | POA: Diagnosis not present

## 2022-10-14 DIAGNOSIS — E119 Type 2 diabetes mellitus without complications: Secondary | ICD-10-CM

## 2022-10-14 DIAGNOSIS — J449 Chronic obstructive pulmonary disease, unspecified: Secondary | ICD-10-CM

## 2022-10-14 LAB — BAYER DCA HB A1C WAIVED: HB A1C (BAYER DCA - WAIVED): 8.7 % — ABNORMAL HIGH (ref 4.8–5.6)

## 2022-10-14 LAB — LIPID PANEL

## 2022-10-14 MED ORDER — ALBUTEROL SULFATE HFA 108 (90 BASE) MCG/ACT IN AERS: 1.0000 | INHALATION_SPRAY | Freq: Four times a day (QID) | RESPIRATORY_TRACT | 3 refills | Status: AC | PRN

## 2022-10-14 MED ORDER — QUETIAPINE FUMARATE 100 MG PO TABS: 200.0000 mg | ORAL_TABLET | Freq: Every day | ORAL | 0 refills | Status: AC

## 2022-10-14 MED ORDER — DAPAGLIFLOZIN PROPANEDIOL 10 MG PO TABS: 10.0000 mg | ORAL_TABLET | Freq: Every day | ORAL | 0 refills | Status: DC

## 2022-10-14 MED ORDER — FLUTICASONE PROPIONATE 50 MCG/ACT NA SUSP: 2.0000 | Freq: Every day | NASAL | 6 refills | Status: AC

## 2022-10-14 MED ORDER — ATORVASTATIN CALCIUM 20 MG PO TABS: 20.0000 mg | ORAL_TABLET | Freq: Every day | ORAL | 0 refills | Status: DC

## 2022-10-14 MED ORDER — METFORMIN HCL 1000 MG PO TABS: 1000.0000 mg | ORAL_TABLET | Freq: Two times a day (BID) | ORAL | 0 refills | Status: DC

## 2022-10-14 MED ORDER — CETIRIZINE HCL 10 MG PO TABS: 10.0000 mg | ORAL_TABLET | Freq: Every day | ORAL | 0 refills | Status: AC

## 2022-10-14 NOTE — Progress Notes (Signed)
Established Patient Office Visit  Subjective   Patient ID: Kimberly Green, female    DOB: 09-07-1961  Age: 62 y.o. MRN: [id number]  Chief Complaint  Patient presents with   chronic disease mangement    HPI  The patient presents with history of type 2 diabetes mellitus without complications. Patient was diagnosed 11/28/2018. Compliance with treatment has been good; the patient takes medication as directed , maintains a diabetic diet and an exercise regimen , follows up as directed , and is keeping a glucose diary. Patient specifically denies associated symptoms, including blurred vision, fatigue, polydipsia, polyphagia and polyuria . Patient denies hypoglycemia. In regard to preventative care, the patient performs foot self-exams as needed    Mixed hyperlipidemia  Pt presents with hyperlipidemia. Patient was diagnosed in 2018. Compliance with treatment has been good; The patient is compliant with medications, maintains a low cholesterol diet , follows up as directed , and maintains an exercise regimen . The patient denies experiencing any hypercholesterolemia related symptoms.      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/22/2021   Morbid obesity with BMI of 40.0-44.9, adult ([place]) 02/21/2021   Attention deficit disorder 09/14/2020   Other viral warts 07/24/2020   Lump of skin of back 07/20/2020   Elevated liver enzymes 09/14/2019   Chronic right-sided low back pain with right-sided sciatica 09/14/2019   Lumbar spondylosis 08/26/2019   Diabetes mellitus without complication ([place]) 04/02/7627   Trigger middle finger of left hand 11/28/2018   Upper airway cough syndrome 03/16/2018   Cigarette smoker 03/16/2018   OSA (obstructive sleep apnea) 03/02/2018   COPD (chronic obstructive pulmonary disease) ([place]) 11/17/2016   History of renal  cell cancer 11/17/2016   Hyperlipidemia associated with type 2 diabetes mellitus ([place]) 11/17/2016   Essential hypertension 11/17/2016   Past Medical History:  Diagnosis Date   Adrenal tumor, benign    S/P L adrenal resection, 10/11 ([place])   Bipolar disorder ([place])    Cervical cancer ([place]) 1987   Cholelithiasis    COPD (chronic obstructive pulmonary disease) ([place])    Diabetes mellitus without complication ([place])    Essential hypertension, benign    IBS (irritable bowel syndrome)    Mixed hyperlipidemia    PTSD (post-traumatic stress disorder)    Renal carcinoma (HCC)    S/P partial R nephrectomy (1/3 removed), 8/11 [facility])   Sleep apnea    Past Surgical History:  Procedure Laterality Date   ADRENAL GLAND SURGERY     BACK SURGERY     CERVICAL CONE BIOPSY     cervical cancer   CHOLECYSTECTOMY     KIDNEY MASS REMOVAL     NECK SURGERY     RIGHT WRIST SURGERY     SHOULDER SURGERY Left    TUBAL LIGATION     Social History   Tobacco Use   Smoking status: Every Day    Packs/day: 0.25    Years: 45.00    Total pack years: 11.25    Types: Cigarettes   Smokeless tobacco: Never  Vaping Use   Vaping Use: Never used  Substance Use Topics   Alcohol use: No   Drug use: No   Social History   Socioeconomic History   Marital status: Divorced    Spouse name: Not on file   Number of children: 2   Years of education: 48  Highest education level: Not on file  Occupational History   Not on file  Tobacco Use   Smoking status: Every Day    Packs/day: 0.25    Years: 45.00    Total pack years: 11.25    Types: Cigarettes   Smokeless tobacco: Never  Vaping Use   Vaping Use: Never used  Substance and Sexual Activity   Alcohol use: No   Drug use: No   Sexual activity: Not Currently  Other Topics Concern   Not on file  Social History Narrative   Drinks 1 cup of coffee a day    Social Determinants of Health   Financial Resource Strain: Not on file  Food Insecurity: Not on  file  Transportation Needs: Not on file  Physical Activity: Not on file  Stress: Not on file  Social Connections: Not on file  Intimate Partner Violence: Not on file   Family Status  Relation Name Status   Mother  Alive       breast and uterine   Father  Deceased   Other  (Not Specified)   Other  (Not Specified)   Sister  Alive   Brother  [place]  Deceased   MGF  Deceased   [place]  Deceased   PGF  Deceased   Brother  Alive   Brother  Alive   Brother  Alive   Brother  Bynum   Brother  Bruno   Brother  Alive   Brother  Deceased   Brother  Deceased   Sister  Alive   Sister  Alive   Sister  Alive   Sister  Alive   Sister  Deceased   Daughter  Alive   Daughter  Alive   Family History  Problem Relation Age of Onset   Coronary artery disease Mother 26   COPD Mother    Hypertension Mother    Breast cancer Mother    Uterine cancer Mother    Heart failure Father 6   Heart disease Father    Colon cancer Other        Aunt   Lung cancer Other        Aunt   Diabetes Sister    Heart disease Sister    Heart failure Maternal Grandmother    Heart failure Paternal Grandmother    Cerebral palsy Brother    Early death Brother        MVA   Diabetes Sister    Heart disease Sister    Valvular heart disease Sister    Early death Sister        Murdered   Uterine cancer Daughter    Allergies  Allergen Reactions   Lexiscan [Regadenoson] Shortness Of Breath    Severe transient respiratory distress   Niacin Itching      Review of Systems  Constitutional: Negative.  Negative for fever.  HENT: Negative.    Respiratory: Negative.    Cardiovascular: Negative.   Gastrointestinal: Negative.   Genitourinary: Negative.   Musculoskeletal: Negative.   Skin: Negative.   Neurological: Negative.   All other systems reviewed and are negative.     Objective:     BP 138/86   Pulse (!) 107   Temp 98.7 F (37.1 C)   Ht '5\' 4"'$  (1.626 m)   Wt 224 lb (101.6 kg)   LMP  10/15/2015   SpO2 94%   BMI 38.45 kg/m  BP Readings from Last 3 Encounters:  10/14/22 138/86  12/16/21  128/80  04/21/21 137/84   Wt Readings from Last 3 Encounters:  10/14/22 224 lb (101.6 kg)  12/16/21 231 lb 3.2 oz (104.9 kg)  04/21/21 247 lb 3.2 oz (112.1 kg)      Physical Exam Vitals reviewed.  Constitutional:      Appearance: She is obese.  HENT:     Right Ear: External ear normal.     Left Ear: External ear normal.     Nose: Nose normal.  Eyes:     Conjunctiva/sclera: Conjunctivae normal.  Cardiovascular:     Rate and Rhythm: Normal rate.     Pulses: Normal pulses.     Heart sounds: Normal heart sounds.  Pulmonary:     Effort: Pulmonary effort is normal.     Breath sounds: Normal breath sounds.  Abdominal:     General: Bowel sounds are normal.  Skin:    General: Skin is warm.     Findings: No erythema or rash.  Neurological:     Mental Status: She is alert and oriented to person, place, and time.  Psychiatric:        Behavior: Behavior normal.      No results found for any visits on 10/14/22.  Last CBC Lab Results  Component Value Date   WBC 7.6 12/16/2021   HGB 14.5 12/16/2021   HCT 42.4 12/16/2021   MCV 87 12/16/2021   MCH 29.6 12/16/2021   RDW 12.1 12/16/2021   PLT 214 16/07/9603   Last metabolic panel Lab Results  Component Value Date   GLUCOSE 159 (H) 12/16/2021   NA 144 12/16/2021   K 4.2 12/16/2021   CL 105 12/16/2021   CO2 22 12/16/2021   BUN 9 12/16/2021   CREATININE 0.79 12/16/2021   EGFR 86 12/16/2021   CALCIUM 9.6 12/16/2021   PROT 6.4 12/16/2021   ALBUMIN 4.0 12/16/2021   LABGLOB 2.4 12/16/2021   AGRATIO 1.7 12/16/2021   BILITOT <0.2 12/16/2021   ALKPHOS 87 12/16/2021   AST 48 (H) 12/16/2021   ALT 42 (H) 12/16/2021   Last lipids Lab Results  Component Value Date   CHOL 170 12/16/2021   HDL 32 (L) 12/16/2021   LDLCALC 72 12/16/2021   TRIG 421 (H) 12/16/2021   CHOLHDL 5.3 (H) 12/16/2021   Last hemoglobin  A1c Lab Results  Component Value Date   HGBA1C 7.6 (H) 12/16/2021   Last thyroid functions Lab Results  Component Value Date   TSH 4.430 07/19/2017      The 10-year ASCVD risk score (Arnett DK, et al., 2019) is: 23.1%    Assessment & Plan:   Problem List Items Addressed This Visit       Respiratory   COPD (chronic obstructive pulmonary disease) ([place])    RX refill for breathing treatment sent to pharmacy      Relevant Medications   albuterol (VENTOLIN HFA) 108 (90 Base) MCG/ACT inhaler   fluticasone (FLONASE) 50 MCG/ACT nasal spray   cetirizine (ZYRTEC) 10 MG tablet     Endocrine   Hyperlipidemia associated with type 2 diabetes mellitus ([place])    Labs completed results pending. Symptoms well controlled on current regimen.       Relevant Medications   atorvastatin (LIPITOR) 20 MG tablet   dapagliflozin propanediol (FARXIGA) 10 MG TABS tablet   metFORMIN (GLUCOPHAGE) 1000 MG tablet   Diabetes mellitus without complication (HCC)    No changes to current medication regimen, symptoms are well controlled on current regimen. Follow up in 3-6 months.  Relevant Medications   albuterol (VENTOLIN HFA) 108 (90 Base) MCG/ACT inhaler   atorvastatin (LIPITOR) 20 MG tablet   dapagliflozin propanediol (FARXIGA) 10 MG TABS tablet   metFORMIN (GLUCOPHAGE) 1000 MG tablet     Other   Severe episode of recurrent major depressive disorder, without psychotic features (HCC)   Relevant Medications   QUEtiapine (SEROQUEL) 100 MG tablet   Other Visit Diagnoses     Type 2 diabetes mellitus with hyperglycemia, without long-term current use of insulin (HCC)    -  Primary   Relevant Medications   albuterol (VENTOLIN HFA) 108 (90 Base) MCG/ACT inhaler   atorvastatin (LIPITOR) 20 MG tablet   dapagliflozin propanediol (FARXIGA) 10 MG TABS tablet   metFORMIN (GLUCOPHAGE) 1000 MG tablet   Other Relevant Orders   Bayer DCA Hb A1c Waived   CMP14+EGFR   Lipid panel   Microalbumin /  creatinine urine ratio   Vitamin B12   TSH   COPD exacerbation (HCC)       Relevant Medications   albuterol (VENTOLIN HFA) 108 (90 Base) MCG/ACT inhaler   fluticasone (FLONASE) 50 MCG/ACT nasal spray   cetirizine (ZYRTEC) 10 MG tablet   Healthcare maintenance       Relevant Orders   Cologuard       Return in about 3 months (around 01/13/2023) for chronic disease management.    Ivy Amenah, NP

## 2022-10-14 NOTE — Assessment & Plan Note (Signed)
Labs completed results pending. Symptoms well controlled on current regimen.

## 2022-10-14 NOTE — Assessment & Plan Note (Signed)
RX refill for breathing treatment sent to pharmacy

## 2022-10-14 NOTE — Assessment & Plan Note (Signed)
No changes to current medication regimen, symptoms are well controlled on current regimen. Follow up in 3-6 months.

## 2022-10-14 NOTE — Patient Instructions (Signed)
Diabetes Insipidus Diabetes insipidus (DI) is a rare condition that causes the body to produce more urine than normal. This leads to thirst and low fluid in the body (dehydration). In this condition, the urine is made mostly of water, or dilute urine. DI affects mostly adults, but it can happen at any age. There are four types of DI: Central DI. This is the most common type. Dipsogenic DI. Nephrogenic DI. Gestational DI. The most common forms of this condition are caused by a decrease in the production of the hormone that regulates urine output (antidiuretic hormone), or the body's resistance to this hormone. This condition is not related to type 1 or type 2 diabetes mellitus. What are the causes? Central DI is caused by damage to the pituitary gland or hypothalamus in the brain. Dipsogenic DI is caused by a defect in the thirst mechanism in the brain. This defect causes you to drink too much fluid. These may result from: Brain surgery. Infection. Inflammation. Brain tumor. Head injury. Nephrogenic DI is caused by the kidneys not responding to the antidiuretic hormone in the body. This may result from: Chronic kidney disease (CKD). Certain medicines, such as lithium. Low potassium levels. High calcium levels. Gestational DI is rare and is caused by the antidiuretic hormone that has stopped working properly. What are the signs or symptoms? Symptoms of this condition include: Excessive urination. This means urinating more than 10 cups (2.4 L) during a period of 24 hours. Excessive thirst. Too much nighttime urination (nocturia). Nausea. Diarrhea. How is this diagnosed? This condition may be diagnosed based on: Your medical history. A physical exam. Blood tests. Urine tests. A water deprivation test. During this test, you will stop drinking fluids for a period of time and your blood and urine will be checked regularly. An MRI. How is this treated? Once your specific type of  diabetes insipidus is diagnosed, treatment may include one or more of the following: Increasing or limiting your fluid intake. Taking medicines that contain artificial (synthetic) versions of the antidiuretic hormone. Stopping certain medicines that you take. Correcting the balance of minerals (electrolytes) in your body. Changing your diet. You may be put on a low-protein and low-sodium diet. You may need to visit your health care provider regularly to make sure your condition is being treated properly. You may also need to work with providers who specialize in: Kidney problems (nephrologist). Hormone disorders (endocrinologist). Follow these instructions at home:  Eating and drinking Follow instructions from your health care provider about how much fluid and water to drink. You may be directed to drink more fluids and water, or to limit how much fluid and water you drink. Follow instructions from your health care provider about eating or drinking restrictions. General instructions Take over-the-counter and prescription medicines only as told by your health care provider. If directed, monitor your risk of dehydration in extreme heat. Carry a medical alert card or wear medical alert jewelry. Keep all follow-up visits as told by your health care provider. This is important. You may need to visit your health care provider regularly to make sure your condition is being treated properly. Contact a health care provider if: You continue to have symptoms after treatment. Get help right away if: You have extreme thirst. You have symptoms of severe dehydration, such as rapid heart rate, muscle cramps, or confusion. Summary Diabetes insipidus (DI) is a rare condition that causes the body to produce more urine than normal, which leads to thirst and dehydration. Follow instructions   from your health care provider about eating or drinking restrictions. Treatment may include increasing or limiting your  fluid intake and correcting the balance of minerals (electrolytes) in your body. Get help right away if you have symptoms of severe dehydration, such as rapid heart rate, muscle cramps, or confusion. This information is not intended to replace advice given to you by your health care provider. Make sure you discuss any questions you have with your health care provider. Document Revised: 03/01/2022 Document Reviewed: 10/30/2019 Elsevier Patient Education  2023 Elsevier Inc.  

## 2022-10-15 LAB — LIPID PANEL
Chol/HDL Ratio: 5.7 ratio — ABNORMAL HIGH (ref 0.0–4.4)
Cholesterol, Total: 240 mg/dL — ABNORMAL HIGH (ref 100–199)
HDL: 42 mg/dL (ref >39)
LDL Chol Calc (NIH): 111 mg/dL — ABNORMAL HIGH (ref 0–99)
Triglycerides: 504 mg/dL — ABNORMAL HIGH (ref 0–149)
VLDL Cholesterol Cal: 87 mg/dL — ABNORMAL HIGH (ref 5–40)

## 2022-10-15 LAB — CMP14+EGFR
ALT: 51 IU/L — ABNORMAL HIGH (ref 0–32)
AST: 37 IU/L (ref 0–40)
Albumin/Globulin Ratio: 1.8 (ref 1.2–2.2)
Albumin: 4.5 g/dL (ref 3.8–4.9)
Alkaline Phosphatase: 85 IU/L (ref 44–121)
BUN/Creatinine Ratio: 15 (ref 9–23)
BUN: 17 mg/dL (ref 6–24)
Bilirubin Total: 0.2 mg/dL (ref 0.0–1.2)
CO2: 20 mmol/L (ref 20–29)
Calcium: 10.1 mg/dL (ref 8.7–10.2)
Chloride: 100 mmol/L (ref 96–106)
Creatinine, Ser: 1.11 mg/dL — ABNORMAL HIGH (ref 0.57–1.00)
Globulin, Total: 2.5 g/dL (ref 1.5–4.5)
Glucose: 269 mg/dL — ABNORMAL HIGH (ref 70–99)
Potassium: 3.9 mmol/L (ref 3.5–5.2)
Sodium: 138 mmol/L (ref 134–144)
Total Protein: 7 g/dL (ref 6.0–8.5)
eGFR: 57 mL/min/{1.73_m2} — ABNORMAL LOW (ref >59)

## 2022-10-15 LAB — TSH: TSH: 2.56 u[IU]/mL (ref 0.450–4.500)

## 2022-10-15 LAB — VITAMIN B12: Vitamin B-12: 588 pg/mL (ref 232–1245)

## 2022-10-16 LAB — MICROALBUMIN / CREATININE URINE RATIO
Creatinine, Urine: 121.7 mg/dL
Microalb/Creat Ratio: 15 mg/g creat (ref 0–29)
Microalbumin, Urine: 18.4 ug/mL

## 2022-10-27 ENCOUNTER — Telehealth: Payer: Self-pay | Admitting: Nurse Practitioner

## 2022-10-27 NOTE — Telephone Encounter (Signed)
I spoke to pt and she is transferring care to closer to her home and has already signed records release to have her records sent to new PCP.

## 2022-11-08 ENCOUNTER — Other Ambulatory Visit: Payer: Self-pay | Admitting: Family Medicine

## 2022-11-08 ENCOUNTER — Other Ambulatory Visit: Payer: Self-pay

## 2022-11-08 DIAGNOSIS — E1165 Type 2 diabetes mellitus with hyperglycemia: Secondary | ICD-10-CM

## 2022-11-08 DIAGNOSIS — E1169 Type 2 diabetes mellitus with other specified complication: Secondary | ICD-10-CM

## 2022-11-08 MED ORDER — ATORVASTATIN CALCIUM 20 MG PO TABS: 20.0000 mg | ORAL_TABLET | Freq: Every day | ORAL | 5 refills | Status: AC

## 2022-11-22 DIAGNOSIS — F331 Major depressive disorder, recurrent, moderate: Secondary | ICD-10-CM | POA: Diagnosis not present

## 2022-11-22 DIAGNOSIS — F900 Attention-deficit hyperactivity disorder, predominantly inattentive type: Secondary | ICD-10-CM | POA: Diagnosis not present

## 2022-11-22 DIAGNOSIS — F411 Generalized anxiety disorder: Secondary | ICD-10-CM | POA: Diagnosis not present

## 2022-12-16 DIAGNOSIS — Z1231 Encounter for screening mammogram for malignant neoplasm of breast: Secondary | ICD-10-CM | POA: Diagnosis not present

## 2022-12-27 DIAGNOSIS — K432 Incisional hernia without obstruction or gangrene: Secondary | ICD-10-CM | POA: Diagnosis not present

## 2022-12-28 ENCOUNTER — Other Ambulatory Visit: Payer: Self-pay | Admitting: Family Medicine

## 2022-12-28 DIAGNOSIS — F332 Major depressive disorder, recurrent severe without psychotic features: Secondary | ICD-10-CM

## 2022-12-28 NOTE — Telephone Encounter (Signed)
Tried calling patient to schedule her an appt to establish care with Donzetta Kohut so that she could get refills on her medications, but patient did not answer and no VM set up. Phone just kept ringing.

## 2022-12-28 NOTE — Telephone Encounter (Signed)
Patient has already re est with another office

## 2023-01-06 ENCOUNTER — Other Ambulatory Visit: Payer: Self-pay | Admitting: Family Medicine

## 2023-01-06 DIAGNOSIS — F332 Major depressive disorder, recurrent severe without psychotic features: Secondary | ICD-10-CM

## 2023-01-16 ENCOUNTER — Other Ambulatory Visit: Payer: Self-pay | Admitting: *Deleted

## 2023-01-16 DIAGNOSIS — J441 Chronic obstructive pulmonary disease with (acute) exacerbation: Secondary | ICD-10-CM

## 2023-01-23 DIAGNOSIS — K429 Umbilical hernia without obstruction or gangrene: Secondary | ICD-10-CM | POA: Diagnosis not present

## 2023-01-26 ENCOUNTER — Other Ambulatory Visit: Payer: Self-pay | Admitting: Family Medicine

## 2023-01-26 DIAGNOSIS — E1165 Type 2 diabetes mellitus with hyperglycemia: Secondary | ICD-10-CM

## 2023-02-14 DIAGNOSIS — F900 Attention-deficit hyperactivity disorder, predominantly inattentive type: Secondary | ICD-10-CM | POA: Diagnosis not present

## 2023-02-14 DIAGNOSIS — F411 Generalized anxiety disorder: Secondary | ICD-10-CM | POA: Diagnosis not present

## 2023-02-14 DIAGNOSIS — F331 Major depressive disorder, recurrent, moderate: Secondary | ICD-10-CM | POA: Diagnosis not present

## 2023-02-20 DIAGNOSIS — J385 Laryngeal spasm: Secondary | ICD-10-CM | POA: Diagnosis not present

## 2023-02-27 NOTE — Telephone Encounter (Signed)
Erroneous encounter will close.

## 2023-03-07 DIAGNOSIS — Z0000 Encounter for general adult medical examination without abnormal findings: Secondary | ICD-10-CM | POA: Diagnosis not present

## 2023-03-07 DIAGNOSIS — E119 Type 2 diabetes mellitus without complications: Secondary | ICD-10-CM | POA: Diagnosis not present

## 2023-03-07 DIAGNOSIS — E785 Hyperlipidemia, unspecified: Secondary | ICD-10-CM | POA: Diagnosis not present

## 2023-03-07 DIAGNOSIS — Z124 Encounter for screening for malignant neoplasm of cervix: Secondary | ICD-10-CM | POA: Diagnosis not present

## 2023-03-07 DIAGNOSIS — E559 Vitamin D deficiency, unspecified: Secondary | ICD-10-CM | POA: Diagnosis not present

## 2023-03-17 DIAGNOSIS — F900 Attention-deficit hyperactivity disorder, predominantly inattentive type: Secondary | ICD-10-CM | POA: Diagnosis not present

## 2023-03-17 DIAGNOSIS — F411 Generalized anxiety disorder: Secondary | ICD-10-CM | POA: Diagnosis not present

## 2023-03-17 DIAGNOSIS — F331 Major depressive disorder, recurrent, moderate: Secondary | ICD-10-CM | POA: Diagnosis not present

## 2023-04-11 DIAGNOSIS — F331 Major depressive disorder, recurrent, moderate: Secondary | ICD-10-CM | POA: Diagnosis not present

## 2023-04-11 DIAGNOSIS — F411 Generalized anxiety disorder: Secondary | ICD-10-CM | POA: Diagnosis not present

## 2023-04-11 DIAGNOSIS — F900 Attention-deficit hyperactivity disorder, predominantly inattentive type: Secondary | ICD-10-CM | POA: Diagnosis not present

## 2023-04-30 ENCOUNTER — Other Ambulatory Visit: Payer: Self-pay | Admitting: Family Medicine

## 2023-04-30 DIAGNOSIS — E1165 Type 2 diabetes mellitus with hyperglycemia: Secondary | ICD-10-CM

## 2023-04-30 DIAGNOSIS — E1169 Type 2 diabetes mellitus with other specified complication: Secondary | ICD-10-CM

## 2023-05-02 NOTE — Telephone Encounter (Signed)
Called patient and she stated that she is going to a different practice now.

## 2023-05-08 ENCOUNTER — Other Ambulatory Visit: Payer: Self-pay | Admitting: Family Medicine

## 2023-05-08 DIAGNOSIS — E1169 Type 2 diabetes mellitus with other specified complication: Secondary | ICD-10-CM

## 2023-05-08 DIAGNOSIS — E1165 Type 2 diabetes mellitus with hyperglycemia: Secondary | ICD-10-CM

## 2023-06-16 DIAGNOSIS — F331 Major depressive disorder, recurrent, moderate: Secondary | ICD-10-CM | POA: Diagnosis not present

## 2023-06-16 DIAGNOSIS — F411 Generalized anxiety disorder: Secondary | ICD-10-CM | POA: Diagnosis not present

## 2023-06-16 DIAGNOSIS — F900 Attention-deficit hyperactivity disorder, predominantly inattentive type: Secondary | ICD-10-CM | POA: Diagnosis not present

## 2023-09-06 DIAGNOSIS — F411 Generalized anxiety disorder: Secondary | ICD-10-CM | POA: Diagnosis not present

## 2023-09-06 DIAGNOSIS — F900 Attention-deficit hyperactivity disorder, predominantly inattentive type: Secondary | ICD-10-CM | POA: Diagnosis not present

## 2023-09-06 DIAGNOSIS — F331 Major depressive disorder, recurrent, moderate: Secondary | ICD-10-CM | POA: Diagnosis not present

## 2023-11-27 DIAGNOSIS — J441 Chronic obstructive pulmonary disease with (acute) exacerbation: Secondary | ICD-10-CM | POA: Diagnosis not present

## 2023-11-27 DIAGNOSIS — L03312 Cellulitis of back [any part except buttock]: Secondary | ICD-10-CM | POA: Diagnosis not present

## 2023-11-27 DIAGNOSIS — R051 Acute cough: Secondary | ICD-10-CM | POA: Diagnosis not present

## 2023-11-27 DIAGNOSIS — J069 Acute upper respiratory infection, unspecified: Secondary | ICD-10-CM | POA: Diagnosis not present

## 2023-12-11 DIAGNOSIS — E785 Hyperlipidemia, unspecified: Secondary | ICD-10-CM | POA: Diagnosis not present

## 2023-12-11 DIAGNOSIS — M779 Enthesopathy, unspecified: Secondary | ICD-10-CM | POA: Diagnosis not present

## 2023-12-11 DIAGNOSIS — Z23 Encounter for immunization: Secondary | ICD-10-CM | POA: Diagnosis not present

## 2023-12-11 DIAGNOSIS — H6192 Disorder of left external ear, unspecified: Secondary | ICD-10-CM | POA: Diagnosis not present

## 2023-12-11 DIAGNOSIS — M62838 Other muscle spasm: Secondary | ICD-10-CM | POA: Diagnosis not present

## 2023-12-11 DIAGNOSIS — E1165 Type 2 diabetes mellitus with hyperglycemia: Secondary | ICD-10-CM | POA: Diagnosis not present

## 2023-12-11 DIAGNOSIS — E669 Obesity, unspecified: Secondary | ICD-10-CM | POA: Diagnosis not present

## 2023-12-11 DIAGNOSIS — E559 Vitamin D deficiency, unspecified: Secondary | ICD-10-CM | POA: Diagnosis not present

## 2023-12-11 DIAGNOSIS — R000 Tachycardia, unspecified: Secondary | ICD-10-CM | POA: Diagnosis not present

## 2023-12-11 DIAGNOSIS — K469 Unspecified abdominal hernia without obstruction or gangrene: Secondary | ICD-10-CM | POA: Diagnosis not present

## 2023-12-11 DIAGNOSIS — J385 Laryngeal spasm: Secondary | ICD-10-CM | POA: Diagnosis not present

## 2023-12-11 DIAGNOSIS — J449 Chronic obstructive pulmonary disease, unspecified: Secondary | ICD-10-CM | POA: Diagnosis not present

## 2024-02-20 DIAGNOSIS — E669 Obesity, unspecified: Secondary | ICD-10-CM | POA: Diagnosis not present

## 2024-02-20 DIAGNOSIS — R059 Cough, unspecified: Secondary | ICD-10-CM | POA: Diagnosis not present

## 2024-02-20 DIAGNOSIS — J441 Chronic obstructive pulmonary disease with (acute) exacerbation: Secondary | ICD-10-CM | POA: Diagnosis not present

## 2024-02-20 DIAGNOSIS — J209 Acute bronchitis, unspecified: Secondary | ICD-10-CM | POA: Diagnosis not present

## 2024-02-20 DIAGNOSIS — Z6837 Body mass index (BMI) 37.0-37.9, adult: Secondary | ICD-10-CM | POA: Diagnosis not present

## 2024-02-21 DIAGNOSIS — L821 Other seborrheic keratosis: Secondary | ICD-10-CM | POA: Diagnosis not present

## 2024-02-21 DIAGNOSIS — L720 Epidermal cyst: Secondary | ICD-10-CM | POA: Diagnosis not present

## 2024-03-18 DIAGNOSIS — L720 Epidermal cyst: Secondary | ICD-10-CM | POA: Diagnosis not present

## 2024-03-18 DIAGNOSIS — L308 Other specified dermatitis: Secondary | ICD-10-CM | POA: Diagnosis not present

## 2024-05-07 DIAGNOSIS — M779 Enthesopathy, unspecified: Secondary | ICD-10-CM | POA: Diagnosis not present

## 2024-05-07 DIAGNOSIS — F418 Other specified anxiety disorders: Secondary | ICD-10-CM | POA: Diagnosis not present

## 2024-05-17 DIAGNOSIS — Z20822 Contact with and (suspected) exposure to covid-19: Secondary | ICD-10-CM | POA: Diagnosis not present

## 2024-05-17 DIAGNOSIS — J441 Chronic obstructive pulmonary disease with (acute) exacerbation: Secondary | ICD-10-CM | POA: Diagnosis not present

## 2024-05-17 DIAGNOSIS — Z720 Tobacco use: Secondary | ICD-10-CM | POA: Diagnosis not present

## 2024-05-17 DIAGNOSIS — R058 Other specified cough: Secondary | ICD-10-CM | POA: Diagnosis not present

## 2024-05-17 DIAGNOSIS — R053 Chronic cough: Secondary | ICD-10-CM | POA: Diagnosis not present

## 2024-05-20 DIAGNOSIS — Z1231 Encounter for screening mammogram for malignant neoplasm of breast: Secondary | ICD-10-CM | POA: Diagnosis not present

## 2024-06-05 DIAGNOSIS — R0600 Dyspnea, unspecified: Secondary | ICD-10-CM | POA: Diagnosis not present

## 2024-06-05 DIAGNOSIS — K439 Ventral hernia without obstruction or gangrene: Secondary | ICD-10-CM | POA: Diagnosis not present

## 2024-06-05 DIAGNOSIS — R55 Syncope and collapse: Secondary | ICD-10-CM | POA: Diagnosis not present

## 2024-06-05 DIAGNOSIS — R918 Other nonspecific abnormal finding of lung field: Secondary | ICD-10-CM | POA: Diagnosis not present

## 2024-06-05 DIAGNOSIS — R0602 Shortness of breath: Secondary | ICD-10-CM | POA: Diagnosis not present

## 2024-06-05 DIAGNOSIS — R002 Palpitations: Secondary | ICD-10-CM | POA: Diagnosis not present

## 2024-06-05 DIAGNOSIS — J432 Centrilobular emphysema: Secondary | ICD-10-CM | POA: Diagnosis not present

## 2024-06-05 DIAGNOSIS — J441 Chronic obstructive pulmonary disease with (acute) exacerbation: Secondary | ICD-10-CM | POA: Diagnosis not present

## 2024-06-06 DIAGNOSIS — R079 Chest pain, unspecified: Secondary | ICD-10-CM | POA: Diagnosis not present

## 2024-06-17 DIAGNOSIS — J441 Chronic obstructive pulmonary disease with (acute) exacerbation: Secondary | ICD-10-CM | POA: Diagnosis not present

## 2024-06-17 DIAGNOSIS — E1165 Type 2 diabetes mellitus with hyperglycemia: Secondary | ICD-10-CM | POA: Diagnosis not present

## 2024-06-17 DIAGNOSIS — E785 Hyperlipidemia, unspecified: Secondary | ICD-10-CM | POA: Diagnosis not present

## 2024-07-01 DIAGNOSIS — R002 Palpitations: Secondary | ICD-10-CM | POA: Diagnosis not present

## 2024-07-01 DIAGNOSIS — Z720 Tobacco use: Secondary | ICD-10-CM | POA: Diagnosis not present

## 2024-07-16 DIAGNOSIS — J984 Other disorders of lung: Secondary | ICD-10-CM | POA: Diagnosis not present

## 2024-07-16 DIAGNOSIS — J441 Chronic obstructive pulmonary disease with (acute) exacerbation: Secondary | ICD-10-CM | POA: Diagnosis not present

## 2024-07-22 ENCOUNTER — Encounter: Payer: Self-pay | Admitting: Internal Medicine

## 2024-07-22 ENCOUNTER — Ambulatory Visit: Admitting: Internal Medicine

## 2024-07-22 VITALS — BP 107/73 | HR 92 | Ht 64.0 in | Wt 214.0 lb

## 2024-07-22 DIAGNOSIS — F17210 Nicotine dependence, cigarettes, uncomplicated: Secondary | ICD-10-CM | POA: Diagnosis not present

## 2024-07-22 DIAGNOSIS — R058 Other specified cough: Secondary | ICD-10-CM | POA: Diagnosis not present

## 2024-07-22 DIAGNOSIS — R0609 Other forms of dyspnea: Secondary | ICD-10-CM | POA: Diagnosis not present

## 2024-07-22 MED ORDER — FAMOTIDINE 20 MG PO TABS: ORAL_TABLET | ORAL | 11 refills | Status: AC

## 2024-07-22 MED ORDER — BUDESONIDE 0.25 MG/2ML IN SUSP: RESPIRATORY_TRACT | 12 refills | Status: AC

## 2024-07-22 MED ORDER — PANTOPRAZOLE SODIUM 40 MG PO TBEC: 40.0000 mg | DELAYED_RELEASE_TABLET | Freq: Every day | ORAL | 2 refills | Status: DC

## 2024-07-22 NOTE — Patient Instructions (Addendum)
 My office will be contacting you by phone for referral to [facility]   - if you don't hear back from my office within one week please call us  back or notify us  thru MyChart and we'll address it right away.   Albuterol  nebulizer with budesonide 0.25 mg 1st thing in am and 12 hours  Use your albuterol  as a rescue medication to be used if you can't catch your breath by resting, slowing your pace,  or doing a relaxed purse lip breathing pattern.  - The less you use it, the better it will work when you need it. - Ok to use up to 2 puffs  every 4 hours if you must but call for  appointment if use goes up over your usual need - Don't leave home without it !!  (think of it like a spare tire or starter fluid for your car)     Also  Ok to try albuterol  15 min before an activity (on alternating days)  that you know would usually make you short of breath and see if it makes any difference and if makes none then don't take albuterol  after activity unless you can't catch your breath as this means it's the resting that helps, not the albuterol .      Pantoprazole (protonix) 40 mg   Take  30-60 min before first meal of the day and Pepcid (famotidine)  20 mg after supper   - this is the best way to tell whether stomach acid is contributing to your problem.     GERD (REFLUX)  is an extremely common cause of respiratory symptoms just like yours , many times with no obvious heartburn at all.    It can be treated with medication, but also with lifestyle changes including elevation of the head of your bed (ideally with 6 -8inch blocks under the headboard of your bed),  Smoking cessation, avoidance of late meals, excessive alcohol, and avoid fatty foods, chocolate, peppermint, colas, red wine, and acidic juices such as orange juice.  NO MINT OR MENTHOL PRODUCTS SO NO COUGH DROPS  USE SUGARLESS CANDY INSTEAD (Jolley ranchers or Stover's or Life Savers) or even ice chips will also do - the key is to swallow to prevent all  throat clearing. NO OIL BASED VITAMINS - use powdered substitutes.  Avoid fish oil when coughing.   Contact your heart doctor about your cardiac evaluation asap   The key is to stop smoking completely before smoking completely stops you!  For drainage / throat tickle try take CHLORPHENIRAMINE  4 mg  (Allergy Relief 4mg   at [facility] should be easiest to find in the blue box usually on bottom shelf)  take one every 4 hours as needed - extremely effective and inexpensive over the counter- may cause drowsiness so start with just a dose or two an hour before bedtime and see how you tolerate it before trying in daytime.   Pulmonary follow up is as needed

## 2024-07-22 NOTE — Progress Notes (Unsigned)
 Subjective:     Patient ID: Kimberly Green, female   DOB: [birth year],     MRN: [id number]  HPI  [id number] active smoker as long as she can remember having attacks called laryngospasms resolves with or without treatment abruptly but starting gaining wt from baseline 140-150 then 2011 more steadily downhill with doe x can't ever do grocery shopping anymore  assoc  With intermittent cough since about same period of time so referred to pulmonary clinic 03/15/2018 by Dr   Jerrell    03/15/2018 1st [facility] office visit/ Riyanshi Wahab   Chief Complaint  Patient presents with   Pulmonary Consult    Referred by Dr. Jerrell.  Pt c/o cough off and on since 2011. She states she was dxed with COPD. She has been coughing consistantly for the past 6 wks I have a cold- prod with green sputum.  She states cough sometimes wakes her up in the night. She rarely uses her albuterol  inhaler or neb.    maint off of all inhaled resp rx due to severity of laryngospasm last used albuterol  April 2019  Sleeps in recliner R side and 45 degrees otherwise smothers/ chokes Green mucus in am they are calling me in levaquin  Gabapentin  started  Mar 02 2018 and helping Off lisinopril  since 12/2015  Rec Increase gabapentin  to 200 mg three times a day for a week then 300 mg three times a day  The key is to stop smoking completely before smoking completely stops you - it's not too late Please schedule a follow up office visit in 6 weeks, call sooner if needed with all medications /inhalers/ solutions in hand     07/22/2024 Re- establish  ov/[place] office/Wynonna Fitzhenry re: cough x 2011  maint ventolin  hfa/ ran out of duonbeb  but still has alb neb x  still smoker  did  bring meds / finished pred/ abx 3 days prior to OV   Chief Complaint  Patient presents with   Establish Care    Shob - on antibx and preds - coughing with yellow mucus   Dyspnea:  50 ft on better days  Cough: variable  Sleeping: recliner  x 60 degrees   02:  none Heart racing since Aug 25th     No obvious day to day or daytime variability or assoc excess/ purulent sputum or mucus plugs or hemoptysis or cp or chest tightness, subjective wheeze or overt sinus or hb symptoms.    Also denies any obvious fluctuation of symptoms with weather or environmental changes or other aggravating or alleviating factors except as outlined above   No unusual exposure hx or h/o childhood pna/ asthma or knowledge of premature birth.  Current Allergies, Complete Past Medical History, Past Surgical History, Family History, and Social History were reviewed in [employer] record.  ROS  The following are not active complaints unless bolded Hoarseness, sore throat, dysphagia, dental problems, itching, sneezing,  nasal congestion or discharge of excess mucus or purulent secretions, ear ache,   fever, chills, sweats, unintended wt loss or wt gain, classically pleuritic or exertional cp,  orthopnea pnd or arm/hand swelling  or leg swelling, presyncope, palpitations, abdominal pain, anorexia, nausea, vomiting, diarrhea  or change in bowel habits or change in bladder habits, change in stools or change in urine, dysuria, hematuria,  rash, arthralgias, visual complaints, headache, numbness, weakness or ataxia or problems with walking or coordination,  change in mood or  memory.  Current Meds  Medication Sig   albuterol  (PROVENTIL ) (2.5 MG/3ML) 0.083% nebulizer solution inhale THE contents of one vial via nebulizer EVERY 6 HOURS AS NEEDED   albuterol  (VENTOLIN  HFA) 108 (90 Base) MCG/ACT inhaler Inhale 1-2 puffs into the lungs every 6 (six) hours as needed for wheezing or shortness of breath.   atorvastatin  (LIPITOR) 20 MG tablet Take 1 tablet (20 mg total) by mouth daily.   buPROPion (WELLBUTRIN XL) 150 MG 24 hr tablet Take 150 mg by mouth daily.   dapagliflozin  propanediol (FARXIGA ) 10 MG TABS tablet Take 1 tablet (10 mg total) by mouth daily.    ipratropium-albuterol  (DUONEB) 0.5-2.5 (3) MG/3ML SOLN Take 3 mLs by nebulization every 6 (six) hours as needed.   metFORMIN  (GLUCOPHAGE ) 1000 MG tablet TAKE 1 TABLET BY MOUTH TWICE DAILY WITH A MEAL   QUEtiapine  (SEROQUEL ) 100 MG tablet Take 2 tablets (200 mg total) by mouth at bedtime.   Respiratory Therapy Supplies (NEBULIZER MASK ADULT) MISC 1 each by Does not apply route as needed.   Respiratory Therapy Supplies (NEBULIZER/TUBING/MOUTHPIECE) KIT 1 each by Does not apply route as needed.   salicylic acid -lactic acid 17 % external solution Apply topically daily.                        Objective:   Physical Exam   Wts  07/22/2024     ***   03/15/18 259 lb (117.5 kg)  03/02/18 257 lb (116.6 kg)  11/06/17 263 lb (119.3 kg)    Vital signs reviewed  07/22/2024  - Note at rest 02 sats  ***% on ***   General appearance:    frustrated amb wf tussionex only works       No longer cough reflex R > L ear / only two lower teeth remaining ***            Assessment:

## 2024-07-23 ENCOUNTER — Telehealth: Payer: Self-pay

## 2024-07-23 NOTE — Assessment & Plan Note (Addendum)
 Assoc with cough on manipulation of ext ear canals R > L  Gabapentin  100 tid since Mar 02 2016 increased to 200 tid 03/15/2018 and then p one week 300 tid if tol - reports 07/22/2024 gabapentin  not effective  >>>   07/22/2024 rec ENT eval and in meantime resume max gerd rx and 1st gen H1 blockers per guidelines   Comment: Upper airway cough syndrome (previously labeled PNDS),  is so named because it's frequently impossible to sort out how much is  CR/sinusitis with freq throat clearing (which can be related to primary GERD)   vs  causing  secondary ( extra esophageal)  GERD from wide swings in gastric pressure that occur with throat clearing, often  promoting self use of mint and menthol lozenges that reduce the lower esophageal sphincter tone and exacerbate the problem further in a cyclical fashion.   These are the same pts (now being labeled as having irritable larynx syndrome by some cough centers) who not infrequently have a history of having failed to tolerate ace inhibitors,  dry powder inhalers or biphosphonates or report having atypical/extraesophageal reflux symptoms from LPR (globus, throat clearing)  that don't respond to standard doses of PPI  and are easily confused as having aecopd or asthma flares by even experienced allergists/ pulmonologists (myself included).   Also advised: The standardized cough guidelines published in Chest by Charlie Coder in 2006 are still the best available and consist of a multiple step process (up to 12!) , not a single office visit,  and are intended  to address this problem logically,  with an alogrithm dependent on response to empiric treatment at  each progressive step  to determine a specific diagnosis with  minimal addtional testing needed. Therefore if adherence is an issue or can't be accurately verified,  it's very unlikely the standard evaluation and treatment will be successful here.    Furthermore, response to therapy (other than acute cough  suppression, which should only be used short term with avoidance of narcotic containing cough syrups if possible), can be a gradual process for which the patient is not likely to  perceive immediate benefit.  Unlike going to an eye doctor where the best perscription is almost always the first one and is immediately effective, this is almost never the case in the management of chronic cough syndromes. Therefore the patient needs to commit up front to consistently adhere to recommendations  for up to 6 weeks of therapy directed at the likely underlying problem(s) before the response can be reasonably evaluated.   If f/u here should return with all meds in hand using a trust but verify approach to confirm accurate Medication  Reconciliation The principal here is that until we are certain that the  patients are doing what we've asked, it makes no sense to ask them to do more.          Each maintenance medication was reviewed in detail including emphasizing most importantly the difference between maintenance and prns and under what circumstances the prns are to be triggered using an action plan format where appropriate.  Total time for H and P, chart review, counseling, reviewing neb device(s) and generating customized AVS unique to this office visit / same day charting = 50 min with pt not seen > 3 y with multiple  refractory respiratory  symptoms of uncertain etiology

## 2024-07-23 NOTE — Assessment & Plan Note (Addendum)
 Active smoker 03/15/2018  Walked RA x 3 laps @ 185 ft each stopped due to  End of study, fast pace,sob at end but no  desat    -  Spirometry 03/15/2018  FEV1 1.41 (52%)  Ratio 83 but only blew out 4.5 sec / min curvature   - And CT s contrast  06/05/24  1.    No acute thoracic abnormalities.  2.    Mild paraseptal and centrilobular emphysema.  3.    Atherosclerosis.  4.    Fat-containing superior midline hernia.   Needs pfts at some point though clinically her symptoms mostly  upper airway and tussionex is the only thing that helps her breathing and is obviously not treating her lower airways.  >>> can try alb/ bud 025 up to every 4 h prn (the neb equivalent to air supra) but no no need for maint rx for now   >>> strongly advised to stop all smoking.

## 2024-07-23 NOTE — Telephone Encounter (Signed)
 Copied from CRM [id number]. Topic: Clinical - Medical Advice >> Jul 23, 2024  8:46 AM Nathanel DEL wrote: Reason for CRM: pt saw Dr Darlean for the first time yesterday, and forgot to tell him she is on Ozempic 1 mg 1 X /week. Dr Darlean started her on 3 new meds yesterday.  Pt would like to confirm these are Ok to take along w/ her Ozempic. [phone]  Please advise

## 2024-07-24 NOTE — Telephone Encounter (Signed)
 Informed pt .

## 2024-08-12 ENCOUNTER — Institutional Professional Consult (permissible substitution) (INDEPENDENT_AMBULATORY_CARE_PROVIDER_SITE_OTHER)

## 2024-09-09 ENCOUNTER — Institutional Professional Consult (permissible substitution) (INDEPENDENT_AMBULATORY_CARE_PROVIDER_SITE_OTHER): Payer: MEDICAID

## 2024-10-01 ENCOUNTER — Institutional Professional Consult (permissible substitution) (INDEPENDENT_AMBULATORY_CARE_PROVIDER_SITE_OTHER): Payer: MEDICAID

## 2024-10-01 ENCOUNTER — Other Ambulatory Visit: Payer: Self-pay | Admitting: Internal Medicine
